# Patient Record
Sex: Male | Born: 1960 | Race: White | Hispanic: No | Smoking: Current every day smoker
Health system: Southern US, Community
[De-identification: ages and names within clinical notes are randomized; demographics above are authoritative.]

## PROBLEM LIST (undated history)

## (undated) DIAGNOSIS — F319 Bipolar disorder, unspecified: Secondary | ICD-10-CM

## (undated) DIAGNOSIS — B182 Chronic viral hepatitis C: Secondary | ICD-10-CM

## (undated) DIAGNOSIS — F329 Major depressive disorder, single episode, unspecified: Secondary | ICD-10-CM

## (undated) DIAGNOSIS — B1920 Unspecified viral hepatitis C without hepatic coma: Secondary | ICD-10-CM

## (undated) DIAGNOSIS — I10 Essential (primary) hypertension: Secondary | ICD-10-CM

## (undated) DIAGNOSIS — E119 Type 2 diabetes mellitus without complications: Secondary | ICD-10-CM

## (undated) DIAGNOSIS — T1491XA Suicide attempt, initial encounter: Secondary | ICD-10-CM

## (undated) DIAGNOSIS — Z5900 Homelessness unspecified: Secondary | ICD-10-CM

## (undated) DIAGNOSIS — F32A Depression, unspecified: Secondary | ICD-10-CM

## (undated) DIAGNOSIS — G8929 Other chronic pain: Secondary | ICD-10-CM

## (undated) DIAGNOSIS — I456 Pre-excitation syndrome: Secondary | ICD-10-CM

## (undated) HISTORY — PX: CHEST SURGERY: SHX595

## (undated) HISTORY — PX: BACK SURGERY: SHX140

## (undated) HISTORY — PX: TONSILLECTOMY: SUR1361

## (undated) HISTORY — PX: RHINOPLASTY: SHX2354

---

## 2007-04-12 ENCOUNTER — Ambulatory Visit: Payer: Self-pay | Admitting: Gastroenterology

## 2007-09-08 ENCOUNTER — Ambulatory Visit: Payer: Self-pay | Admitting: Internal Medicine

## 2008-02-22 ENCOUNTER — Emergency Department (HOSPITAL_COMMUNITY): Admission: EM | Admit: 2008-02-22 | Discharge: 2008-02-22 | Payer: Self-pay | Admitting: Emergency Medicine

## 2008-04-17 ENCOUNTER — Emergency Department (HOSPITAL_COMMUNITY): Admission: EM | Admit: 2008-04-17 | Discharge: 2008-04-17 | Payer: Self-pay | Admitting: Emergency Medicine

## 2009-04-25 ENCOUNTER — Inpatient Hospital Stay (HOSPITAL_COMMUNITY): Admission: EM | Admit: 2009-04-25 | Discharge: 2009-04-27 | Payer: Self-pay | Admitting: Emergency Medicine

## 2009-04-27 ENCOUNTER — Ambulatory Visit: Payer: Self-pay | Admitting: Psychiatry

## 2009-04-27 ENCOUNTER — Inpatient Hospital Stay (HOSPITAL_COMMUNITY): Admission: AD | Admit: 2009-04-27 | Discharge: 2009-05-04 | Payer: Self-pay | Admitting: Psychiatry

## 2010-02-23 ENCOUNTER — Inpatient Hospital Stay (HOSPITAL_COMMUNITY): Admission: AC | Admit: 2010-02-23 | Discharge: 2010-02-25 | Payer: Self-pay

## 2010-03-25 ENCOUNTER — Encounter: Admission: RE | Admit: 2010-03-25 | Discharge: 2010-03-25 | Payer: Self-pay | Admitting: Neurosurgery

## 2010-04-16 ENCOUNTER — Ambulatory Visit: Payer: Self-pay | Admitting: Psychiatry

## 2010-05-03 ENCOUNTER — Encounter
Admission: RE | Admit: 2010-05-03 | Discharge: 2010-05-03 | Payer: Self-pay | Source: Home / Self Care | Admitting: Nephrology

## 2010-06-02 ENCOUNTER — Emergency Department (HOSPITAL_COMMUNITY)
Admission: EM | Admit: 2010-06-02 | Discharge: 2010-06-03 | Disposition: A | Discharge status: Other Acute Inpatient Hospital | Payer: Medicare Other | Source: Home / Self Care | Admitting: Emergency Medicine

## 2010-06-03 ENCOUNTER — Inpatient Hospital Stay (HOSPITAL_COMMUNITY)
Admission: EM | Admit: 2010-06-03 | Discharge: 2010-06-07 | Payer: Self-pay | Source: Home / Self Care | Attending: Psychiatry | Admitting: Psychiatry

## 2010-06-05 ENCOUNTER — Ambulatory Visit (HOSPITAL_COMMUNITY)
Admission: RE | Admit: 2010-06-05 | Discharge: 2010-06-05 | Payer: Self-pay | Source: Home / Self Care | Attending: Psychiatry | Admitting: Psychiatry

## 2010-06-05 LAB — GLUCOSE, CAPILLARY
Glucose-Capillary: 153 mg/dL — ABNORMAL HIGH (ref 70–99)
Glucose-Capillary: 131 mg/dL — ABNORMAL HIGH (ref 70–99)
Glucose-Capillary: 117 mg/dL — ABNORMAL HIGH (ref 70–99)
Glucose-Capillary: 168 mg/dL — ABNORMAL HIGH (ref 70–99)

## 2010-06-06 LAB — GLUCOSE, CAPILLARY
Glucose-Capillary: 98 mg/dL (ref 70–99)
Glucose-Capillary: 131 mg/dL — ABNORMAL HIGH (ref 70–99)
Glucose-Capillary: 143 mg/dL — ABNORMAL HIGH (ref 70–99)
Glucose-Capillary: 132 mg/dL — ABNORMAL HIGH (ref 70–99)

## 2010-06-17 LAB — GLUCOSE, CAPILLARY
Glucose-Capillary: 108 mg/dL — ABNORMAL HIGH (ref 70–99)
Glucose-Capillary: 129 mg/dL — ABNORMAL HIGH (ref 70–99)

## 2010-06-22 ENCOUNTER — Encounter: Payer: Self-pay | Admitting: Neurosurgery

## 2010-08-12 LAB — CBC
HCT: 44.7 % (ref 39.0–52.0)
Hemoglobin: 15.9 g/dL (ref 13.0–17.0)
MCH: 35.7 pg — ABNORMAL HIGH (ref 26.0–34.0)
MCHC: 35.6 g/dL (ref 30.0–36.0)
MCV: 100.2 fL — ABNORMAL HIGH (ref 78.0–100.0)
Platelets: 220 10*3/uL (ref 150–400)
RBC: 4.46 MIL/uL (ref 4.22–5.81)
RDW: 12.3 % (ref 11.5–15.5)
WBC: 8.4 10*3/uL (ref 4.0–10.5)

## 2010-08-12 LAB — DIFFERENTIAL
Basophils Relative: 1 % (ref 0–1)
Eosinophils Absolute: 0.1 10*3/uL (ref 0.0–0.7)
Eosinophils Relative: 1 % (ref 0–5)
Lymphs Abs: 3.2 10*3/uL (ref 0.7–4.0)
Monocytes Relative: 8 % (ref 3–12)
Neutrophils Relative %: 53 % (ref 43–77)

## 2010-08-12 LAB — ETHANOL: Alcohol, Ethyl (B): 170 mg/dL — ABNORMAL HIGH (ref 0–10)

## 2010-08-12 LAB — COMPREHENSIVE METABOLIC PANEL
ALT: 47 U/L (ref 0–53)
AST: 59 U/L — ABNORMAL HIGH (ref 0–37)
Alkaline Phosphatase: 58 U/L (ref 39–117)
CO2: 25 mEq/L (ref 19–32)
Calcium: 9.4 mg/dL (ref 8.4–10.5)
GFR calc Af Amer: >60 mL/min (ref >60)
GFR calc non Af Amer: >60 mL/min (ref >60)
Glucose, Bld: 83 mg/dL (ref 70–99)
Potassium: 3.9 mEq/L (ref 3.5–5.1)
Sodium: 143 mEq/L (ref 135–145)
Total Protein: 8.7 g/dL — ABNORMAL HIGH (ref 6.0–8.3)

## 2010-08-12 LAB — RAPID URINE DRUG SCREEN, HOSP PERFORMED
Amphetamines: NOT DETECTED
Barbiturates: NOT DETECTED
Benzodiazepines: NOT DETECTED
Cocaine: POSITIVE — AB
Opiates: NOT DETECTED
Tetrahydrocannabinol: NOT DETECTED

## 2010-08-12 LAB — GLUCOSE, CAPILLARY
Glucose-Capillary: 103 mg/dL — ABNORMAL HIGH (ref 70–99)
Glucose-Capillary: 107 mg/dL — ABNORMAL HIGH (ref 70–99)
Glucose-Capillary: 118 mg/dL — ABNORMAL HIGH (ref 70–99)
Glucose-Capillary: 134 mg/dL — ABNORMAL HIGH (ref 70–99)
Glucose-Capillary: 178 mg/dL — ABNORMAL HIGH (ref 70–99)
Glucose-Capillary: 181 mg/dL — ABNORMAL HIGH (ref 70–99)
Glucose-Capillary: 80 mg/dL (ref 70–99)
Glucose-Capillary: 94 mg/dL (ref 70–99)

## 2010-08-15 LAB — URINALYSIS, MICROSCOPIC ONLY
Nitrite: NEGATIVE
Protein, ur: NEGATIVE mg/dL
Urobilinogen, UA: 1 mg/dL (ref 0.0–1.0)

## 2010-08-15 LAB — GLUCOSE, CAPILLARY
Glucose-Capillary: 119 mg/dL — ABNORMAL HIGH (ref 70–99)
Glucose-Capillary: 131 mg/dL — ABNORMAL HIGH (ref 70–99)
Glucose-Capillary: 92 mg/dL (ref 70–99)
Glucose-Capillary: 99 mg/dL (ref 70–99)

## 2010-08-15 LAB — TYPE AND SCREEN
ABO/RH(D): A POS
Antibody Screen: NEGATIVE

## 2010-08-15 LAB — CBC
HCT: 44.8 % (ref 39.0–52.0)
Hemoglobin: 14.6 g/dL (ref 13.0–17.0)
MCH: 34.6 pg — ABNORMAL HIGH (ref 26.0–34.0)
MCHC: 35.3 g/dL (ref 30.0–36.0)
MCV: 98 fL (ref 78.0–100.0)
Platelets: 211 10*3/uL (ref 150–400)
RBC: 4.3 MIL/uL (ref 4.22–5.81)
RDW: 14.1 % (ref 11.5–15.5)

## 2010-08-15 LAB — BASIC METABOLIC PANEL
CO2: 27 mEq/L (ref 19–32)
Calcium: 8.6 mg/dL (ref 8.4–10.5)
GFR calc Af Amer: >60 mL/min (ref >60)
GFR calc non Af Amer: >60 mL/min (ref >60)
Sodium: 140 mEq/L (ref 135–145)

## 2010-08-15 LAB — COMPREHENSIVE METABOLIC PANEL
Alkaline Phosphatase: 68 U/L (ref 39–117)
BUN: 3 mg/dL — ABNORMAL LOW (ref 6–23)
Calcium: 8.9 mg/dL (ref 8.4–10.5)
Creatinine, Ser: 0.87 mg/dL (ref 0.4–1.5)
Glucose, Bld: 141 mg/dL — ABNORMAL HIGH (ref 70–99)
Potassium: 3.3 mEq/L — ABNORMAL LOW (ref 3.5–5.1)
Total Protein: 6.9 g/dL (ref 6.0–8.3)

## 2010-08-15 LAB — PROTIME-INR
INR: 0.91 (ref 0.00–1.49)
Prothrombin Time: 12.5 seconds (ref 11.6–15.2)

## 2010-08-15 LAB — APTT: aPTT: 23 seconds — ABNORMAL LOW (ref 24–37)

## 2010-09-03 LAB — BASIC METABOLIC PANEL
BUN: 7 mg/dL (ref 6–23)
CO2: 30 mEq/L (ref 19–32)
Calcium: 9.6 mg/dL (ref 8.4–10.5)
Glucose, Bld: 149 mg/dL — ABNORMAL HIGH (ref 70–99)
Sodium: 140 mEq/L (ref 135–145)

## 2010-09-03 LAB — GLUCOSE, CAPILLARY
Glucose-Capillary: 135 mg/dL — ABNORMAL HIGH (ref 70–99)
Glucose-Capillary: 137 mg/dL — ABNORMAL HIGH (ref 70–99)
Glucose-Capillary: 163 mg/dL — ABNORMAL HIGH (ref 70–99)
Glucose-Capillary: 256 mg/dL — ABNORMAL HIGH (ref 70–99)
Glucose-Capillary: 376 mg/dL — ABNORMAL HIGH (ref 70–99)

## 2010-09-04 LAB — GLUCOSE, CAPILLARY
Glucose-Capillary: 115 mg/dL — ABNORMAL HIGH (ref 70–99)
Glucose-Capillary: 127 mg/dL — ABNORMAL HIGH (ref 70–99)
Glucose-Capillary: 159 mg/dL — ABNORMAL HIGH (ref 70–99)
Glucose-Capillary: 170 mg/dL — ABNORMAL HIGH (ref 70–99)
Glucose-Capillary: 170 mg/dL — ABNORMAL HIGH (ref 70–99)
Glucose-Capillary: 82 mg/dL (ref 70–99)
Glucose-Capillary: 89 mg/dL (ref 70–99)
Glucose-Capillary: 162 mg/dL — ABNORMAL HIGH (ref 70–99)
Glucose-Capillary: 180 mg/dL — ABNORMAL HIGH (ref 70–99)
Glucose-Capillary: 199 mg/dL — ABNORMAL HIGH (ref 70–99)
Glucose-Capillary: 242 mg/dL — ABNORMAL HIGH (ref 70–99)
Glucose-Capillary: 261 mg/dL — ABNORMAL HIGH (ref 70–99)
Glucose-Capillary: 305 mg/dL — ABNORMAL HIGH (ref 70–99)

## 2010-09-04 LAB — CBC
MCV: 92.8 fL (ref 78.0–100.0)
MCV: 93.3 fL (ref 78.0–100.0)
Platelets: 124 10*3/uL — ABNORMAL LOW (ref 150–400)
Platelets: 145 10*3/uL — ABNORMAL LOW (ref 150–400)
RBC: 5.04 MIL/uL (ref 4.22–5.81)
RDW: 12.6 % (ref 11.5–15.5)
WBC: 7.8 10*3/uL (ref 4.0–10.5)
WBC: 7.8 10*3/uL (ref 4.0–10.5)
WBC: 9.1 10*3/uL (ref 4.0–10.5)

## 2010-09-04 LAB — DIFFERENTIAL
Basophils Relative: 1 % (ref 0–1)
Lymphocytes Relative: 30 % (ref 12–46)
Lymphs Abs: 2.3 10*3/uL (ref 0.7–4.0)
Monocytes Relative: 6 % (ref 3–12)
Neutro Abs: 4.9 10*3/uL (ref 1.7–7.7)
Neutrophils Relative %: 62 % (ref 43–77)

## 2010-09-04 LAB — BASIC METABOLIC PANEL
BUN: 16 mg/dL (ref 6–23)
BUN: 22 mg/dL (ref 6–23)
Calcium: 8.7 mg/dL (ref 8.4–10.5)
Chloride: 108 mEq/L (ref 96–112)
Chloride: 99 mEq/L (ref 96–112)
Creatinine, Ser: 1.08 mg/dL (ref 0.4–1.5)
Creatinine, Ser: 1.49 mg/dL (ref 0.4–1.5)
Creatinine, Ser: 1.65 mg/dL — ABNORMAL HIGH (ref 0.4–1.5)
GFR calc Af Amer: 54 mL/min — ABNORMAL LOW (ref >60)
GFR calc non Af Amer: 45 mL/min — ABNORMAL LOW (ref >60)
GFR calc non Af Amer: >60 mL/min (ref >60)
Glucose, Bld: 77 mg/dL (ref 70–99)
Sodium: 124 mEq/L — ABNORMAL LOW (ref 135–145)

## 2010-09-04 LAB — RAPID URINE DRUG SCREEN, HOSP PERFORMED
Amphetamines: NOT DETECTED
Tetrahydrocannabinol: NOT DETECTED

## 2010-09-04 LAB — ETHANOL: Alcohol, Ethyl (B): <5 mg/dL (ref 0–10)

## 2010-10-15 NOTE — Letter (Signed)
September 08, 2007    Billy [name]   RE:  Billy [name], Billy [name]  MRN:  [id number]  /  DOB:  1960/08/12   Dear Dr. Celene Skeen:   I thank you for referring Mr. Billy [name] back for EP evaluation.  As  you know, he is a very pleasant middle-aged man with a history of  multiple medical problems, including WPW syndrome and SVT.  The patient  underwent electrophysiologic study and catheter ablation by my partner,  Dr. Graciela Husbands in 1995.  At that time, he was found to have inducible AV node  reentry tachycardia, as well as an accessory pathway which was not  contributing to his SVT.  He underwent successful ablation of his AV  node reentry tachycardia with no therapy being directed at his left  lateral accessory pathway.  Since then, the patient has had no  significant arrhythmias.  He has rare palpitations, but has otherwise  not had any significant arrhythmia.  He does have very rare  nonexertional chest pain, but he notes that he exert himself without any  particular difficulty.  The patient has had multiple social problems and  was actually homeless for 1 month before finding shelter.  He has a  history of diabetes which is non-insulin dependent.  He has a history of  dyslipidemia.  He has a history of hepatitis C with no treatment.  He  has a history of bipolar disorder, but he stopped taking lithium in the  past.  He has a history of multisubstance abuse, including cocaine and  marijuana with positive drug testing back in 2007.  The patient lives  presently in a government housing project.  He drinks 3-4 beers on the  weekends, but not like he used to in terms of drinking heavily.   MEDICATIONS:  1. Byetta 5 mg daily.  2. Levitra p.r.n.  3. Prevacid p.r.n.  4. Welchol 625 t.i.d.  5. Avalide 300/25.  6. Gabapentin 800 b.i.d.   REVIEW OF SYSTEMS:  Really unremarkable, except for rare palpitations,  seasonal allergic rhinitis symptoms and atypical chest pain, not related  to exertion and lasting  seconds.   PHYSICAL EXAMINATION:  GENERAL:  He is a pleasant 50 year old man who is  in no acute distress.  VITAL SIGNS:  Blood pressure was 109/71, pulse was 53 and regular,  respirations were 18.  Weight was 194 pounds.  HEENT:  Normocephalic and atraumatic.  Pupils equal and round.  Oropharynx was moist.  Sclerae anicteric.  NECK:  Revealed no jugulovenous distention.  No thyromegaly.  Trachea is  midline.  The carotids are 2+ and symmetric.  LUNGS:  Clear bilaterally to auscultation.  No wheezes, rales or rhonchi  are present.  There is no increased work of breathing.  CARDIOVASCULAR:  Revealed a regular rate and rhythm with normal S1-S2.  No murmurs, rubs or gallops present.  PMI was not enlarged or laterally  displaced.  ABDOMEN:  Soft, nontender, nondistended.  There is no organomegaly.  Bowel sounds present.  No rebound or guarding.  EXTREMITIES:  Demonstrated no cyanosis, clubbing or edema.  Pulses were  2+ and symmetric.  NEUROLOGIC:  Alert and oriented x3.  Cranial nerves  are intact.  Strength is 5/5 and symmetric.   His EKG demonstrates sinus bradycardia with no evidence of any  ventricular preexcitation.   IMPRESSION:  1. Palpitations with minimal symptoms.  2. History of supraventricular tachycardia, status post ablation.  3. Atypical chest pain with symptoms not consistent with any coronary  disease.   DISCUSSION:  Billy [name] is stable and I have recommended a period of  watchful waiting.  There is no indication for any additional cardiac  workup at the present time.    Sincerely,      Doylene Canning. Ladona Ridgel, MD  Electronically Signed    GWT/MedQ  DD: 09/08/2007  DT: 09/08/2007  Job #: [id number]   CC:    Billy [name]

## 2011-03-03 LAB — CBC
HCT: 46.6
Hemoglobin: 16
MCHC: 34.4
MCV: 98.8
Platelets: 170
RDW: 14.8

## 2011-03-03 LAB — BASIC METABOLIC PANEL
BUN: 6
CO2: 31
Chloride: 104
Glucose, Bld: 90
Potassium: 4.5
Sodium: 142

## 2011-03-03 LAB — DIFFERENTIAL
Basophils Absolute: 0.1
Eosinophils Absolute: 0.1
Eosinophils Relative: 2
Lymphocytes Relative: 50 — ABNORMAL HIGH
Monocytes Absolute: 0.6

## 2011-08-29 ENCOUNTER — Emergency Department (HOSPITAL_COMMUNITY)
Admission: EM | Admit: 2011-08-29 | Discharge: 2011-08-30 | Disposition: A | Discharge status: Other Acute Inpatient Hospital | Payer: Medicare Other | Source: Home / Self Care | Attending: Emergency Medicine | Admitting: Emergency Medicine

## 2011-08-29 ENCOUNTER — Encounter (HOSPITAL_COMMUNITY): Payer: Self-pay | Admitting: *Deleted

## 2011-08-29 DIAGNOSIS — E119 Type 2 diabetes mellitus without complications: Secondary | ICD-10-CM | POA: Insufficient documentation

## 2011-08-29 DIAGNOSIS — F329 Major depressive disorder, single episode, unspecified: Secondary | ICD-10-CM

## 2011-08-29 DIAGNOSIS — M549 Dorsalgia, unspecified: Secondary | ICD-10-CM | POA: Insufficient documentation

## 2011-08-29 DIAGNOSIS — F3130 Bipolar disorder, current episode depressed, mild or moderate severity, unspecified: Secondary | ICD-10-CM | POA: Insufficient documentation

## 2011-08-29 DIAGNOSIS — R739 Hyperglycemia, unspecified: Secondary | ICD-10-CM

## 2011-08-29 DIAGNOSIS — E669 Obesity, unspecified: Secondary | ICD-10-CM | POA: Insufficient documentation

## 2011-08-29 DIAGNOSIS — F319 Bipolar disorder, unspecified: Secondary | ICD-10-CM

## 2011-08-29 HISTORY — DX: Pre-excitation syndrome: I45.6

## 2011-08-29 HISTORY — DX: Depression, unspecified: F32.A

## 2011-08-29 HISTORY — DX: Chronic viral hepatitis C: B18.2

## 2011-08-29 HISTORY — DX: Major depressive disorder, single episode, unspecified: F32.9

## 2011-08-29 HISTORY — DX: Other chronic pain: G89.29

## 2011-08-29 HISTORY — DX: Essential (primary) hypertension: I10

## 2011-08-29 HISTORY — DX: Bipolar disorder, unspecified: F31.9

## 2011-08-29 LAB — COMPREHENSIVE METABOLIC PANEL
ALT: 27 U/L (ref 0–53)
Alkaline Phosphatase: 98 U/L (ref 39–117)
CO2: 22 mEq/L (ref 19–32)
Chloride: 106 mEq/L (ref 96–112)
GFR calc Af Amer: >90 mL/min (ref >90)
Glucose, Bld: 274 mg/dL — ABNORMAL HIGH (ref 70–99)
Potassium: 3.8 mEq/L (ref 3.5–5.1)
Sodium: 138 mEq/L (ref 135–145)
Total Bilirubin: 0.4 mg/dL (ref 0.3–1.2)
Total Protein: 7.3 g/dL (ref 6.0–8.3)

## 2011-08-29 LAB — CBC
HCT: 41 % (ref 39.0–52.0)
MCHC: 35.6 g/dL (ref 30.0–36.0)
MCV: 86.5 fL (ref 78.0–100.0)
Platelets: 184 10*3/uL (ref 150–400)
RDW: 12.6 % (ref 11.5–15.5)
WBC: 7 10*3/uL (ref 4.0–10.5)

## 2011-08-29 LAB — GLUCOSE, CAPILLARY

## 2011-08-29 MED ORDER — LITHIUM CARBONATE ER 300 MG PO TBCR
300.0000 mg | EXTENDED_RELEASE_TABLET | ORAL | Status: AC
  Administered 2011-08-29: 300 mg via ORAL
  Filled 2011-08-29: qty 1

## 2011-08-29 MED ORDER — INSULIN GLARGINE 100 UNIT/ML ~~LOC~~ SOLN
30.0000 [IU] | Freq: Every day | SUBCUTANEOUS | Status: DC
  Administered 2011-08-29: 30 [IU] via SUBCUTANEOUS
  Filled 2011-08-29: qty 1

## 2011-08-29 MED ORDER — LITHIUM CARBONATE ER 300 MG PO TBCR: 300.0000 mg | EXTENDED_RELEASE_TABLET | Freq: Two times a day (BID) | ORAL | Status: DC

## 2011-08-29 MED ORDER — HYDROCODONE-ACETAMINOPHEN 5-325 MG PO TABS
1.0000 | ORAL_TABLET | Freq: Four times a day (QID) | ORAL | Status: DC | PRN
  Administered 2011-08-29: 1 via ORAL
  Filled 2011-08-29: qty 1

## 2011-08-29 MED ORDER — TRAZODONE HCL 100 MG PO TABS
200.0000 mg | ORAL_TABLET | ORAL | Status: AC
  Administered 2011-08-29: 200 mg via ORAL
  Filled 2011-08-29: qty 2

## 2011-08-29 MED ORDER — CITALOPRAM HYDROBROMIDE 40 MG PO TABS: 40.0000 mg | ORAL_TABLET | Freq: Every day | ORAL | Status: DC

## 2011-08-29 MED ORDER — TRAZODONE HCL 100 MG PO TABS: 200.0000 mg | ORAL_TABLET | Freq: Every day | ORAL | Status: DC

## 2011-08-29 MED ORDER — CITALOPRAM HYDROBROMIDE 40 MG PO TABS
40.0000 mg | ORAL_TABLET | ORAL | Status: AC
  Administered 2011-08-29: 40 mg via ORAL
  Filled 2011-08-29: qty 1

## 2011-08-29 NOTE — BH Assessment (Signed)
Assessment Note   Reynold Bowen, assessment counselor at [facility], requested Pt be submitted to [facility] for admission. Gave clinical report to Dr. Orson Aloe who accepted Pt to [facility]. Consulted with Faythe Dingwall, [facility] who assigned Pt to room 302-2. Communicated disposition to [employer].   Patsy Baltimore, Harlin Rain 08/29/2011 9:44 PM

## 2011-08-29 NOTE — Progress Notes (Signed)
Consulted to obtain medication assistance. However, patient has Medicare; therefore, we cannot help him through the [employer]-fund. Social work needs to be consulted with regard to substance abuse and placement issues.

## 2011-08-29 NOTE — ED Notes (Signed)
Report givwen to [employer]

## 2011-08-29 NOTE — ED Notes (Signed)
Patient states he is DM and he usually takes 30 Units of Lantus at night but has not had his Lantus in a week. Patient also states he has chronic back pain and is hurting at this time. EDP advised.

## 2011-08-29 NOTE — Progress Notes (Signed)
I attempted to contact the ED SW but the phone was not on. I contacted operator for on-call SW whom I paged and gave number for the attending nurse.

## 2011-08-29 NOTE — ED Provider Notes (Signed)
History     CSN: [id number]  Arrival date & time 08/29/11  1200   First MD Initiated Contact with Patient 08/29/11 1347      Chief Complaint  Patient presents with  . Medical Clearance    (Consider location/radiation/quality/duration/timing/severity/associated sxs/prior treatment) HPI Comments: 51 yo male presents wanting to be admitted to behavioral health. 1 week ago was released from jail after being charged for attempted murder (was in for 14 months). Had sporadic treatment for his medical conditions, including insulin, lithium, celexa and trazadone. Has been living in a motel for the past week since his release and has been unable to get meds or get an appointment with his PCP (Dr. Bascom Levels). Denies suicidal thoughts or homicidal thoughts, but wants to be admitted to get his meds back intact as well as get a place to stay. Has lower back pain that is chronic but no new injuries.  The history is provided by the patient.    Past Medical History  Diagnosis Date  . Diabetes mellitus   . Bipolar 1 disorder   . Hepatitis C carrier   . WPW (Wolff-Parkinson-White syndrome)   . Hypertension   . Depression   . Chronic pain     Past Surgical History  Procedure Date  . Back surgery   . Rhinoplasty   . Chest surgery   . Tonsillectomy     No family history on file.  History  Substance Use Topics  . Smoking status: Current Some Day Smoker  . Smokeless tobacco: Not on file  . Alcohol Use: Yes      Review of Systems  Constitutional: Positive for unexpected weight change (gained 80 pounds while in jail due to diet and poor exercise). Negative for fever and chills.  HENT: Negative for congestion and rhinorrhea.   Respiratory: Negative for cough and shortness of breath.   Cardiovascular: Negative for chest pain and leg swelling.  Gastrointestinal: Negative for nausea, vomiting, abdominal pain, constipation and blood in stool.  Genitourinary: Negative for dysuria and decreased  urine volume.  Musculoskeletal: Positive for back pain.  Neurological: Negative for numbness and headaches.  Psychiatric/Behavioral: Positive for dysphoric mood. Negative for suicidal ideas, behavioral problems, confusion and self-injury.  All other systems reviewed and are negative.    Allergies  Review of patient's allergies indicates no known allergies.  Home Medications   Current Outpatient Rx  Name Route Sig Dispense Refill  . HYDROCODONE-ACETAMINOPHEN 10-325 MG PO TABS Oral Take 1 tablet by mouth 3 (three) times daily.    Marland Kitchen OMEPRAZOLE 40 MG PO CPDR Oral Take 40 mg by mouth 2 (two) times daily.      BP 140/90  Pulse 90  Temp 98 F (36.7 C)  Resp 20  SpO2 98%  Physical Exam  Nursing note and vitals reviewed. Constitutional: He is oriented to person, place, and time. He appears well-developed and well-nourished.       Well appearing, is laying in exam bed reading a book.  HENT:  Head: Normocephalic and atraumatic.  Right Ear: External ear normal.  Left Ear: External ear normal.  Nose: Nose normal.  Neck: Neck supple.  Cardiovascular: Normal rate, regular rhythm, normal heart sounds and intact distal pulses.   Pulmonary/Chest: Effort normal and breath sounds normal. No respiratory distress. He has no wheezes. He has no rales.  Abdominal: Soft. He exhibits no distension and no mass. There is no tenderness. There is no rebound and no guarding.       obese  Musculoskeletal: He exhibits no edema.  Lymphadenopathy:    He has no cervical adenopathy.  Neurological: He is alert and oriented to person, place, and time.  Skin: Skin is warm and dry.    ED Course  Procedures (including critical care time)  Labs Reviewed  COMPREHENSIVE METABOLIC PANEL - Abnormal; Notable for the following:    Glucose, Bld 274 (*)    All other components within normal limits  GLUCOSE, CAPILLARY - Abnormal; Notable for the following:    Glucose-Capillary 211 (*)    All other components  within normal limits  CBC  ETHANOL  URINE RAPID DRUG SCREEN (HOSP PERFORMED)   No results found.   1. Hyperglycemia   2. Depression       MDM  51 yo male seeking voluntary committment due to social and psych issues. No SI/HI. Social work consulted due to his homelessness, as well as telepsych for med placement. Psychiatrist believes patient should be admitted. Patient's meds started per psych recommendations, as well as insulin for his hyperglycemia. No evidence of DKA. Medically cleared.        Pricilla Loveless, MD 08/29/11 2306
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
## 2011-08-29 NOTE — BH Assessment (Signed)
Assessment Note   Billy [name] was accepted to [facility] by Dr Dan Humphreys to Dr Catha Brow room 302-2 with the request that patient be given a UDS before being transferred.  Reported disposition to Dr Hedwig Morton who ordered the UDS but requested the patient transport not be held until the labs have resulted.  Completed support paperwork and faxed to [facility].  Pt will be transported by security.    Disposition:  Disposition Disposition of Patient: Inpatient treatment program Type of inpatient treatment program: Adult (Accepted to [facility] 302-2 By Dr Dan Humphreys to [facility])  On Site Evaluation by:   Reviewed with Physician:     Steward Ros 08/29/2011 9:58 PM

## 2011-08-29 NOTE — ED Notes (Signed)
Dinner tray ordered. Carb Mod.

## 2011-08-29 NOTE — ED Notes (Addendum)
Patient reports he was in jail for 14 mth.  He states he lost his apartment and meds.  Patient is out of his medications.  He is here for that.  He states he wants to go to behavioral health to get his meds straightened out.  He states he has hx of bipolar with hx of suicide attemtps.  He denies si today.  He states he needs a controlled environment while he gets straightened out

## 2011-08-29 NOTE — ED Notes (Signed)
Patient completed telepsych.  

## 2011-08-29 NOTE — ED Provider Notes (Addendum)
Pt wishing to be started on psychiatric medications.on various vacations while in prison. He was recently released from prison and is currently on no medications. He states "I feel fine" suicidal or homicidal ideations. Medical history remarkable for diabetes off of all medications since release from prison has appointment to see Dr.Frazier next week for evaluation. Dr. Janey Greaser his primary care Dr.. patient reports he was on Lantus insulin 30 units each night  Doug Sou, MD 08/29/11 1618 Patient became argumentative stating if he is discharged "I don't know what I'll do". He feels that he is not safe at home. Inpatient psychiatric care recommended by specialist on-call from psychiatry  Doug Sou, MD 08/30/11 0010  11:50 PM patient is pleasant cooperative alert Glasgow Coma Score 15 ambulatory difficulty. Stable to transfer to [facility]. Results for orders placed during the hospital encounter of 08/29/11  CBC      Component Value Range   WBC 7.0  4.0 - 10.5 (K/uL)   RBC 4.74  4.22 - 5.81 (MIL/uL)   Hemoglobin 14.6  13.0 - 17.0 (g/dL)   HCT 16.1  09.6 - 04.5 (%)   MCV 86.5  78.0 - 100.0 (fL)   MCH 30.8  26.0 - 34.0 (pg)   MCHC 35.6  30.0 - 36.0 (g/dL)   RDW 40.9  81.1 - 91.4 (%)   Platelets 184  150 - 400 (K/uL)  COMPREHENSIVE METABOLIC PANEL      Component Value Range   Sodium 138  135 - 145 (mEq/L)   Potassium 3.8  3.5 - 5.1 (mEq/L)   Chloride 106  96 - 112 (mEq/L)   CO2 22  19 - 32 (mEq/L)   Glucose, Bld 274 (*) 70 - 99 (mg/dL)   BUN 14  6 - 23 (mg/dL)   Creatinine, Ser 7.82  0.50 - 1.35 (mg/dL)   Calcium 9.3  8.4 - 95.6 (mg/dL)   Total Protein 7.3  6.0 - 8.3 (g/dL)   Albumin 3.8  3.5 - 5.2 (g/dL)   AST 24  0 - 37 (U/L)   ALT 27  0 - 53 (U/L)   Alkaline Phosphatase 98  39 - 117 (U/L)   Total Bilirubin 0.4  0.3 - 1.2 (mg/dL)   GFR calc non Af Amer >90  >90 (mL/min)   GFR calc Af Amer >90  >90 (mL/min)  ETHANOL      Component Value Range   Alcohol, Ethyl (B) <11  0 -  11 (mg/dL)  GLUCOSE, CAPILLARY      Component Value Range   Glucose-Capillary 211 (*) 70 - 99 (mg/dL)   Comment 1 Notify RN     Comment 2 Documented in Chart    URINE RAPID DRUG SCREEN (HOSP PERFORMED)      Component Value Range   Opiates PENDING  NONE DETECTED    Cocaine NONE DETECTED  NONE DETECTED    Benzodiazepines NONE DETECTED  NONE DETECTED    Amphetamines NONE DETECTED  NONE DETECTED    Tetrahydrocannabinol NONE DETECTED  NONE DETECTED    Barbiturates NONE DETECTED  NONE DETECTED    No results found.   Doug Sou, MD 08/30/11 Burna Mortimer  Doug Sou, MD 08/30/11 2130

## 2011-08-29 NOTE — ED Notes (Signed)
Pt stated that he has not had his medications since he left jail a couple of months ago. He came to the ED to find housing and structure. He stated that he cannot take care of himself. No SI, hallucinations, or HI. Will continue to monitor.

## 2011-08-29 NOTE — ED Notes (Signed)
Patient resting and reading a book. NAD at this time.

## 2011-08-29 NOTE — ED Notes (Signed)
Carollee Herter, Social worker called and stated that she was at [employer] and she could only fax over a list of homeless shelters for the patient. Fax received.

## 2011-08-30 ENCOUNTER — Inpatient Hospital Stay (HOSPITAL_COMMUNITY)
Admission: AD | Admit: 2011-08-30 | Discharge: 2011-09-03 | DRG: 885 | Disposition: A | Discharge status: Home / Self Care | Payer: Medicare Other | Source: Ambulatory Visit | Attending: Psychiatry | Admitting: Psychiatry

## 2011-08-30 ENCOUNTER — Encounter (HOSPITAL_COMMUNITY): Payer: Self-pay

## 2011-08-30 DIAGNOSIS — I456 Pre-excitation syndrome: Secondary | ICD-10-CM | POA: Diagnosis present

## 2011-08-30 DIAGNOSIS — F17200 Nicotine dependence, unspecified, uncomplicated: Secondary | ICD-10-CM

## 2011-08-30 DIAGNOSIS — Z590 Homelessness: Secondary | ICD-10-CM

## 2011-08-30 DIAGNOSIS — B182 Chronic viral hepatitis C: Secondary | ICD-10-CM

## 2011-08-30 DIAGNOSIS — Z794 Long term (current) use of insulin: Secondary | ICD-10-CM

## 2011-08-30 DIAGNOSIS — F602 Antisocial personality disorder: Secondary | ICD-10-CM

## 2011-08-30 DIAGNOSIS — I10 Essential (primary) hypertension: Secondary | ICD-10-CM

## 2011-08-30 DIAGNOSIS — F3130 Bipolar disorder, current episode depressed, mild or moderate severity, unspecified: Principal | ICD-10-CM

## 2011-08-30 DIAGNOSIS — F1110 Opioid abuse, uncomplicated: Secondary | ICD-10-CM

## 2011-08-30 DIAGNOSIS — E119 Type 2 diabetes mellitus without complications: Secondary | ICD-10-CM

## 2011-08-30 DIAGNOSIS — F329 Major depressive disorder, single episode, unspecified: Secondary | ICD-10-CM | POA: Diagnosis present

## 2011-08-30 DIAGNOSIS — M549 Dorsalgia, unspecified: Secondary | ICD-10-CM

## 2011-08-30 DIAGNOSIS — F1190 Opioid use, unspecified, uncomplicated: Secondary | ICD-10-CM

## 2011-08-30 DIAGNOSIS — F319 Bipolar disorder, unspecified: Secondary | ICD-10-CM | POA: Diagnosis present

## 2011-08-30 DIAGNOSIS — G8929 Other chronic pain: Secondary | ICD-10-CM

## 2011-08-30 LAB — RAPID URINE DRUG SCREEN, HOSP PERFORMED
Amphetamines: NOT DETECTED
Benzodiazepines: NOT DETECTED
Opiates: POSITIVE — AB
Tetrahydrocannabinol: NOT DETECTED

## 2011-08-30 LAB — GLUCOSE, CAPILLARY
Glucose-Capillary: 227 mg/dL — ABNORMAL HIGH (ref 70–99)
Glucose-Capillary: 231 mg/dL — ABNORMAL HIGH (ref 70–99)
Glucose-Capillary: 235 mg/dL — ABNORMAL HIGH (ref 70–99)
Glucose-Capillary: 180 mg/dL — ABNORMAL HIGH (ref 70–99)

## 2011-08-30 MED ORDER — MAGNESIUM HYDROXIDE 400 MG/5ML PO SUSP: 30.0000 mL | Freq: Every day | ORAL | Status: DC | PRN

## 2011-08-30 MED ORDER — IBUPROFEN 600 MG PO TABS
600.0000 mg | ORAL_TABLET | Freq: Four times a day (QID) | ORAL | Status: DC | PRN
  Administered 2011-08-31 – 2011-09-02 (×4): 600 mg via ORAL
  Filled 2011-08-30 (×4): qty 1

## 2011-08-30 MED ORDER — ALUM & MAG HYDROXIDE-SIMETH 200-200-20 MG/5ML PO SUSP: 30.0000 mL | ORAL | Status: DC | PRN

## 2011-08-30 MED ORDER — ACETAMINOPHEN 325 MG PO TABS: 650.0000 mg | ORAL_TABLET | Freq: Four times a day (QID) | ORAL | Status: DC | PRN

## 2011-08-30 MED ORDER — CITALOPRAM HYDROBROMIDE 40 MG PO TABS
40.0000 mg | ORAL_TABLET | Freq: Every day | ORAL | Status: DC
  Administered 2011-08-30 – 2011-09-03 (×5): 40 mg via ORAL
  Filled 2011-08-30 (×7): qty 1

## 2011-08-30 MED ORDER — INSULIN ASPART 100 UNIT/ML ~~LOC~~ SOLN: 0.0000 [IU] | Freq: Every day | SUBCUTANEOUS | Status: DC

## 2011-08-30 MED ORDER — INSULIN ASPART 100 UNIT/ML ~~LOC~~ SOLN
4.0000 [IU] | Freq: Three times a day (TID) | SUBCUTANEOUS | Status: DC
  Administered 2011-08-30 – 2011-09-03 (×12): 4 [IU] via SUBCUTANEOUS

## 2011-08-30 MED ORDER — METAXALONE 800 MG PO TABS
800.0000 mg | ORAL_TABLET | Freq: Four times a day (QID) | ORAL | Status: DC | PRN
  Administered 2011-08-31 – 2011-09-03 (×6): 800 mg via ORAL
  Filled 2011-08-30 (×6): qty 1

## 2011-08-30 MED ORDER — INSULIN GLARGINE 100 UNIT/ML ~~LOC~~ SOLN
30.0000 [IU] | Freq: Every day | SUBCUTANEOUS | Status: DC
  Administered 2011-08-30 – 2011-09-02 (×4): 30 [IU] via SUBCUTANEOUS

## 2011-08-30 MED ORDER — INSULIN ASPART 100 UNIT/ML ~~LOC~~ SOLN
0.0000 [IU] | Freq: Three times a day (TID) | SUBCUTANEOUS | Status: DC
  Administered 2011-08-30: 5 [IU] via SUBCUTANEOUS
  Administered 2011-08-31 (×2): 3 [IU] via SUBCUTANEOUS
  Administered 2011-08-31: 5 [IU] via SUBCUTANEOUS
  Administered 2011-09-01: 3 [IU] via SUBCUTANEOUS
  Administered 2011-09-01: 5 [IU] via SUBCUTANEOUS
  Administered 2011-09-01 – 2011-09-02 (×2): 3 [IU] via SUBCUTANEOUS
  Administered 2011-09-02: 5 [IU] via SUBCUTANEOUS
  Administered 2011-09-02 – 2011-09-03 (×2): 3 [IU] via SUBCUTANEOUS
  Administered 2011-09-03: 5 [IU] via SUBCUTANEOUS

## 2011-08-30 MED ORDER — LITHIUM CARBONATE 300 MG PO CAPS
300.0000 mg | ORAL_CAPSULE | Freq: Two times a day (BID) | ORAL | Status: DC
  Administered 2011-08-30 – 2011-09-03 (×9): 300 mg via ORAL
  Filled 2011-08-30 (×11): qty 1

## 2011-08-30 MED ORDER — TRAZODONE HCL 100 MG PO TABS
200.0000 mg | ORAL_TABLET | Freq: Every day | ORAL | Status: DC
  Administered 2011-08-30 – 2011-09-02 (×4): 200 mg via ORAL
  Filled 2011-08-30 (×5): qty 2

## 2011-08-30 NOTE — Progress Notes (Signed)
Patient ID: Billy [name], male   DOB: 30-Jun-1960, 51 y.o.   MRN: [id number] The patient spent the entire evening in bed. Stated he has had several back surgeries and wants a doctor to order Oxycodone for him. Refused offer of Tylenol and a heat pack.  Requested diabetic ginger ale. Got out of bed and walked to dayroom for snack. Choose appropriate snacks for his diabetic diet.

## 2011-08-30 NOTE — Progress Notes (Signed)
Patient ID: Jaedon Siler, male   DOB: [birth year], 51 y.o.   MRN: [id number]  [facility] Notes:  (Counselor/Nursing/MHT/Case Management/Adjunct)  08/30/2011 1:15 PM  Type of Therapy:  Group Therapy, Dance/Movement Therapy   Participation Level:  Did Not Attend     Rhunette Croft

## 2011-08-30 NOTE — Tx Team (Signed)
Initial Interdisciplinary Treatment Plan  PATIENT STRENGTHS: (choose at least two) Ability for insight Capable of independent living  PATIENT STRESSORS: Financial difficulties Loss of housing* Medication change or noncompliance   PROBLEM LIST: Problem List/Patient Goals Date to be addressed Date deferred Reason deferred Estimated date of resolution  Depression      Medications restarted                                                 DISCHARGE CRITERIA:  Ability to meet basic life and health needs Adequate post-discharge living arrangements Verbal commitment to aftercare and medication compliance  PRELIMINARY DISCHARGE PLAN: Placement in alternative living arrangements  PATIENT/FAMIILY INVOLVEMENT: This treatment plan has been presented to and reviewed with the patient, Billy [name], and/or family member.  The patient and family have been given the opportunity to ask questions and make suggestions.  Billy [name] 08/30/2011, 2:26 AM

## 2011-08-30 NOTE — ED Provider Notes (Signed)
I have personally seen and examined the patient.  I have discussed the plan of care with the resident.  I have reviewed the documentation on PMH/FH/Soc. History.  I have reviewed the documentation of the resident and agree.  Doug Sou, MD 08/30/11 [id number]

## 2011-08-30 NOTE — Progress Notes (Signed)
Patient reports depression, needing help with finding placement and medications restarted. Patient reports he was recently released from jail 1 week ago after being cleared of charges. Patient reports he lost his apartment while in jail and his belongings. Patient reported chronic back pain which he rated a 7 and declined tylenol and ibuprofen offered upon admission. Denies si/hi/a/v hallucinations. Patient had medications in his possessions and one of them was oxycodone which was filled on 08/27/2011 for 90 pill, patient had 25 upon admission, he reports that his ex-wife holds on to them for him. Patient oriented to unit, meal and drink offered, safety maintained with 15 min. checks, will continue to monitor.

## 2011-08-30 NOTE — [facility] Suicide Risk Assessment (Signed)
Suicide Risk Assessment  Admission Assessment     Demographic factors:  Assessment Details Time of Assessment: Admission Current Mental Status:     Objective: Appearance: Neat   Psychomotor Activity: Normal   Eye Contact:: Fair   Speech: Clear and Coherent   Volume: Normal   Mood:depressed   Affect: Constricted   Thought Process: clear rational goal oriented   Orientation: Full   Thought Content: no AVH/psychosis   Suicidal Thoughts: No   Homicidal Thoughts: No   Judgement: Intact   Insight: Present    Loss Factors:  Loss Factors: Decrease in vocational status;Financial problems / change in socioeconomic status, homeless, disabled Historical Factors:  Historical Factors: Prior suicide attempts Risk Reduction Factors:  Risk Reduction Factors: Sense of responsibility to family  CLINICAL FACTORS:   Alcohol/Substance Abuse/Dependencies  COGNITIVE FEATURES THAT CONTRIBUTE TO RISK:  Closed-mindedness    SUICIDE RISK:   Mild:  Suicidal ideation of limited frequency, intensity, duration, and specificity.  There are no identifiable plans, no associated intent, mild dysphoria and related symptoms, good self-control (both objective and subjective assessment), few other risk factors, and identifiable protective factors, including available and accessible social support.  PLAN OF CARE:  Admit for safety & stabilization-homeless  Need his DM addressed-prefers Lantus  Will try to verify meds given while in jail  Restart Celexa trazadone and Lithium-already done      Billy [name] 08/30/2011, 11:18 AM

## 2011-08-30 NOTE — H&P (Signed)
  Pt was seen by me today and I agree with the key elements documented in H&P.  

## 2011-08-30 NOTE — Progress Notes (Signed)
Patient ID: Billy [name], male   DOB: [birth year], 51 y.o.   MRN: [id number]   Pt has been very flat and depressed on the unit today, pt was in the bed most of the day. Pt did not attend groups, nor did he engage in treatment. Pt reported that he needed to be seen by the doctor so that he could be put back on all of his medication, Mallie Darting made aware. Pt is diabetic and has had some high blood sugars, currently he is on Lantus insulin at bedtime. Pt reported being negative SI/HI, no AH/VH  Noted.
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
## 2011-08-31 LAB — GLUCOSE, CAPILLARY: Glucose-Capillary: 196 mg/dL — ABNORMAL HIGH (ref 70–99)

## 2011-08-31 NOTE — Progress Notes (Signed)
[facility] Adult Inpatient Family/Significant Other Suicide Prevention Education  Suicide Prevention Education:  Patient Refusal for Family/Significant Other Suicide Prevention Education: The patient Billy [name] has refused to provide written consent for family/significant other to be provided Family/Significant Other Suicide Prevention Education during admission and/or prior to discharge.  Physician notified.   Pt. accepted information on suicide prevention, warning signs to look for with suicide and crisis line numbers to use. The pt. agreed to call crisis line numbers if having warning signs or having thoughts of suicide.   [facility] 08/31/2011, 1:59 PM
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
## 2011-08-31 NOTE — Progress Notes (Signed)
[facility] Group Notes:  (Counselor/Nursing/MHT/Case Management/Adjunct)  08/31/2011 1:15 PM  Type of Therapy:  Group Therapy, Dance/Movement Therapy   Participation Level:  Did Not Attend    Billy [name]  

## 2011-08-31 NOTE — Progress Notes (Signed)
  Billy [name] is a 51 y.o. male [id number] Mar 19, 1961  08/30/2011 Principal Problem:  *Depression Active Problems:  Opiate use  Bipolar affective disorder  Diabetes mellitus  WPW (Wolff-Parkinson-White syndrome)   Mental Status: Alert & oriented mood is sullen entitled denies SI/HI/AVH    Subjective/Objective: C/o back pain- told him that no opioids would be prescribed here. Explained to him that he had misrepresented himself in the ED claiming that he couldn't get access to his [facility]. Indeed he had a Rx on 3/27 for 90 Hydrocodone and when admitted to [facility] had 25 left. Probably sold them to raise cash as he has not exhibted withdrawal.     Filed Vitals:   08/30/11 2116  BP: 141/96  Pulse: 79  Temp:   Resp:     Lab Results:   BMET    Component Value Date/Time   NA 138 08/29/2011 1222   K 3.8 08/29/2011 1222   CL 106 08/29/2011 1222   CO2 22 08/29/2011 1222   GLUCOSE 274* 08/29/2011 1222   BUN 14 08/29/2011 1222   CREATININE 0.88 08/29/2011 1222   CALCIUM 9.3 08/29/2011 1222   GFRNONAA >90 08/29/2011 1222   GFRAA >90 08/29/2011 1222    Medications:  Scheduled:     . citalopram  40 mg Oral Daily  . insulin aspart  0-15 Units Subcutaneous TID WC  . insulin aspart  0-5 Units Subcutaneous QHS  . insulin aspart  4 Units Subcutaneous TID WC  . insulin glargine  30 Units Subcutaneous QHS  . lithium carbonate  300 mg Oral BID  . traZODone  200 mg Oral QHS     PRN Meds acetaminophen, alum & mag hydroxide-simeth, ibuprofen, magnesium hydroxide, metaxalone  Plan: SS insulin coverage was initiated yesterday            Is a placement issue.              Mahayla Haddaway,MICKIE D. 08/31/2011

## 2011-08-31 NOTE — Progress Notes (Signed)
Patient ID: Billy [name], male   DOB: 1960/09/09, 51 y.o.   MRN: [id number]  Pt continues to be very flat and depressed on the unit. Pt does not attend groups and does not engage in treatment. Pt sleeps all day despite staff encouraging him to attend groups. Pt reports being negative SI/HI, no AH/VH noted.

## 2011-08-31 NOTE — Progress Notes (Signed)
Pt has been in room much of the afternoon, has stated that he is in bed much of the day because his back hurts too much, has spoken about the need to get his medications adjusted and stated he did not receive any of them while he was in jail, pt has received all medications without incident, will continue to monitor

## 2011-09-01 LAB — GLUCOSE, CAPILLARY
Glucose-Capillary: 192 mg/dL — ABNORMAL HIGH (ref 70–99)
Glucose-Capillary: 171 mg/dL — ABNORMAL HIGH (ref 70–99)

## 2011-09-01 NOTE — Progress Notes (Signed)
Lying quietly in bed with eyes closed.  Skin warm and dry to touch.  Exhibiting normal sleep behavior.  Safety checks are being conducted Q 15 minutes.

## 2011-09-01 NOTE — Progress Notes (Signed)
Pt has been in bed all day and has not been to any groups.  He rated his depression a 5 denied any hopelessness and rated his anxiety a 4 on his self-inventory.  He denies any S/H ideation or A/V hallucinations.  He denied any substance abuse issues for he has been in jail for past 14 months and has been staying in a motel for past few weeks.  He claims he didn't receive any medications while in jail and that when he left here the last time he went straight to jail.  He wants help finding a place to live and he wants to get a car.  He reports he is on disability and that is how he plans to pay for a place to live and his car.  He has been up for meals and back to bed.  He does c/o chronic back pain and during the first med pass he refused any pain meds at that time.  He stated,"I will ask for something later on" he did get something at 1156 and went on down for lunch.  He reported some relief but was still in pain.  He understands why he can't get opiates here.

## 2011-09-01 NOTE — Discharge Planning (Signed)
Did not meet with patient today, will receive case management assessment tomorrow.  Per State Regulation 482.30  This chart was reviewed for medical necessity with respect to the patient's Admission/Duration of stay.   Next review due:  09/04/11  Ambrose Mantle, LCSW  09/01/2011  4:53 PM

## 2011-09-02 DIAGNOSIS — F1110 Opioid abuse, uncomplicated: Secondary | ICD-10-CM

## 2011-09-02 DIAGNOSIS — Z5900 Homelessness unspecified: Secondary | ICD-10-CM

## 2011-09-02 DIAGNOSIS — F319 Bipolar disorder, unspecified: Secondary | ICD-10-CM

## 2011-09-02 DIAGNOSIS — F602 Antisocial personality disorder: Secondary | ICD-10-CM

## 2011-09-02 DIAGNOSIS — F3130 Bipolar disorder, current episode depressed, mild or moderate severity, unspecified: Principal | ICD-10-CM

## 2011-09-02 LAB — GLUCOSE, CAPILLARY
Glucose-Capillary: 153 mg/dL — ABNORMAL HIGH (ref 70–99)
Glucose-Capillary: 196 mg/dL — ABNORMAL HIGH (ref 70–99)

## 2011-09-02 MED ORDER — PANTOPRAZOLE SODIUM 40 MG PO TBEC
40.0000 mg | DELAYED_RELEASE_TABLET | Freq: Every day | ORAL | Status: DC
  Administered 2011-09-02 – 2011-09-03 (×2): 40 mg via ORAL
  Filled 2011-09-02 (×3): qty 1

## 2011-09-02 MED ORDER — NAPROXEN 500 MG PO TABS
500.0000 mg | ORAL_TABLET | Freq: Two times a day (BID) | ORAL | Status: DC
  Administered 2011-09-02 – 2011-09-03 (×2): 500 mg via ORAL
  Filled 2011-09-02 (×4): qty 1

## 2011-09-02 MED ORDER — NAPROXEN SODIUM 550 MG PO TABS
550.0000 mg | ORAL_TABLET | Freq: Two times a day (BID) | ORAL | Status: DC
  Filled 2011-09-02 (×2): qty 1

## 2011-09-02 NOTE — Progress Notes (Signed)
Patient ID: Billy [name], male   DOB: 1961/04/06, 51 y.o.   MRN: [id number]   Did attempt to see patient yesterday and the patient was sleeping soundly.  Reviewed the chart and no problems noted.  No changes in medication.  Will continue at this time with current plan of care. Rona Ravens. Anmarie Fukushima PAC

## 2011-09-02 NOTE — Progress Notes (Signed)
  09/02/2011         Time: 1415      Group Topic/Focus: The focus of this group is on discussing various styles of communication and communicating assertively using 'I' (feeling) statements.  Participation Level: Active  Participation Quality: Intrusive and Redirectable  Affect: Excited  Cognitive: Oriented   Additional Comments: Patient inappropriate at times, mentioning things like he was glad to get off attempted murder charges last week and that he likes guillotines.   Illyanna Petillo 09/02/2011 4:00 PM
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
## 2011-09-02 NOTE — Progress Notes (Signed)
The Writer was performing 15-minute safety rounds during the 1315 Group Session and overheard the Patient and his roommate talking in their room. Pt's roommate asked the Patient "So you just tell them a different lie every day you're here?" and the Patient responded "Yeah, it sure beats paying $45 a day for a hotel room." The Writer then entered the room to inquire why neither Pt was in Group. The Patient responded that his back hurt and that he couldn't sit in a chair, ruling out this and future group therapy sessions.

## 2011-09-02 NOTE — Progress Notes (Signed)
Pt was up upon first assessment and did attend the discharge planning group but he did not attend another group until 1415.  This was after this nurse told pt that if he wasn't active in his care ie going to groups then he would probably be discharged d/t his lack of motivation for treatment.  He stated,"I can't afford $300.00 a week for a motel room will they really discharge me to a motel if I don't have a boarding room in line"  Informed him chances were that he would be.  He came out of the 1415 group was joking and laughing with peers and staff and stated,"see I went to group and I promise I will continue to go to all of them now"  He later came back after talking with Lloyd Huger PA and stated,"If my lithium level is good tomorrow I will be discharged"  Asked how he felt about that and he said,"It's okay I will be ready"  He went on to say that he appreciated everything that staff has done for him and he was "glad" we were here when he needed help.  His new order was for some naproxen and protonix along with the lab draw for his lithium level.  He did request his skelaxin and ibuprofen at 1205 for his chronic back pain that he listed at a 7 see pain tab for his f/u.

## 2011-09-02 NOTE — Progress Notes (Signed)
Patient ID: Billy [name], male   DOB: 02-19-1961, 51 y.o.   MRN: [id number] Patient was not as engaging or as intrusive as he was previous days. Informed the writer that the previous nurse had spoken to him and encouraged to attend groups. Writer asked pt if he's been taking meds or if anyone had commented on actions the night before. "No, they didn't say anything. But I know that was stupid. I must have bumped my head". Stated that he'll just use that "excuse". "I'm not gonna do anything like that again" Support and encouragement was offered.

## 2011-09-02 NOTE — Progress Notes (Signed)
Patient ID: Billy [name], male   DOB: 1960-10-08, 51 y.o.   MRN: [id number] Pt was very childlike and intrusive. Initially blamed the prison staff for his adm to [facility], then after several minutes of conversation stated that he was homeless. Stated he's trying to get help until his "section 8 kicks in". Pt attempted to put his Skelaxin in his pocket or drop it. Then when caught stated, "I just wanted to see if you were paying attention." Pt was encouraged to stop acting silly and get serious about his treatment. Was also informed that incident would be reported to his Dr.

## 2011-09-02 NOTE — Progress Notes (Signed)
[facility] Group Notes:  (Counselor/Nursing/MHT/Case Management/Adjunct)  09/02/2011 2:59 PM   Type of Therapy:  Processing Group at 11:00 am  Participation Level:  Did Not Attend  [facility] Group Notes:  (Counselor/Nursing/MHT/Case Management/Adjunct)  09/02/2011 2:59 PM   Type of Therapy:  Counseling Group at 1:15 pm  Participation Level:  Did Not Attend  Ronda Fairly, LCSWA 09/02/2011 2:59 PM

## 2011-09-02 NOTE — Discharge Planning (Signed)
Billy [name] attended AM group, good participation.  Pt verbalized that his plan is to find a boarding house in [place]. Pt stated that his second option is to possibly get a motel room but he doesn't want to have to do that due to the cost. Pt stated that he has been sober for 15 months and feels as though he doesn't need any rehab or outpatient therapy treatment. Pt reported that he has family/children who reside in [place] but will consider other boarding houses in nearby areas such as [place].  Vague about what brought him to hospital.

## 2011-09-02 NOTE — Progress Notes (Signed)
In reviewing notes patient has not attended any of the 6 available counseling groups on unit since he was admitted. Patient also refused consent to contact any one in his support group for counseling staff to provide suicide prevention education.  Weekend staff provided patient with suicide prevention information and resource numbers to contact for assistance.  Billy [name] 09/02/2011 3:12 PM

## 2011-09-02 NOTE — Progress Notes (Signed)
[facility] Group Notes:  (Counselor/Nursing/MHT/Case Management/Adjunct)  09/02/2011 2:58 PM   Type of Therapy:  Processing Group at 11:00 am on 09/01/11  Participation Level:  Did not attend  [facility] Group Notes:  (Counselor/Nursing/MHT/Case Management/Adjunct)  09/02/2011 2:58 PM   Type of Therapy:  Counseling Group at 1:15 pm on 09/01/11  Participation Level:  Did Not Attend   Ronda Fairly, LCSWA 09/02/2011 2:58 PM

## 2011-09-03 DIAGNOSIS — F319 Bipolar disorder, unspecified: Secondary | ICD-10-CM

## 2011-09-03 DIAGNOSIS — F329 Major depressive disorder, single episode, unspecified: Secondary | ICD-10-CM

## 2011-09-03 LAB — GLUCOSE, CAPILLARY
Glucose-Capillary: 173 mg/dL — ABNORMAL HIGH (ref 70–99)
Glucose-Capillary: 206 mg/dL — ABNORMAL HIGH (ref 70–99)

## 2011-09-03 MED ORDER — TRAZODONE HCL 100 MG PO TABS: 200.0000 mg | ORAL_TABLET | Freq: Every day | ORAL | Status: DC

## 2011-09-03 MED ORDER — LITHIUM CARBONATE 300 MG PO CAPS: ORAL_CAPSULE | ORAL | Status: DC

## 2011-09-03 MED ORDER — CITALOPRAM HYDROBROMIDE 40 MG PO TABS: 40.0000 mg | ORAL_TABLET | Freq: Every day | ORAL | Status: DC

## 2011-09-03 MED ORDER — LITHIUM CARBONATE 300 MG PO CAPS
300.0000 mg | ORAL_CAPSULE | ORAL | Status: DC
  Filled 2011-09-03 (×2): qty 1

## 2011-09-03 MED ORDER — LITHIUM CARBONATE 300 MG PO CAPS: 300.0000 mg | ORAL_CAPSULE | Freq: Two times a day (BID) | ORAL | Status: DC

## 2011-09-03 NOTE — Progress Notes (Signed)
[facility] Group Notes:  (Counselor/Nursing/MHT/Case Management/Adjunct)  09/03/2011 12:53 PM   Type of Therapy:  Processing Group at 11:00 am  Participation Level:  Did Not Attend  Ronda Fairly, LCSWA 09/03/2011 12:53 PM

## 2011-09-03 NOTE — [facility] Suicide Risk Assessment (Signed)
Suicide Risk Assessment  Discharge Assessment     Demographic factors:  Male;Divorced or widowed;Caucasian;Low socioeconomic status;Unemployed    Current Mental Status Per Nursing Assessment::   On Admission:    At Discharge:     Current Mental Status Per Physician:  Loss Factors: Decrease in vocational status;Financial problems / change in socioeconomic status  Historical Factors: Prior suicide attempts  Risk Reduction Factors:      Continued Clinical Symptoms:  Bipolar Disorder:   Depressive phase  Discharge Diagnoses:   AXIS I:  Bipolar, mixed AXIS II:  Deferred AXIS III:   Past Medical History  Diagnosis Date  . Diabetes mellitus   . Bipolar 1 disorder   . Hepatitis C carrier   . WPW (Wolff-Parkinson-White syndrome)   . Hypertension   . Depression   . Chronic pain    AXIS IV:  housing problems and problems related to social environment AXIS V:  51-60 moderate symptoms  Cognitive Features That Contribute To Risk:  Thought constriction (tunnel vision)    Suicide Risk:  Minimal: No identifiable suicidal ideation.  Patients presenting with no risk factors but with morbid ruminations; may be classified as minimal risk based on the severity of the depressive symptoms  Plan Of Care/Follow-up recommendations:  Activity:  Pt is going to housing authority or get section 8.  He has just spent 14 mos in jail  and lost his place.  Other:  Pt sill go to PCP April 24 and will need to get a Lithium level. He expects to receive Rxs for Bipolar DO from PCP.     Chennel Olivos 09/03/2011, 1:44 PM

## 2011-09-03 NOTE — Progress Notes (Signed)
[facility] Group Notes:  (Counselor/Nursing/MHT/Case Management/Adjunct)  09/03/2011 3:51 PM  Type of Therapy:  1:15PM Group Therapy  Participation Level:  Minimal  Participation Quality:  Appropriate and Attentive  Affect:  Appropriate  Cognitive:  Alert and Appropriate  Insight:  Limited  Engagement in Group:  Limited  Engagement in Therapy:  Limited  Modes of Intervention:  Clarification, Education, Support and Exploration  Summary of Progress/Problems: Patient reported the emotions he felt when he was incarcerated. Patient stated he was strained by not receiving his medications. Patient reports he feels relieved to be here at the hospital receiving help. Patient seemed to relate well to peers during group discussion.   Wilmon Arms 09/03/2011, 3:51 PM

## 2011-09-03 NOTE — Progress Notes (Signed)
Pt was discharged home today.  He denied any S/I H/I or A/V hallucinations.    He was given f/u appointment, rx, sample medications, and hotline info booklet.  He voiced understanding to all instructions provided.  He declined the need for smoking cessation materials.  

## 2011-09-03 NOTE — Treatment Plan (Signed)
Interdisciplinary Treatment Plan Update (Adult)  Date: 09/03/2011  Time Reviewed: 10:07 AM   Progress in Treatment: Attending groups: Yes Participating in groups: Yes Taking medication as prescribed: Yes Tolerating medication: Yes   Family/Significant othe contact made:   Patient understands diagnosis:  Yes  As evidenced by asking for help with depression Discussing patient identified problems/goals with staff:  Yes  See below Medical problems stabilized or resolved:  Yes Denies suicidal/homicidal ideation: Yes  In tx team Issues/concerns per patient self-inventory:  None noted Other:  New problem(s) identified: N/A  Reason for Continuation of Hospitalization: Other; describe D/C today  Interventions implemented related to continuation of hospitalization:   Additional comments:  Estimated length of stay: D/C today  Discharge Plan: Go stay in hotel, follow up Dr Bascom Levels and Ozzie Hoyle goal(s): N/A  Review of initial/current patient goals per problem list:   1.  Goal(s):Eliminate SI  Met:  Yes  Target date:4/3  As evidenced ZO:XWRU report in tx team  2.  Goal (s):Stabilize mood with medication  Met:  Yes  Target date:4/3  As evidenced by: Restarting meds   3.  Goal(s): Decrease depression  Met:  Yes  Target date:4/3  As evidenced EA:VWUJ report in tx team  4.  Goal(s):  Met:  Yes  Target date:  As evidenced by:  Attendees: Patient:  Billy [name] 09/03/2011 10:07 AM  Family:     Physician:  Verne Spurr 09/03/2011 10:07 AM   Nursing: Robbie Louis   09/03/2011 10:07 AM   Case Manager:  Richelle Ito, LCSW 09/03/2011 10:07 AM   Counselor:  Ronda Fairly, LCSWA 09/03/2011 10:07 AM   Other:     Other:     Other:     Other:      Scribe for Treatment Team:   Daryel Gerald B, 09/03/2011 10:07 AM

## 2011-09-03 NOTE — Progress Notes (Signed)
[facility] Case Management Discharge Plan:  Will you be returning to the same living situation after discharge: No. At discharge, do you have transportation home?:Yes,  will call friend for ride Do you have the ability to pay for your medications:Yes,  insurance  Interagency Information:     Release of information consent forms completed and in the chart;  Patient's signature needed at discharge.  Patient to Follow up at:  Follow-up Information    Follow up with [facility] on 09/05/2011. (Walk in Fri AM at 8:00 to be opened for services)    Contact information:   [address] [place]  [phone]         Patient denies SI/HI:   Yes,  yes    Safety Planning and Suicide Prevention discussed:  Yes,  yes  Barrier to discharge identified:No.  Summary and Recommendations:   Billy [name] 09/03/2011, 10:49 AM

## 2011-09-03 NOTE — Discharge Summary (Signed)
Physician Discharge Summary Note  Patient:  Billy [name] is an 51 y.o., male MRN:  [id number] DOB:  03-04-1961 Patient phone:  [phone] (home)  Patient address:   [place] [address],   Date of Admission:  08/30/2011 Date of Discharge: 474/08/2011  Reason for Admission: opiate abuse  Discharge Diagnoses: Principal Problem:  *Depression Active Problems:  Bipolar affective disorder  Opiate use  Diabetes mellitus  WPW (Wolff-Parkinson-White syndrome)  Homeless  Opiate abuse, episodic  Bipolar disorder  Axis Diagnosis:  AXIS I: Bipolar, mixed  AXIS II: Deferred  AXIS III:  Past Medical History   Diagnosis  Date   .  Diabetes mellitus    .  Bipolar 1 disorder    .  Hepatitis C carrier    .  WPW (Wolff-Parkinson-White syndrome)    .  Hypertension    .  Depression    .  Chronic pain     AXIS IV: housing problems and problems related to social environment  AXIS V: 51-60 moderate symptoms   Level of Care:  Outpatient  Hospital Course:      Eulogio Ditch was admitted for detox from alcohol, cocaine, opiates, benzodiazepines and crisis management.  He/she was treated with the standard Librium/Clonodin protocol.  Medical problems were identified and treated.  Home medication was restarted as appropriate.     Improvement was monitored by COWS scores and patient's daily report of withdrawal symptom reduction. Emotional and mental status was monitored by daily self inventory reports completed by the patient and clinical staff.      The patient was evaluated by the treatment team for stability and plans for continued recovery upon discharge. He was offered further treatment options upon discharge including Residential, IOP, and Outpatient treatment.  The patient's motivation was an integral factor for scheduling further treatment.  Employment, transportation, bed availability, health status, family support, and any pending legal issues were also considered.  The patient elected  outpatient treatment.    Upon completion of detox the patient was both mentally and medically stable for discharge.    Consults:  None  Significant Diagnostic Studies:  none  Discharge Vitals:   Blood pressure 131/97, pulse 79, temperature 97.5 F (36.4 C), temperature source Oral, resp. rate 20, height 6' (1.829 m), weight 117.482 kg (259 lb), SpO2 95.00%.  Mental Status Exam: See Mental Status Examination and Suicide Risk Assessment completed by Attending Physician prior to discharge.  Discharge destination:  Home  Is patient on multiple antipsychotic therapies at discharge:  No   Has Patient had three or more failed trials of antipsychotic monotherapy by history:  No Recommended Plan for Multiple Antipsychotic Therapies: not applicable   Discharge Orders    Future Orders Please Complete By Expires   Diet - low sodium heart healthy      Increase activity slowly      Discharge instructions      Comments:   Take all medications as prescribed. Follow up with your PCP for medications and labs.  You will need a repeat lithium level in 1 week.     Medication List  As of 09/03/2011  1:49 PM   STOP taking these medications         HYDROcodone-acetaminophen 10-325 MG per tablet         TAKE these medications      Indication    citalopram 40 MG tablet   Commonly known as: CELEXA   Take 1 tablet (40 mg total) by mouth daily.  Indication: Depression, Social Anxiety Disorder      lithium carbonate 300 MG capsule   Take 1 capsule (300 mg total) by mouth 2 (two) times daily.    Indication: Manic-Depression      omeprazole 40 MG capsule   Commonly known as: PRILOSEC   Take 40 mg by mouth 2 (two) times daily.       traZODone 100 MG tablet   Commonly known as: DESYREL   Take 2 tablets (200 mg total) by mouth at bedtime.            Follow-up Information    Follow up with [facility] on 09/05/2011. (Walk in Fri AM at 8:00 to be opened for services)    Contact information:   [address]  [place]  [facility]  [phone]         Follow-up recommendations:  See above  Comments:  none  Signed: Makaila Windle 09/03/2011, 1:49 PM

## 2011-09-04 NOTE — Progress Notes (Signed)
Cosigned by Carney Bern, LCSWA 4/4/20138:52 PM

## 2011-09-05 NOTE — Progress Notes (Signed)
Patient Discharge Instructions:  Psychiatric Admission Assessment Note Provided,  09/04/2011 After Visit Summary (AVS) Provided,  09/04/2011 Face Sheet Provided, 09/04/2011 Faxed/Sent to the Next Level Care provider:  09/04/2011 Sent Suicide Risk Assessment - Discharge Assessment 09/04/2011  Faxed to [facility] @ [phone]  Wandra Scot, 09/05/2011, 3:07 PM

## 2011-09-17 ENCOUNTER — Ambulatory Visit
Admission: RE | Admit: 2011-09-17 | Discharge: 2011-09-17 | Disposition: A | Discharge status: Home / Self Care | Payer: Medicare Other | Source: Ambulatory Visit | Attending: Nephrology | Admitting: Nephrology

## 2011-09-17 ENCOUNTER — Other Ambulatory Visit: Payer: Self-pay | Admitting: Nephrology

## 2011-09-17 DIAGNOSIS — Z0000 Encounter for general adult medical examination without abnormal findings: Secondary | ICD-10-CM

## 2012-09-30 ENCOUNTER — Emergency Department (HOSPITAL_COMMUNITY)
Admission: EM | Admit: 2012-09-30 | Discharge: 2012-09-30 | Disposition: A | Discharge status: Home / Self Care | Payer: Medicare Other | Attending: Emergency Medicine | Admitting: Emergency Medicine

## 2012-09-30 ENCOUNTER — Encounter (HOSPITAL_COMMUNITY): Payer: Self-pay | Admitting: Emergency Medicine

## 2012-09-30 DIAGNOSIS — E119 Type 2 diabetes mellitus without complications: Secondary | ICD-10-CM | POA: Insufficient documentation

## 2012-09-30 DIAGNOSIS — Z79899 Other long term (current) drug therapy: Secondary | ICD-10-CM | POA: Insufficient documentation

## 2012-09-30 DIAGNOSIS — B182 Chronic viral hepatitis C: Secondary | ICD-10-CM | POA: Insufficient documentation

## 2012-09-30 DIAGNOSIS — F17200 Nicotine dependence, unspecified, uncomplicated: Secondary | ICD-10-CM | POA: Insufficient documentation

## 2012-09-30 DIAGNOSIS — M545 Low back pain, unspecified: Secondary | ICD-10-CM | POA: Insufficient documentation

## 2012-09-30 DIAGNOSIS — IMO0002 Reserved for concepts with insufficient information to code with codable children: Secondary | ICD-10-CM | POA: Insufficient documentation

## 2012-09-30 DIAGNOSIS — M5416 Radiculopathy, lumbar region: Secondary | ICD-10-CM

## 2012-09-30 DIAGNOSIS — I10 Essential (primary) hypertension: Secondary | ICD-10-CM | POA: Insufficient documentation

## 2012-09-30 DIAGNOSIS — Z8679 Personal history of other diseases of the circulatory system: Secondary | ICD-10-CM | POA: Insufficient documentation

## 2012-09-30 DIAGNOSIS — G8929 Other chronic pain: Secondary | ICD-10-CM | POA: Insufficient documentation

## 2012-09-30 DIAGNOSIS — F319 Bipolar disorder, unspecified: Secondary | ICD-10-CM | POA: Insufficient documentation

## 2012-09-30 MED ORDER — PREDNISONE 20 MG PO TABS: ORAL_TABLET | ORAL | Status: DC

## 2012-09-30 MED ORDER — OXYCODONE-ACETAMINOPHEN 5-325 MG PO TABS
2.0000 | ORAL_TABLET | Freq: Once | ORAL | Status: AC
  Administered 2012-09-30: 2 via ORAL
  Filled 2012-09-30: qty 2

## 2012-09-30 MED ORDER — PREDNISONE 20 MG PO TABS
60.0000 mg | ORAL_TABLET | Freq: Once | ORAL | Status: AC
  Administered 2012-09-30: 60 mg via ORAL
  Filled 2012-09-30: qty 3

## 2012-09-30 MED ORDER — GABAPENTIN 300 MG PO CAPS: 300.0000 mg | ORAL_CAPSULE | Freq: Three times a day (TID) | ORAL | Status: DC

## 2012-09-30 MED ORDER — OXYCODONE-ACETAMINOPHEN 5-325 MG PO TABS: 1.0000 | ORAL_TABLET | Freq: Four times a day (QID) | ORAL | Status: DC | PRN

## 2012-09-30 MED ORDER — ESOMEPRAZOLE MAGNESIUM 40 MG PO CPDR: 40.0000 mg | DELAYED_RELEASE_CAPSULE | Freq: Every day | ORAL | Status: DC

## 2012-09-30 NOTE — ED Provider Notes (Signed)
History     CSN: [id number]  Arrival date & time 09/30/12  1347   First MD Initiated Contact with Patient 09/30/12 1359      Chief Complaint  Patient presents with  . Back Pain    (Consider location/radiation/quality/duration/timing/severity/associated sxs/prior treatment) HPI Comments: 52 year old male with a past medical history of chronic back pain presents to the emergency department complaining of worsening low back pain x2 days after catching himself from falling last night. Patient states he was walking when he tripped and tried to catch himself before he fell which she was able to do, however "pull" in his lower back. States he's had back problems for the past 25 years, usually managed by Dr. Bascom Levels who no longer practices. He was supposed to be seen by pain clinic, however referral was never made. States this pain is similar to his prior exacerbations in the past. Admits to pain radiating down his right leg which is normal for him. Denies loss of control of bowels or bladder or saddle anesthesia. Also requesting refills on Nexium and Neurontin as he is in the process of getting a new PCP with [facility].  Patient is a 52 y.o. male presenting with back pain. The history is provided by the patient.  Back Pain   Past Medical History  Diagnosis Date  . Diabetes mellitus   . Bipolar 1 disorder   . Hepatitis C carrier   . WPW (Wolff-Parkinson-White syndrome)   . Hypertension   . Depression   . Chronic pain     Past Surgical History  Procedure Laterality Date  . Back surgery    . Rhinoplasty    . Chest surgery    . Tonsillectomy      No family history on file.  History  Substance Use Topics  . Smoking status: Current Some Day Smoker  . Smokeless tobacco: Not on file  . Alcohol Use: Yes      Review of Systems  Musculoskeletal: Positive for back pain.  All other systems reviewed and are negative.    Allergies  Review of patient's allergies indicates  no known allergies.  Home Medications   Current Outpatient Rx  Name  Route  Sig  Dispense  Refill  . EXPIRED: citalopram (CELEXA) 40 MG tablet   Oral   Take 1 tablet (40 mg total) by mouth daily.   30 tablet   0   . esomeprazole (NEXIUM) 40 MG capsule   Oral   Take 1 capsule (40 mg total) by mouth daily.   30 capsule   0   . gabapentin (NEURONTIN) 300 MG capsule   Oral   Take 1 capsule (300 mg total) by mouth 3 (three) times daily.   90 capsule   0   . lithium carbonate 300 MG capsule      Take one capsule in AM, and two at HS for mood stabilization.   90 capsule   0   . omeprazole (PRILOSEC) 40 MG capsule   Oral   Take 40 mg by mouth 2 (two) times daily.         Marland Kitchen oxyCODONE-acetaminophen (PERCOCET) 5-325 MG per tablet   Oral   Take 1-2 tablets by mouth every 6 (six) hours as needed for pain.   15 tablet   0   . predniSONE (DELTASONE) 20 MG tablet      2 tabs po daily x 4 days   8 tablet   0   . EXPIRED: traZODone (  DESYREL) 100 MG tablet   Oral   Take 2 tablets (200 mg total) by mouth at bedtime.   30 tablet   0     BP 145/97  Pulse 78  Temp(Src) 97.7 F (36.5 C)  Resp 16  SpO2 98%  Physical Exam  Nursing note and vitals reviewed. Constitutional: He is oriented to person, place, and time. He appears well-developed and well-nourished. No distress.  HENT:  Head: Normocephalic and atraumatic.  Mouth/Throat: Oropharynx is clear and moist.  Eyes: Conjunctivae are normal.  Neck: Normal range of motion. Neck supple.  Cardiovascular: Normal rate, regular rhythm, normal heart sounds and intact distal pulses.   Pulmonary/Chest: Effort normal and breath sounds normal.  Abdominal: Soft. Bowel sounds are normal. There is no tenderness.  Musculoskeletal: Normal range of motion. He exhibits no edema.       Back:  Neurological: He is alert and oriented to person, place, and time.  Sensation intact. Decreased strength in right lower chimney 4/5 compared to  left lower tremor a 5 out of 5. Ambulating in room with cane.  Skin: Skin is warm and dry. He is not diaphoretic.  Psychiatric: He has a normal mood and affect. His behavior is normal.    ED Course  Procedures (including critical care time)  Labs Reviewed - No data to display No results found.   1. Chronic back pain   2. Lumbar radiculopathy       MDM  52 year old male with acute on chronic low back pain. He is in the process of finding a new PCP as Dr. Bascom Levels is no longer practicing. I will give him pain control Percocet and prednisone for radicular symptoms. I will also refill his Prilosec and Neurontin. No red flags concerning patient's back pain. He is able to ambulate around the room with his cane without difficulty. No signs of cauda equina. Resource guide given for followup. Return precautions discussed. Patient states understanding of plan and is agreeable.       Trevor Mace, PA-C 09/30/12 1425

## 2012-09-30 NOTE — ED Notes (Signed)
Pt reports he had "several" back surgeries in [place], last 2002. Was most recently a pt of Dr. Janey Greaser but he is no longer practicing. Yesterday, slipped causing his back to start hurting. C/O lower back pain with intermittent radiation to right leg.

## 2012-09-30 NOTE — ED Notes (Signed)
Has hx of back surgery  Slipped going down steps and hurt his back

## 2012-09-30 NOTE — ED Provider Notes (Signed)
Medical screening examination/treatment/procedure(s) were performed by non-physician practitioner and as supervising physician I was immediately available for consultation/collaboration.  Airiana Elman L Kayleah Appleyard, MD 09/30/12 1711 

## 2012-10-02 ENCOUNTER — Encounter (HOSPITAL_COMMUNITY): Payer: Self-pay | Admitting: *Deleted

## 2012-10-02 ENCOUNTER — Emergency Department (HOSPITAL_COMMUNITY)
Admission: EM | Admit: 2012-10-02 | Discharge: 2012-10-02 | Disposition: A | Discharge status: Home / Self Care | Payer: Medicare Other | Attending: Emergency Medicine | Admitting: Emergency Medicine

## 2012-10-02 DIAGNOSIS — F329 Major depressive disorder, single episode, unspecified: Secondary | ICD-10-CM | POA: Insufficient documentation

## 2012-10-02 DIAGNOSIS — R5381 Other malaise: Secondary | ICD-10-CM | POA: Insufficient documentation

## 2012-10-02 DIAGNOSIS — G8929 Other chronic pain: Secondary | ICD-10-CM | POA: Insufficient documentation

## 2012-10-02 DIAGNOSIS — R209 Unspecified disturbances of skin sensation: Secondary | ICD-10-CM | POA: Insufficient documentation

## 2012-10-02 DIAGNOSIS — M545 Low back pain, unspecified: Secondary | ICD-10-CM | POA: Insufficient documentation

## 2012-10-02 DIAGNOSIS — R45851 Suicidal ideations: Secondary | ICD-10-CM | POA: Insufficient documentation

## 2012-10-02 DIAGNOSIS — F17200 Nicotine dependence, unspecified, uncomplicated: Secondary | ICD-10-CM | POA: Insufficient documentation

## 2012-10-02 DIAGNOSIS — Z79899 Other long term (current) drug therapy: Secondary | ICD-10-CM | POA: Insufficient documentation

## 2012-10-02 DIAGNOSIS — F1910 Other psychoactive substance abuse, uncomplicated: Secondary | ICD-10-CM | POA: Insufficient documentation

## 2012-10-02 DIAGNOSIS — Z8679 Personal history of other diseases of the circulatory system: Secondary | ICD-10-CM | POA: Insufficient documentation

## 2012-10-02 DIAGNOSIS — F3289 Other specified depressive episodes: Secondary | ICD-10-CM | POA: Insufficient documentation

## 2012-10-02 DIAGNOSIS — F319 Bipolar disorder, unspecified: Secondary | ICD-10-CM | POA: Insufficient documentation

## 2012-10-02 DIAGNOSIS — E119 Type 2 diabetes mellitus without complications: Secondary | ICD-10-CM | POA: Insufficient documentation

## 2012-10-02 DIAGNOSIS — Z8619 Personal history of other infectious and parasitic diseases: Secondary | ICD-10-CM | POA: Insufficient documentation

## 2012-10-02 DIAGNOSIS — I10 Essential (primary) hypertension: Secondary | ICD-10-CM | POA: Insufficient documentation

## 2012-10-02 LAB — CBC WITH DIFFERENTIAL/PLATELET
Hemoglobin: 15.9 g/dL (ref 13.0–17.0)
Lymphocytes Relative: 11 % — ABNORMAL LOW (ref 12–46)
Lymphs Abs: 0.9 10*3/uL (ref 0.7–4.0)
Monocytes Relative: 3 % (ref 3–12)
Neutro Abs: 7.1 10*3/uL (ref 1.7–7.7)
Neutrophils Relative %: 87 % — ABNORMAL HIGH (ref 43–77)
RBC: 4.69 MIL/uL (ref 4.22–5.81)
WBC: 8.2 10*3/uL (ref 4.0–10.5)

## 2012-10-02 LAB — BASIC METABOLIC PANEL
BUN: 19 mg/dL (ref 6–23)
Chloride: 101 mEq/L (ref 96–112)
Glucose, Bld: 185 mg/dL — ABNORMAL HIGH (ref 70–99)
Potassium: 4.4 mEq/L (ref 3.5–5.1)
Sodium: 136 mEq/L (ref 135–145)

## 2012-10-02 LAB — RAPID URINE DRUG SCREEN, HOSP PERFORMED
Barbiturates: NOT DETECTED
Opiates: NOT DETECTED
Tetrahydrocannabinol: NOT DETECTED

## 2012-10-02 LAB — ACETAMINOPHEN LEVEL: Acetaminophen (Tylenol), Serum: <15 ug/mL (ref 10–30)

## 2012-10-02 LAB — SALICYLATE LEVEL: Salicylate Lvl: <2 mg/dL — ABNORMAL LOW (ref 2.8–20.0)

## 2012-10-02 MED ORDER — HYDROCODONE-ACETAMINOPHEN 5-325 MG PO TABS: 1.0000 | ORAL_TABLET | ORAL | Status: DC | PRN

## 2012-10-02 MED ORDER — IBUPROFEN 600 MG PO TABS: 600.0000 mg | ORAL_TABLET | Freq: Four times a day (QID) | ORAL | Status: DC | PRN

## 2012-10-02 MED ORDER — NICOTINE 21 MG/24HR TD PT24
21.0000 mg | MEDICATED_PATCH | Freq: Once | TRANSDERMAL | Status: DC
  Administered 2012-10-02: 21 mg via TRANSDERMAL
  Filled 2012-10-02: qty 1

## 2012-10-02 MED ORDER — METHOCARBAMOL 500 MG PO TABS: 500.0000 mg | ORAL_TABLET | Freq: Two times a day (BID) | ORAL | Status: DC

## 2012-10-02 MED ORDER — KETOROLAC TROMETHAMINE 60 MG/2ML IM SOLN
60.0000 mg | Freq: Once | INTRAMUSCULAR | Status: AC
  Administered 2012-10-02: 60 mg via INTRAMUSCULAR
  Filled 2012-10-02: qty 2

## 2012-10-02 MED ORDER — HYDROCODONE-ACETAMINOPHEN 5-325 MG PO TABS
1.0000 | ORAL_TABLET | Freq: Once | ORAL | Status: AC
  Administered 2012-10-02: 1 via ORAL
  Filled 2012-10-02: qty 1

## 2012-10-02 MED ORDER — METHOCARBAMOL 500 MG PO TABS
500.0000 mg | ORAL_TABLET | Freq: Once | ORAL | Status: AC
  Administered 2012-10-02: 500 mg via ORAL
  Filled 2012-10-02: qty 1

## 2012-10-02 NOTE — ED Provider Notes (Signed)
History     CSN: [id number]  Arrival date & time 10/02/12  1223   First MD Initiated Contact with Patient 10/02/12 1228      Chief Complaint  Patient presents with  . Back Pain    (Consider location/radiation/quality/duration/timing/severity/associated sxs/prior treatment) HPI Pt with 20+ years of chronic low back pain. Seen 2 days ago for same. Thinks he may have turned wrong and is now having worsening low back pain unresponsive to percocet prescribed 2 days ago. Pain radiates down R leg. No incontinence, fever/chills, saddle anesthesia.  Past Medical History  Diagnosis Date  . Diabetes mellitus   . Bipolar 1 disorder   . Hepatitis C carrier   . WPW (Wolff-Parkinson-White syndrome)   . Hypertension   . Depression   . Chronic pain     Past Surgical History  Procedure Laterality Date  . Back surgery    . Rhinoplasty    . Chest surgery    . Tonsillectomy      No family history on file.  History  Substance Use Topics  . Smoking status: Current Some Day Smoker  . Smokeless tobacco: Not on file  . Alcohol Use: Yes      Review of Systems  Constitutional: Negative for fever and chills.  Gastrointestinal: Negative for abdominal pain.  Genitourinary: Negative for difficulty urinating.  Musculoskeletal: Positive for back pain.  Neurological: Positive for weakness and numbness. Negative for headaches.  All other systems reviewed and are negative.    Allergies  Review of patient's allergies indicates no known allergies.  Home Medications   Current Outpatient Rx  Name  Route  Sig  Dispense  Refill  . esomeprazole (NEXIUM) 40 MG capsule   Oral   Take 1 capsule (40 mg total) by mouth daily.   30 capsule   0   . gabapentin (NEURONTIN) 300 MG capsule   Oral   Take 1 capsule (300 mg total) by mouth 3 (three) times daily.   90 capsule   0   . lithium carbonate 300 MG capsule   Oral   Take 300-600 mg by mouth 2 (two) times daily. Take 300mg  in the morning and  600mg  at bedtime         . oxyCODONE-acetaminophen (PERCOCET) 5-325 MG per tablet   Oral   Take 1-2 tablets by mouth every 6 (six) hours as needed for pain.   15 tablet   0   . predniSONE (DELTASONE) 20 MG tablet   Oral   Take 40 mg by mouth daily. Take 40mg  daily for 4 days         . tiZANidine (ZANAFLEX) 4 MG tablet   Oral   Take 8 mg by mouth 3 (three) times daily.         . traZODone (DESYREL) 150 MG tablet   Oral   Take 300 mg by mouth at bedtime.         Marland Kitchen ibuprofen (ADVIL,MOTRIN) 600 MG tablet   Oral   Take 1 tablet (600 mg total) by mouth every 6 (six) hours as needed for pain.   30 tablet   0   . methocarbamol (ROBAXIN) 500 MG tablet   Oral   Take 1 tablet (500 mg total) by mouth 2 (two) times daily.   20 tablet   0     BP 155/108  Pulse 80  Temp(Src) 98.7 F (37.1 C) (Oral)  Resp 18  SpO2 97%  Physical Exam  Nursing note and vitals  reviewed. Constitutional: He is oriented to person, place, and time. He appears well-developed and well-nourished. No distress.  HENT:  Head: Normocephalic and atraumatic.  Mouth/Throat: Oropharynx is clear and moist.  Eyes: EOM are normal. Pupils are equal, round, and reactive to light.  Neck: Normal range of motion. Neck supple.  Cardiovascular: Normal rate and regular rhythm.   Pulmonary/Chest: Effort normal and breath sounds normal. No respiratory distress. He has no wheezes. He has no rales.  Abdominal: Soft. Bowel sounds are normal. There is no tenderness.  Musculoskeletal: Normal range of motion. He exhibits tenderness (TTP over midline lumbar spine. ). He exhibits no edema.  Neurological: He is alert and oriented to person, place, and time.  No saddle anesthesia. +pain with elevation of RLE but no drift. 5/5 motor in LLE. Sensation fully intact with no saddle anesthesia  Skin: Skin is warm and dry. No rash noted. No erythema.    ED Course  Procedures (including critical care time)  Labs Reviewed - No  data to display No results found.   1. Chronic low back pain       MDM  Pt advised to f/u with chronic pain management and NSG. No concerning red flag signs. Exam essentially unchanged from previous visit.         Loren Racer, MD 10/02/12 1257

## 2012-10-02 NOTE — ED Notes (Signed)
House coverage notified for safety sitter, security at bedside, pt updated on SI precautions, pt verbalizes understanding

## 2012-10-02 NOTE — ED Notes (Signed)
Telepsych conference in progress. 

## 2012-10-02 NOTE — ED Provider Notes (Signed)
Patient was seen and evaluated by psychiatry.  The patient is not suicidal.  He has an unfortunate situation where his physician who is prescribing chronic narcotics is lost his ability to prescribe narcotics.  I will write the patient a single prescription for 15 Vicodin and instructed to followup with a pain specialist.  He has been informed that he will Not receive any narcotic medications from this emergency department any longer.  This was a single prescription  Lyanne Co, MD 10/02/12 2120

## 2012-10-02 NOTE — ED Notes (Signed)
Sitter at Bedside.

## 2012-10-02 NOTE — ED Provider Notes (Signed)
When nurse went to D/c pt, pt stated that if he went home he would kill himself by hanging himself or stabbing himself in the abdomen. When confronted about these statements, pt denied any SI or having made prev statement. Pt is voluntary and will get psych eval. Pt likely is malingering and definitely displays drug seeking behavior.   Loren Racer, MD 10/02/12 [phone]

## 2012-10-02 NOTE — ED Notes (Signed)
Molli Hazard, NT at bedside for [facility] sitter

## 2012-10-02 NOTE — ED Notes (Signed)
Pt smoking in room, pt informed that this is not acceptable, security at bedside, cigarettes & lighter removed from the pts room

## 2012-10-02 NOTE — ED Notes (Signed)
Patient requested and received a Sprite soda.

## 2012-10-02 NOTE — ED Notes (Signed)
Pt c/o bil lower back pain that radiates to L leg, pt denies injury to the area, pt reports hx of chronic back pain, pt ambulatory, pt states, "I am bipolar & pain is making it worse. If we don't get my pain under control I am going to be suicidal." pt denies current SI & HI, pt denies auditory & visual hallucinations, pt moves all extremities, pt reports lose of hearing & vision with pain episodes
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
## 2012-10-02 NOTE — ED Notes (Signed)
Ranae Palms, MD notified re: SI precautions -

## 2012-10-02 NOTE — ED Notes (Signed)
Pt states, "I have tried to kill myself before 5 times, & if I can't get my pain controlled I will do it again. I don't have no phone & no way to call 911 if I did go home."

## 2012-10-04 ENCOUNTER — Emergency Department (HOSPITAL_COMMUNITY)
Admission: EM | Admit: 2012-10-04 | Discharge: 2012-10-04 | Disposition: A | Discharge status: Home / Self Care | Payer: Medicare Other | Attending: Emergency Medicine | Admitting: Emergency Medicine

## 2012-10-04 ENCOUNTER — Encounter (HOSPITAL_COMMUNITY): Payer: Self-pay | Admitting: *Deleted

## 2012-10-04 DIAGNOSIS — IMO0002 Reserved for concepts with insufficient information to code with codable children: Secondary | ICD-10-CM | POA: Insufficient documentation

## 2012-10-04 DIAGNOSIS — Z8619 Personal history of other infectious and parasitic diseases: Secondary | ICD-10-CM | POA: Insufficient documentation

## 2012-10-04 DIAGNOSIS — M549 Dorsalgia, unspecified: Secondary | ICD-10-CM

## 2012-10-04 DIAGNOSIS — G8929 Other chronic pain: Secondary | ICD-10-CM | POA: Insufficient documentation

## 2012-10-04 DIAGNOSIS — Z8679 Personal history of other diseases of the circulatory system: Secondary | ICD-10-CM | POA: Insufficient documentation

## 2012-10-04 DIAGNOSIS — F319 Bipolar disorder, unspecified: Secondary | ICD-10-CM | POA: Insufficient documentation

## 2012-10-04 DIAGNOSIS — M545 Low back pain, unspecified: Secondary | ICD-10-CM | POA: Insufficient documentation

## 2012-10-04 DIAGNOSIS — E119 Type 2 diabetes mellitus without complications: Secondary | ICD-10-CM | POA: Insufficient documentation

## 2012-10-04 DIAGNOSIS — F17200 Nicotine dependence, unspecified, uncomplicated: Secondary | ICD-10-CM | POA: Insufficient documentation

## 2012-10-04 DIAGNOSIS — Z79899 Other long term (current) drug therapy: Secondary | ICD-10-CM | POA: Insufficient documentation

## 2012-10-04 DIAGNOSIS — I10 Essential (primary) hypertension: Secondary | ICD-10-CM | POA: Insufficient documentation

## 2012-10-04 MED ORDER — HYDROCODONE-ACETAMINOPHEN 5-325 MG PO TABS: 2.0000 | ORAL_TABLET | Freq: Four times a day (QID) | ORAL | Status: DC | PRN

## 2012-10-04 MED ORDER — HYDROCODONE-ACETAMINOPHEN 5-325 MG PO TABS
2.0000 | ORAL_TABLET | Freq: Once | ORAL | Status: AC
  Administered 2012-10-04: 2 via ORAL
  Filled 2012-10-04: qty 2

## 2012-10-04 NOTE — ED Provider Notes (Signed)
History     CSN: [id number]  Arrival date & time 10/04/12  1239   First MD Initiated Contact with Patient 10/04/12 1301      Chief Complaint  Patient presents with  . Back Pain    (Consider location/radiation/quality/duration/timing/severity/associated sxs/prior treatment) HPI Comments: Patient is a 52 year old male with history of chronic low back pain. He presents today for an exacerbation of his back pain. He states he was lifting something up 5 days ago when he exacerbated his low back pain. He has been seen in this emergency department for this complaint 2 days ago he was seen and given 15 hydrocodone. He states that those did nothing for him and he could not follow up with pain management over the weekend because he was in too much pain. He states that before this time he was not using any sort of narcotic pain medication for his back pain. He denies IV drug abuse, history of cancer, bowel or bladder incontinence. Pain is sharp with radiation into his right leg. No fevers, chills, nausea, vomiting, abdominal pain, numbness, weakness. He states he will kill himself if he does not get pain medicine because he can't live in this much pain. His plan is to stab himself.  The history is provided by the patient. No language interpreter was used.    Past Medical History  Diagnosis Date  . Diabetes mellitus   . Bipolar 1 disorder   . Hepatitis C carrier   . WPW (Wolff-Parkinson-White syndrome)   . Hypertension   . Depression   . Chronic pain     Past Surgical History  Procedure Laterality Date  . Back surgery    . Rhinoplasty    . Chest surgery    . Tonsillectomy      No family history on file.  History  Substance Use Topics  . Smoking status: Current Some Day Smoker  . Smokeless tobacco: Not on file  . Alcohol Use: Yes      Review of Systems  Constitutional: Negative for fever and chills.  Respiratory: Negative for shortness of breath.   Cardiovascular: Negative for  chest pain.  Gastrointestinal: Negative for nausea, vomiting and abdominal pain.  Genitourinary: Negative for difficulty urinating.  Musculoskeletal: Positive for back pain.  All other systems reviewed and are negative.    Allergies  Review of patient's allergies indicates no known allergies.  Home Medications   Current Outpatient Rx  Name  Route  Sig  Dispense  Refill  . citalopram (CELEXA) 20 MG tablet   Oral   Take 20 mg by mouth daily.         Marland Kitchen esomeprazole (NEXIUM) 40 MG capsule   Oral   Take 1 capsule (40 mg total) by mouth daily.   30 capsule   0   . gabapentin (NEURONTIN) 300 MG capsule   Oral   Take 1 capsule (300 mg total) by mouth 3 (three) times daily.   90 capsule   0   . ibuprofen (ADVIL,MOTRIN) 800 MG tablet   Oral   Take 800 mg by mouth every 8 (eight) hours as needed for pain.         Marland Kitchen lithium carbonate 300 MG capsule   Oral   Take 300-600 mg by mouth 2 (two) times daily. Take 300mg  in the morning and 600mg  at bedtime         . predniSONE (DELTASONE) 20 MG tablet   Oral   Take 40 mg by mouth daily.  Take 40mg  daily for 4 days         . tiZANidine (ZANAFLEX) 4 MG tablet   Oral   Take 8 mg by mouth 3 (three) times daily.         . traZODone (DESYREL) 150 MG tablet   Oral   Take 300 mg by mouth at bedtime.         Marland Kitchen HYDROcodone-acetaminophen (NORCO/VICODIN) 5-325 MG per tablet   Oral   Take 2 tablets by mouth every 6 (six) hours as needed for pain.   20 tablet   0     BP 135/86  Pulse 84  Temp(Src) 98.4 F (36.9 C) (Oral)  Resp 18  SpO2 99%  Physical Exam  Nursing note and vitals reviewed. Constitutional: He is oriented to person, place, and time. He appears well-developed and well-nourished. No distress.  HENT:  Head: Normocephalic and atraumatic.  Right Ear: External ear normal.  Left Ear: External ear normal.  Nose: Nose normal.  Eyes: Conjunctivae are normal.  Neck: Normal range of motion. No tracheal deviation  present.  Cardiovascular: Normal rate, regular rhythm and normal heart sounds.   Pulmonary/Chest: Effort normal and breath sounds normal. No stridor.  Abdominal: Soft. He exhibits no distension. There is no tenderness.  Musculoskeletal: Normal range of motion.       Lumbar back: He exhibits tenderness and bony tenderness.  No deformities, step-off There is bony tenderness in his lumbar spine, but this appears to be chronic Positive straight leg raise on right  Neurological: He is alert and oriented to person, place, and time.  Skin: Skin is warm and dry. He is not diaphoretic.  Psychiatric: He has a normal mood and affect. His behavior is normal. He does not exhibit a depressed mood.    ED Course  Procedures (including critical care time)  Labs Reviewed - No data to display No results found.   1. Back pain       MDM  Patient presents today with an exacerbation of his chronic back pain. He is evaluated for this back pain previously at this ED 2 days ago. Today he was given a Rx for 20 hydrocodone's. It was explained to him at length that he must followup with pain management. The patient seemed to be agreeable to this plan. No red flags including bowel or bladder incontinence, IV drug abuse, history of cancer. Dr. Denton Lank evaluated this patient and agrees with plan. Vital signs stable for discharge. Patient / Family / Caregiver informed of clinical course, understand medical decision-making process, and agree with plan.         Mora Bellman, PA-C 10/04/12 [phone]

## 2012-10-04 NOTE — ED Notes (Signed)
Pt states has chronic lower back pain, this past Thursday almost fell down stairs and caught himself, which twisted his back, states having lower back pain "flare up".

## 2012-10-05 ENCOUNTER — Ambulatory Visit (HOSPITAL_COMMUNITY)
Admission: RE | Admit: 2012-10-05 | Discharge: 2012-10-05 | Disposition: A | Discharge status: Home / Self Care | Payer: Medicare Other | Attending: Psychiatry | Admitting: Psychiatry

## 2012-10-05 DIAGNOSIS — F3130 Bipolar disorder, current episode depressed, mild or moderate severity, unspecified: Secondary | ICD-10-CM | POA: Insufficient documentation

## 2012-10-05 NOTE — H&P (Signed)
Behavioral Health Medical Screening Exam  Billy [name] is an 52 y.o. male.  Review of Systems  Constitutional: Negative.   HENT: Negative.   Eyes: Negative.   Respiratory: Negative.   Cardiovascular: Negative.   Gastrointestinal: Negative.   Genitourinary: Negative.   Musculoskeletal: Positive for back pain.  Skin: Negative.   Neurological: Negative.   Endo/Heme/Allergies: Negative.   Psychiatric/Behavioral: Positive for depression. Negative for suicidal ideas, hallucinations, memory loss and substance abuse. The patient is nervous/anxious. The patient does not have insomnia.     Physical Exam  Constitutional: He is oriented to person, place, and time. He appears well-developed and well-nourished.  HENT:  Head: Normocephalic and atraumatic.  Right Ear: External ear normal.  Left Ear: External ear normal.  Nose: Nose normal.  Mouth/Throat: Oropharynx is clear and moist.  Eyes: Conjunctivae are normal. Pupils are equal, round, and reactive to light.  Neck: Normal range of motion. Neck supple.  Cardiovascular: Normal rate, regular rhythm, normal heart sounds and intact distal pulses.   Respiratory: Effort normal and breath sounds normal.  GI: Soft. Bowel sounds are normal.  Musculoskeletal: Normal range of motion.  Neurological: He is alert and oriented to person, place, and time.  Skin: Skin is warm and dry.    There were no vitals taken for this visit. BP-160/90 P-87, R-18 pulse ox 97 room air Recommendations:  Patient expressing frustration over his current level of pain. He reports having had five back surgeries and last week caught himself from falling. He has been having pain since this time and has been unable to obtain relief. The patient states "I'm tired of people thinking I'm suicidal when I'm just in pain." Patient denies any SI. The patient was referred to [facility] for an appointment. The patient denies any current psychiatric  symptoms with only complaint of back pain. Patient is tearful when speaking about his pain. He reports that he takes blood pressure medicine everyday and that the pain makes it run higher.    Based on my evaluation the patient does not appear to have an emergency medical condition.   Quinteria Chisum NP-C 10/05/2012, 3:34 PM
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
## 2012-10-05 NOTE — ED Provider Notes (Signed)
Medical screening examination/treatment/procedure(s) were performed by non-physician practitioner and as supervising physician I was immediately available for consultation/collaboration.   Billy Dunbar E Zamyah Wiesman, MD 10/05/12 0802 

## 2012-10-09 ENCOUNTER — Encounter (HOSPITAL_COMMUNITY): Payer: Self-pay | Admitting: *Deleted

## 2012-10-09 ENCOUNTER — Emergency Department (HOSPITAL_COMMUNITY)
Admission: EM | Admit: 2012-10-09 | Discharge: 2012-10-12 | Disposition: A | Discharge status: Other Acute Inpatient Hospital | Payer: Medicaid Other | Source: Home / Self Care | Attending: Emergency Medicine | Admitting: Emergency Medicine

## 2012-10-09 ENCOUNTER — Emergency Department (HOSPITAL_COMMUNITY): Payer: Medicaid Other

## 2012-10-09 DIAGNOSIS — S31109A Unspecified open wound of abdominal wall, unspecified quadrant without penetration into peritoneal cavity, initial encounter: Secondary | ICD-10-CM

## 2012-10-09 DIAGNOSIS — F319 Bipolar disorder, unspecified: Secondary | ICD-10-CM

## 2012-10-09 DIAGNOSIS — F1010 Alcohol abuse, uncomplicated: Secondary | ICD-10-CM

## 2012-10-09 DIAGNOSIS — F1910 Other psychoactive substance abuse, uncomplicated: Secondary | ICD-10-CM

## 2012-10-09 DIAGNOSIS — R45851 Suicidal ideations: Secondary | ICD-10-CM

## 2012-10-09 DIAGNOSIS — F1410 Cocaine abuse, uncomplicated: Secondary | ICD-10-CM

## 2012-10-09 DIAGNOSIS — S31119A Laceration without foreign body of abdominal wall, unspecified quadrant without penetration into peritoneal cavity, initial encounter: Secondary | ICD-10-CM

## 2012-10-09 DIAGNOSIS — T1491XA Suicide attempt, initial encounter: Secondary | ICD-10-CM

## 2012-10-09 HISTORY — DX: Suicide attempt, initial encounter: T14.91XA

## 2012-10-09 HISTORY — DX: Type 2 diabetes mellitus without complications: E11.9

## 2012-10-09 LAB — RAPID URINE DRUG SCREEN, HOSP PERFORMED
Barbiturates: NOT DETECTED
Opiates: NOT DETECTED
Tetrahydrocannabinol: NOT DETECTED

## 2012-10-09 LAB — CBC
Hemoglobin: 15.9 g/dL (ref 13.0–17.0)
MCHC: 35.6 g/dL (ref 30.0–36.0)
RDW: 12.7 % (ref 11.5–15.5)

## 2012-10-09 LAB — TYPE AND SCREEN
ABO/RH(D): A POS
Antibody Screen: NEGATIVE
Unit division: 0

## 2012-10-09 LAB — PROTIME-INR
INR: 0.92 (ref 0.00–1.49)
Prothrombin Time: 12.3 seconds (ref 11.6–15.2)

## 2012-10-09 LAB — POCT I-STAT, CHEM 8
HCT: 46 % (ref 39.0–52.0)
Hemoglobin: 15.6 g/dL (ref 13.0–17.0)
Potassium: 3.7 mEq/L (ref 3.5–5.1)
Sodium: 139 mEq/L (ref 135–145)

## 2012-10-09 LAB — COMPREHENSIVE METABOLIC PANEL
ALT: 66 U/L — ABNORMAL HIGH (ref 0–53)
CO2: 25 mEq/L (ref 19–32)
Calcium: 9.3 mg/dL (ref 8.4–10.5)
GFR calc Af Amer: 65 mL/min — ABNORMAL LOW (ref >90)
GFR calc non Af Amer: 56 mL/min — ABNORMAL LOW (ref >90)
Glucose, Bld: 171 mg/dL — ABNORMAL HIGH (ref 70–99)
Sodium: 138 mEq/L (ref 135–145)

## 2012-10-09 LAB — GLUCOSE, CAPILLARY: Glucose-Capillary: 166 mg/dL — ABNORMAL HIGH (ref 70–99)

## 2012-10-09 LAB — ABO/RH: ABO/RH(D): A POS

## 2012-10-09 LAB — CG4 I-STAT (LACTIC ACID): Lactic Acid, Venous: 2.58 mmol/L — ABNORMAL HIGH (ref 0.5–2.2)

## 2012-10-09 LAB — ACETAMINOPHEN LEVEL: Acetaminophen (Tylenol), Serum: <15 ug/mL (ref 10–30)

## 2012-10-09 MED ORDER — IOHEXOL 300 MG/ML  SOLN
100.0000 mL | Freq: Once | INTRAMUSCULAR | Status: AC | PRN
  Administered 2012-10-09: 100 mL via INTRAVENOUS

## 2012-10-09 MED ORDER — NICOTINE 21 MG/24HR TD PT24
21.0000 mg | MEDICATED_PATCH | Freq: Every day | TRANSDERMAL | Status: DC
  Administered 2012-10-09 – 2012-10-11 (×3): 21 mg via TRANSDERMAL
  Filled 2012-10-09 (×3): qty 1

## 2012-10-09 MED ORDER — TETANUS-DIPHTH-ACELL PERTUSSIS 5-2.5-18.5 LF-MCG/0.5 IM SUSP
0.5000 mL | Freq: Once | INTRAMUSCULAR | Status: AC
  Administered 2012-10-09: 0.5 mL via INTRAMUSCULAR
  Filled 2012-10-09: qty 0.5

## 2012-10-09 MED ORDER — VALACYCLOVIR HCL 500 MG PO TABS
1000.0000 mg | ORAL_TABLET | Freq: Every day | ORAL | Status: DC
  Administered 2012-10-10 – 2012-10-11 (×2): 1000 mg via ORAL
  Filled 2012-10-09 (×2): qty 2

## 2012-10-09 MED ORDER — SODIUM CHLORIDE 0.9 % IV BOLUS (SEPSIS)
1000.0000 mL | Freq: Once | INTRAVENOUS | Status: AC
  Administered 2012-10-09: 1000 mL via INTRAVENOUS

## 2012-10-09 MED ORDER — HYDROMORPHONE HCL PF 1 MG/ML IJ SOLN
1.0000 mg | Freq: Once | INTRAMUSCULAR | Status: AC
  Administered 2012-10-09: 1 mg via INTRAVENOUS
  Filled 2012-10-09: qty 1

## 2012-10-09 MED ORDER — LITHIUM CARBONATE 300 MG PO TABS
300.0000 mg | ORAL_TABLET | Freq: Two times a day (BID) | ORAL | Status: DC
  Filled 2012-10-09: qty 1

## 2012-10-09 MED ORDER — HYDROMORPHONE HCL PF 1 MG/ML IJ SOLN
0.5000 mg | Freq: Once | INTRAMUSCULAR | Status: AC
  Administered 2012-10-09: 0.5 mg via INTRAVENOUS
  Filled 2012-10-09: qty 1

## 2012-10-09 MED ORDER — VITAMIN B-1 100 MG PO TABS
250.0000 mg | ORAL_TABLET | Freq: Once | ORAL | Status: AC
  Administered 2012-10-09: 250 mg via ORAL
  Filled 2012-10-09: qty 3

## 2012-10-09 MED ORDER — GABAPENTIN 300 MG PO CAPS
300.0000 mg | ORAL_CAPSULE | Freq: Three times a day (TID) | ORAL | Status: DC
  Administered 2012-10-09 – 2012-10-11 (×7): 300 mg via ORAL
  Filled 2012-10-09 (×7): qty 1

## 2012-10-09 MED ORDER — LOSARTAN POTASSIUM-HCTZ 50-12.5 MG PO TABS: 1.0000 | ORAL_TABLET | Freq: Every day | ORAL | Status: DC

## 2012-10-09 MED ORDER — CITALOPRAM HYDROBROMIDE 10 MG PO TABS
20.0000 mg | ORAL_TABLET | Freq: Every day | ORAL | Status: DC
  Administered 2012-10-10 – 2012-10-11 (×2): 20 mg via ORAL
  Filled 2012-10-09 (×2): qty 2

## 2012-10-09 MED ORDER — INSULIN GLARGINE 100 UNIT/ML ~~LOC~~ SOLN
30.0000 [IU] | Freq: Every day | SUBCUTANEOUS | Status: DC
  Administered 2012-10-10 – 2012-10-11 (×3): 30 [IU] via SUBCUTANEOUS
  Filled 2012-10-09 (×4): qty 0.3

## 2012-10-09 MED ORDER — TRAZODONE HCL 50 MG PO TABS
300.0000 mg | ORAL_TABLET | Freq: Every day | ORAL | Status: DC
  Administered 2012-10-09 – 2012-10-11 (×3): 300 mg via ORAL
  Filled 2012-10-09: qty 6
  Filled 2012-10-09: qty 5
  Filled 2012-10-09: qty 6
  Filled 2012-10-09: qty 1

## 2012-10-09 MED ORDER — HYDROMORPHONE HCL PF 1 MG/ML IJ SOLN: 0.5000 mg | INTRAMUSCULAR | Status: DC | PRN

## 2012-10-09 MED ORDER — PANTOPRAZOLE SODIUM 40 MG PO TBEC
40.0000 mg | DELAYED_RELEASE_TABLET | Freq: Every day | ORAL | Status: DC
  Administered 2012-10-10 – 2012-10-11 (×2): 40 mg via ORAL
  Filled 2012-10-09 (×2): qty 1

## 2012-10-09 MED ORDER — LITHIUM CARBONATE 300 MG PO CAPS
300.0000 mg | ORAL_CAPSULE | Freq: Two times a day (BID) | ORAL | Status: DC
  Administered 2012-10-10 – 2012-10-11 (×5): 300 mg via ORAL
  Filled 2012-10-09 (×5): qty 1
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
## 2012-10-09 NOTE — ED Notes (Signed)
Returned from ct scan 
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
## 2012-10-09 NOTE — ED Notes (Signed)
Patient transported to CT with RN present. 

## 2012-10-09 NOTE — ED Notes (Signed)
Notified AC of need for sitter for SI 

## 2012-10-09 NOTE — ED Notes (Signed)
md at bedside to suture laceration

## 2012-10-09 NOTE — ED Notes (Signed)
Wanded by security.  Blue scrubs on, amb in the hall without difficulty.

## 2012-10-10 LAB — LITHIUM LEVEL: Lithium Lvl: 0.76 mEq/L — ABNORMAL LOW (ref 0.80–1.40)

## 2012-10-10 MED ORDER — LOSARTAN POTASSIUM 50 MG PO TABS
50.0000 mg | ORAL_TABLET | Freq: Every day | ORAL | Status: DC
  Administered 2012-10-10 – 2012-10-11 (×2): 50 mg via ORAL
  Filled 2012-10-10 (×2): qty 1

## 2012-10-10 MED ORDER — DOCUSATE SODIUM 100 MG PO CAPS
100.0000 mg | ORAL_CAPSULE | Freq: Every day | ORAL | Status: DC | PRN
  Administered 2012-10-10: 100 mg via ORAL
  Filled 2012-10-10: qty 1

## 2012-10-10 MED ORDER — HYDROCHLOROTHIAZIDE 12.5 MG PO CAPS
12.5000 mg | ORAL_CAPSULE | Freq: Every day | ORAL | Status: DC
  Administered 2012-10-10 – 2012-10-11 (×2): 12.5 mg via ORAL
  Filled 2012-10-10 (×2): qty 1

## 2012-10-10 MED ORDER — IBUPROFEN 400 MG PO TABS
600.0000 mg | ORAL_TABLET | Freq: Once | ORAL | Status: AC
  Administered 2012-10-10: 600 mg via ORAL
  Filled 2012-10-10: qty 1

## 2012-10-10 MED ORDER — MORPHINE SULFATE 4 MG/ML IJ SOLN
6.0000 mg | Freq: Once | INTRAMUSCULAR | Status: AC
  Administered 2012-10-10: 6 mg via INTRAMUSCULAR
  Filled 2012-10-10: qty 2

## 2012-10-10 MED ORDER — HYDROCODONE-ACETAMINOPHEN 5-325 MG PO TABS
1.0000 | ORAL_TABLET | Freq: Four times a day (QID) | ORAL | Status: DC | PRN
  Administered 2012-10-10 – 2012-10-11 (×6): 2 via ORAL
  Filled 2012-10-10 (×6): qty 2

## 2012-10-10 NOTE — ED Notes (Signed)
Up voided without any problem.

## 2012-10-10 NOTE — ED Provider Notes (Signed)
Pt resting comfortably, nad. Vitals normal. Discussed w act team - possible placement to [facility] when beds become available. Awaiting placement.      Suzi Roots, MD 10/10/12 [phone]
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
## 2012-10-11 ENCOUNTER — Encounter (HOSPITAL_COMMUNITY): Payer: Self-pay | Admitting: *Deleted

## 2012-10-11 LAB — GLUCOSE, CAPILLARY: Glucose-Capillary: 159 mg/dL — ABNORMAL HIGH (ref 70–99)

## 2012-10-11 NOTE — BH Assessment (Signed)
Assessment Note   Billy [name] is an 52 y.o. male that is being reassessed today as he awaits disposition regarding placement.  Pt continues to endorse SI with multiple plans and an inability to contract for safety d/t his ongoing SA and mood instabilty.  Pt reports multiple previous gestures and non-compliance with  Treatment in the past. 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  Pt is currently on Parole, but has not current legal charges.  Placement pending.    Axis I: Bipolar, Depressed and Alcohol Abuse Axis II: Cluster B Traits Axis III:  Past Medical History  Diagnosis Date  . Bipolar 1 disorder   . Suicide attempt   . Diabetes mellitus without complication   . Hypertension    Axis IV: other psychosocial or environmental problems, problems related to social environment and chronicity Axis V: 31-40 impairment in reality testing  Past Medical History:  Past Medical History  Diagnosis Date  . Bipolar 1 disorder   . Suicide attempt   . Diabetes mellitus without complication   . Hypertension     No past surgical history on file.  Family History: No family history on file.  Social History:  has no tobacco, alcohol, and drug history on file.  Additional Social History:  Alcohol / Drug Use Pain Medications: See PTA medication list Prescriptions: See PTA medication list Over the Counter: See PTA medication list History of alcohol / drug use?: Yes Longest period of sobriety (when/how long): 10 years Negative Consequences of Use: Legal Withdrawal Symptoms:  (Denies any current withdrawal symptoms) Substance #1 Name of Substance 1: ETOH 1 - Age of First Use: 52 years old 1 - Amount (size/oz): Will drink a pint per day 1 - Frequency:  Daily consumption 1 - Duration: Last 10 days at that rate 1 - Last Use / Amount: 05/10 consumed 1/2 pint  CIWA: CIWA-Ar BP: 107/70 mmHg Pulse Rate: 56 COWS:    Allergies: No Known Allergies  Home Medications:  (Not in a hospital admission)  OB/GYN Status:  No LMP for male patient.  General Assessment Data Location of Assessment: [facility] ED ACT Assessment: Yes Living Arrangements: Alone Can pt return to current living arrangement?: Yes Admission Status: Involuntary Is patient capable of signing voluntary admission?: No Transfer from: Acute Hospital Referral Source: Self/Family/Friend     Risk to self Suicidal Ideation: Yes-Currently Present Suicidal Intent: No Is patient at risk for suicide?: Yes Suicidal Plan?: Yes-Currently Present Specify Current Suicidal Plan: to stab or hand self or various other means Access to Means: Yes Specify Access to Suicidal Means: outside of hospital, multiple means What has been your use of drugs/alcohol within the last 12 months?: relapsed on ETOH Previous Attempts/Gestures: Yes How many times?: 6 Other Self Harm Risks: chronicity and SA Triggers for Past Attempts:  (chronic pain) Intentional Self Injurious Behavior: None Family Suicide History: No Recent stressful life event(s): Recent negative physical changes;Turmoil (Comment) (chronic pain and relapse) Persecutory voices/beliefs?: No Depression: Yes Depression Symptoms: Insomnia;Guilt;Loss of interest in usual pleasures;Feeling worthless/self pity;Feeling angry/irritable Substance abuse history and/or treatment for substance abuse?: Yes Suicide prevention information given to non-admitted patients: Not applicable  Risk to Others Homicidal Ideation: No Thoughts of Harm to Others: No Current Homicidal Intent: No Current Homicidal Plan:  No Access to Homicidal Means: No Identified Victim: none per pt History of harm to others?: No Assessment of Violence: None Noted Violent Behavior  Description: speaking with sitter; alert and oriented Does patient have access to weapons?:  (access to knives and sharps outside of ER) Criminal Charges Pending?: No Does patient have a court date: No  Psychosis Hallucinations: None noted Delusions: None noted  Mental Status Report Appear/Hygiene: Disheveled Eye Contact: Good Motor Activity: Restlessness Speech: Logical/coherent Level of Consciousness: Alert Mood: Apathetic;Helpless Affect: Appropriate to circumstance;Apathetic Anxiety Level: Minimal Thought Processes: Relevant Judgement: Impaired Orientation: Person;Place;Time;Situation Obsessive Compulsive Thoughts/Behaviors: Moderate  Cognitive Functioning Concentration: Decreased Memory: Recent Intact;Recent Impaired IQ: Average Insight: Poor Impulse Control: Poor Appetite: Good Weight Loss: 0 Weight Gain: 0 Sleep: No Change Total Hours of Sleep:  (7-8) Vegetative Symptoms: None  ADLScreening [facility] Assessment Services) Patient's cognitive ability adequate to safely complete daily activities?: Yes Patient able to express need for assistance with ADLs?: Yes Independently performs ADLs?: Yes (appropriate for developmental age)  Abuse/Neglect [facility]) Physical Abuse: Denies Verbal Abuse: Denies Sexual Abuse: Denies  Prior Inpatient Therapy Prior Inpatient Therapy: Yes Prior Therapy Dates: April 2013 Prior Therapy Facilty/Provider(s): [facility] Reason for Treatment: SI  Prior Outpatient Therapy Prior Outpatient Therapy: Yes Prior Therapy Dates: February 2014 to current Prior Therapy Facilty/Provider([facility]): [facility] Reason for Treatment: SA, [facility] program  ADL Screening (condition at time of admission) Patient's cognitive ability adequate to safely complete daily activities?: Yes Patient able to express need for assistance with ADLs?: Yes Independently performs ADLs?: Yes (appropriate for developmental age) Weakness of Legs: Both Weakness of Arms/Hands:  None  Home Assistive Devices/Equipment Home Assistive Devices/Equipment: Cane (specify quad or straight) (Straight cane)    Abuse/Neglect Assessment (Assessment to be complete while patient is alone) Physical Abuse: Denies Verbal Abuse: Denies Sexual Abuse: Denies Exploitation of patient/patient's resources: Denies Self-Neglect: Denies Values / Beliefs Cultural Requests During Hospitalization: None Spiritual Requests During Hospitalization: None   Advance Directives (For Healthcare) Advance Directive: Patient does not have advance directive;Patient would not like information Nutrition Screen- [facility] Adult/[facility]/AP Patient's home diet: Carb modified  Additional Information 1:1 In Past 12 Months?: No CIRT Risk: No Elopement Risk: No Does patient have medical clearance?: Yes     Disposition:  Referred to [facility] for placement; awaiting disposition. Disposition Initial Assessment Completed for this Encounter: Yes Disposition of Patient: Inpatient treatment program;Referred to Type of inpatient treatment program: Adult Patient referred to:  (Pt appropriate for tx at [facility] when beds available.)  On Site Evaluation by:   Reviewed with Physician:     Angelica Ran 10/11/2012 5:26 PM

## 2012-10-11 NOTE — BHH Counselor (Signed)
Pt accepted for inpatient treatment by Donell Sievert PA C to Geoffery Lyons MD. Room 303 2. BH assessment team, Marcus notified. R Lacinda Axon RN, BSN, BC, RNIII

## 2012-10-11 NOTE — ED Notes (Signed)
Report received, assumed care.  

## 2012-10-12 ENCOUNTER — Encounter (HOSPITAL_COMMUNITY): Payer: Self-pay

## 2012-10-12 ENCOUNTER — Inpatient Hospital Stay (HOSPITAL_COMMUNITY)
Admission: EM | Admit: 2012-10-12 | Discharge: 2012-10-15 | DRG: 897 | Disposition: A | Discharge status: Home / Self Care | Payer: Medicaid Other | Source: Intra-hospital | Attending: Psychiatry | Admitting: Psychiatry

## 2012-10-12 DIAGNOSIS — I10 Essential (primary) hypertension: Secondary | ICD-10-CM | POA: Diagnosis present

## 2012-10-12 DIAGNOSIS — E119 Type 2 diabetes mellitus without complications: Secondary | ICD-10-CM | POA: Diagnosis present

## 2012-10-12 DIAGNOSIS — F1020 Alcohol dependence, uncomplicated: Secondary | ICD-10-CM

## 2012-10-12 DIAGNOSIS — F39 Unspecified mood [affective] disorder: Secondary | ICD-10-CM

## 2012-10-12 DIAGNOSIS — Z79899 Other long term (current) drug therapy: Secondary | ICD-10-CM

## 2012-10-12 DIAGNOSIS — F1010 Alcohol abuse, uncomplicated: Principal | ICD-10-CM | POA: Diagnosis present

## 2012-10-12 DIAGNOSIS — F1410 Cocaine abuse, uncomplicated: Secondary | ICD-10-CM

## 2012-10-12 DIAGNOSIS — F319 Bipolar disorder, unspecified: Secondary | ICD-10-CM | POA: Diagnosis present

## 2012-10-12 HISTORY — DX: Unspecified viral hepatitis C without hepatic coma: B19.20

## 2012-10-12 LAB — GLUCOSE, CAPILLARY
Glucose-Capillary: 154 mg/dL — ABNORMAL HIGH (ref 70–99)
Glucose-Capillary: 129 mg/dL — ABNORMAL HIGH (ref 70–99)
Glucose-Capillary: 119 mg/dL — ABNORMAL HIGH (ref 70–99)
Glucose-Capillary: 147 mg/dL — ABNORMAL HIGH (ref 70–99)

## 2012-10-12 LAB — HEMOGLOBIN A1C
Hgb A1c MFr Bld: 6 % — ABNORMAL HIGH (ref <5.7)
Mean Plasma Glucose: 126 mg/dL — ABNORMAL HIGH (ref <117)

## 2012-10-12 MED ORDER — CITALOPRAM HYDROBROMIDE 40 MG PO TABS
40.0000 mg | ORAL_TABLET | Freq: Every day | ORAL | Status: DC
  Administered 2012-10-13 – 2012-10-15 (×3): 40 mg via ORAL
  Filled 2012-10-12 (×4): qty 1
  Filled 2012-10-12: qty 3

## 2012-10-12 MED ORDER — THIAMINE HCL 100 MG/ML IJ SOLN: 100.0000 mg | Freq: Once | INTRAMUSCULAR | Status: DC

## 2012-10-12 MED ORDER — INSULIN GLARGINE 100 UNIT/ML ~~LOC~~ SOLN
30.0000 [IU] | Freq: Every day | SUBCUTANEOUS | Status: DC
  Administered 2012-10-12 – 2012-10-14 (×2): 30 [IU] via SUBCUTANEOUS

## 2012-10-12 MED ORDER — LOSARTAN POTASSIUM 50 MG PO TABS
50.0000 mg | ORAL_TABLET | Freq: Every day | ORAL | Status: DC
  Administered 2012-10-12 – 2012-10-15 (×4): 50 mg via ORAL
  Filled 2012-10-12 (×6): qty 1

## 2012-10-12 MED ORDER — NICOTINE 14 MG/24HR TD PT24
14.0000 mg | MEDICATED_PATCH | Freq: Every day | TRANSDERMAL | Status: DC
  Filled 2012-10-12: qty 1

## 2012-10-12 MED ORDER — CHLORDIAZEPOXIDE HCL 25 MG PO CAPS: 25.0000 mg | ORAL_CAPSULE | ORAL | Status: DC

## 2012-10-12 MED ORDER — CHLORDIAZEPOXIDE HCL 25 MG PO CAPS: 25.0000 mg | ORAL_CAPSULE | Freq: Three times a day (TID) | ORAL | Status: DC

## 2012-10-12 MED ORDER — INSULIN ASPART 100 UNIT/ML ~~LOC~~ SOLN: 0.0000 [IU] | Freq: Every day | SUBCUTANEOUS | Status: DC

## 2012-10-12 MED ORDER — VITAMIN B-1 100 MG PO TABS
100.0000 mg | ORAL_TABLET | Freq: Every day | ORAL | Status: DC
  Administered 2012-10-13 – 2012-10-15 (×3): 100 mg via ORAL
  Filled 2012-10-12 (×5): qty 1

## 2012-10-12 MED ORDER — ALUM & MAG HYDROXIDE-SIMETH 200-200-20 MG/5ML PO SUSP: 30.0000 mL | ORAL | Status: DC | PRN

## 2012-10-12 MED ORDER — LOSARTAN POTASSIUM-HCTZ 50-12.5 MG PO TABS: 1.0000 | ORAL_TABLET | Freq: Every day | ORAL | Status: DC

## 2012-10-12 MED ORDER — HYDROCHLOROTHIAZIDE 12.5 MG PO CAPS
12.5000 mg | ORAL_CAPSULE | Freq: Every day | ORAL | Status: DC
  Administered 2012-10-12 – 2012-10-15 (×4): 12.5 mg via ORAL
  Filled 2012-10-12 (×7): qty 1

## 2012-10-12 MED ORDER — CHLORDIAZEPOXIDE HCL 25 MG PO CAPS
25.0000 mg | ORAL_CAPSULE | Freq: Four times a day (QID) | ORAL | Status: DC
  Filled 2012-10-12: qty 1

## 2012-10-12 MED ORDER — ONDANSETRON 4 MG PO TBDP: 4.0000 mg | ORAL_TABLET | Freq: Four times a day (QID) | ORAL | Status: AC | PRN

## 2012-10-12 MED ORDER — CITALOPRAM HYDROBROMIDE 20 MG PO TABS
20.0000 mg | ORAL_TABLET | Freq: Every day | ORAL | Status: DC
  Administered 2012-10-12: 20 mg via ORAL
  Filled 2012-10-12 (×3): qty 1

## 2012-10-12 MED ORDER — HYDROXYZINE HCL 25 MG PO TABS: 25.0000 mg | ORAL_TABLET | Freq: Four times a day (QID) | ORAL | Status: AC | PRN

## 2012-10-12 MED ORDER — VALACYCLOVIR HCL 500 MG PO TABS
1000.0000 mg | ORAL_TABLET | Freq: Every day | ORAL | Status: DC
  Administered 2012-10-12 – 2012-10-15 (×4): 1000 mg via ORAL
  Filled 2012-10-12 (×6): qty 2

## 2012-10-12 MED ORDER — MAGNESIUM HYDROXIDE 400 MG/5ML PO SUSP
30.0000 mL | Freq: Every day | ORAL | Status: DC | PRN
  Administered 2012-10-12: 30 mL via ORAL

## 2012-10-12 MED ORDER — TRAZODONE HCL 100 MG PO TABS
300.0000 mg | ORAL_TABLET | Freq: Every day | ORAL | Status: DC
  Administered 2012-10-12 – 2012-10-14 (×3): 300 mg via ORAL
  Filled 2012-10-12: qty 9
  Filled 2012-10-12 (×4): qty 2

## 2012-10-12 MED ORDER — METHOCARBAMOL 500 MG PO TABS
500.0000 mg | ORAL_TABLET | Freq: Four times a day (QID) | ORAL | Status: DC
  Administered 2012-10-12 – 2012-10-15 (×13): 500 mg via ORAL
  Filled 2012-10-12 (×5): qty 1
  Filled 2012-10-12: qty 12
  Filled 2012-10-12 (×8): qty 1
  Filled 2012-10-12 (×3): qty 12
  Filled 2012-10-12 (×3): qty 1

## 2012-10-12 MED ORDER — MAGNESIUM CITRATE PO SOLN
1.0000 | Freq: Once | ORAL | Status: AC
  Administered 2012-10-12: 1 via ORAL

## 2012-10-12 MED ORDER — CHLORDIAZEPOXIDE HCL 25 MG PO CAPS: 25.0000 mg | ORAL_CAPSULE | Freq: Every day | ORAL | Status: DC

## 2012-10-12 MED ORDER — INSULIN ASPART 100 UNIT/ML ~~LOC~~ SOLN
0.0000 [IU] | Freq: Three times a day (TID) | SUBCUTANEOUS | Status: DC
  Administered 2012-10-12: 2 [IU] via SUBCUTANEOUS
  Administered 2012-10-12 – 2012-10-13 (×2): 3 [IU] via SUBCUTANEOUS
  Administered 2012-10-13: 2 [IU] via SUBCUTANEOUS
  Administered 2012-10-13: 17:00:00 via SUBCUTANEOUS
  Administered 2012-10-14 (×2): 2 [IU] via SUBCUTANEOUS
  Administered 2012-10-14: 5 [IU] via SUBCUTANEOUS
  Administered 2012-10-15: 2 [IU] via SUBCUTANEOUS
  Administered 2012-10-15: 3 [IU] via SUBCUTANEOUS

## 2012-10-12 MED ORDER — GABAPENTIN 400 MG PO CAPS
400.0000 mg | ORAL_CAPSULE | Freq: Three times a day (TID) | ORAL | Status: DC
  Filled 2012-10-12 (×3): qty 1

## 2012-10-12 MED ORDER — ACETAMINOPHEN 325 MG PO TABS: 650.0000 mg | ORAL_TABLET | Freq: Four times a day (QID) | ORAL | Status: DC | PRN

## 2012-10-12 MED ORDER — GABAPENTIN 300 MG PO CAPS
300.0000 mg | ORAL_CAPSULE | Freq: Three times a day (TID) | ORAL | Status: DC
  Administered 2012-10-12: 300 mg via ORAL
  Filled 2012-10-12 (×5): qty 1

## 2012-10-12 MED ORDER — LITHIUM CARBONATE 300 MG PO CAPS
300.0000 mg | ORAL_CAPSULE | Freq: Two times a day (BID) | ORAL | Status: DC
  Administered 2012-10-12: 300 mg via ORAL
  Filled 2012-10-12 (×4): qty 1

## 2012-10-12 MED ORDER — IBUPROFEN 800 MG PO TABS
800.0000 mg | ORAL_TABLET | Freq: Three times a day (TID) | ORAL | Status: DC
  Administered 2012-10-12 – 2012-10-15 (×10): 800 mg via ORAL
  Filled 2012-10-12 (×16): qty 1

## 2012-10-12 MED ORDER — GABAPENTIN 400 MG PO CAPS
400.0000 mg | ORAL_CAPSULE | Freq: Four times a day (QID) | ORAL | Status: DC
  Administered 2012-10-12 – 2012-10-15 (×13): 400 mg via ORAL
  Filled 2012-10-12 (×5): qty 1
  Filled 2012-10-12 (×2): qty 12
  Filled 2012-10-12: qty 1
  Filled 2012-10-12 (×2): qty 12
  Filled 2012-10-12 (×10): qty 1

## 2012-10-12 MED ORDER — LITHIUM CARBONATE 300 MG PO CAPS
300.0000 mg | ORAL_CAPSULE | Freq: Three times a day (TID) | ORAL | Status: DC
  Administered 2012-10-12 – 2012-10-15 (×10): 300 mg via ORAL
  Filled 2012-10-12 (×5): qty 1
  Filled 2012-10-12 (×2): qty 9
  Filled 2012-10-12 (×4): qty 1
  Filled 2012-10-12: qty 9
  Filled 2012-10-12 (×3): qty 1

## 2012-10-12 MED ORDER — ADULT MULTIVITAMIN W/MINERALS CH
1.0000 | ORAL_TABLET | Freq: Every day | ORAL | Status: DC
  Administered 2012-10-12 – 2012-10-15 (×4): 1 via ORAL
  Filled 2012-10-12 (×6): qty 1

## 2012-10-12 MED ORDER — IBUPROFEN 800 MG PO TABS
800.0000 mg | ORAL_TABLET | Freq: Two times a day (BID) | ORAL | Status: DC
  Administered 2012-10-12: 800 mg via ORAL
  Filled 2012-10-12 (×4): qty 1

## 2012-10-12 MED ORDER — NICOTINE 21 MG/24HR TD PT24
21.0000 mg | MEDICATED_PATCH | Freq: Every day | TRANSDERMAL | Status: DC
  Administered 2012-10-12 – 2012-10-15 (×4): 21 mg via TRANSDERMAL
  Filled 2012-10-12 (×6): qty 1

## 2012-10-12 MED ORDER — CHLORDIAZEPOXIDE HCL 25 MG PO CAPS: 25.0000 mg | ORAL_CAPSULE | Freq: Four times a day (QID) | ORAL | Status: AC | PRN

## 2012-10-12 MED ORDER — LOPERAMIDE HCL 2 MG PO CAPS: 2.0000 mg | ORAL_CAPSULE | ORAL | Status: AC | PRN

## 2012-10-12 MED ORDER — GABAPENTIN 300 MG PO CAPS: 300.0000 mg | ORAL_CAPSULE | Freq: Every day | ORAL | Status: DC

## 2012-10-12 MED ORDER — PANTOPRAZOLE SODIUM 40 MG PO TBEC
40.0000 mg | DELAYED_RELEASE_TABLET | Freq: Every day | ORAL | Status: DC
  Administered 2012-10-12 – 2012-10-15 (×4): 40 mg via ORAL
  Filled 2012-10-12 (×6): qty 1
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
## 2012-10-12 NOTE — Progress Notes (Signed)
Adult Psychoeducational Group Note  Date:  10/12/2012 Time:  11:46 AM  Group Topic/Focus:  Recovery Goals:   The focus of this group is to identify appropriate goals for recovery and establish a plan to achieve them.  Participation Level:  Active  Participation Quality:  Attentive  Affect:  Appropriate  Cognitive:  Patient remained attentive and actively engaged in the group session both the written aspects as well as the discussion.   Insight: Patient was able to verbally express his triggers that leads to his substance abuse. He was open to Staff's input and suggestions.   Engagement in Group:  Engaged  Modes of Intervention:  Discussion and corresponding worksheet activities.   Additional Comments:  Patient expressed during group that he "thinks about suicide everyday" and expressed that his main trigger was his back pain. RN was notified of these statements.   [name] R 10/12/2012, 11:46 AM

## 2012-10-12 NOTE — Tx Team (Signed)
Initial Interdisciplinary Treatment Plan  PATIENT STRENGTHS: (choose at least two) Ability for insight Active sense of humor Average or above average intelligence General fund of knowledge Motivation for treatment/growth Involvement in a support group   PATIENT STRESSORS: Health problems Substance abuse Pt currently denies any substance use.Marland Kitchen However he was (+) for cocaine and ETOH.   PROBLEM LIST: Problem List/Patient Goals Date to be addressed Date deferred Reason deferred Estimated date of resolution  Increased risk for SI 5/13     Substance Abuse (denial on admission)  5/13     Depression 5/13     Tolerating pain to decrease thoughts of SI 5/13                                    DISCHARGE CRITERIA:  Improved stabilization in mood, thinking, and/or behavior Motivation to continue treatment in a less acute level of care Need for constant or close observation no longer present Reduction of life-threatening or endangering symptoms to within safe limits Verbal commitment to aftercare and medication compliance  PRELIMINARY DISCHARGE PLAN: Attend PHP/IOP Attend 12-step recovery group Return to previous living arrangement  PATIENT/FAMIILY INVOLVEMENT: This treatment plan has been presented to and reviewed with the patient, Billy [name].  The patient and family have been given the opportunity to ask questions and make suggestions.  Billy [name] A 10/12/2012, 5:24 AM

## 2012-10-12 NOTE — [facility] Group Notes (Signed)
[facility] LCSW Aftercare Discharge Planning Group Note   10/12/2012  8:45 AM  Participation Quality:  Appropriate  Mood/Affect:  Depressed, flat  Depression Rating:  9  Anxiety Rating:  6  Thoughts of Suicide:  No Will you contract for safety?   NA  Current AVH:  No  Plan for Discharge/Comments:  [facility]  Transportation Means: Bus probably  Supports: [facility] group which has been big help with not drinking since going there.  Patient reports relapse after 11 weeks due to physical pain and [facility] is [facility]stance Abuse [facility].   Billy [name]

## 2012-10-12 NOTE — BH Assessment (Signed)
[facility] Assessment Progress Note   Pt has been accepted to [facility] by Melvenia Beam to Dr. Dub Mikes.  Room 303-2.  Patient has signed consent to release.  Pt is on IVC so [facility] to transport.  Dr. Hyacinth Meeker [facility]) notified as well as nurse.

## 2012-10-12 NOTE — Progress Notes (Signed)
Pt has been up and active in the milieu today.  He rated his depression a 8 hopelessness a 6 and anxiety a 5 on his self-inventory.  He denies any S/H ideation or A/V hallucination.  Pt was on librium protocol and he refused his 0800 dose he stated,"I really only drank very little I don't believe I need the librium"  Pt spoke with Dr. Dub Mikes and he d/c'd the protocol and pt never received the firs dose today.  He thinks he will go back to [facility] and does not want to return to his current residence He plans to move into another place and has been on the phone talking with his support person.
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
## 2012-10-12 NOTE — [facility] Group Notes (Signed)
[facility] LCSW Group Therapy  10/12/2012  1:15 PM  Type of Therapy:  Group Therapy 1:15 to 2:30 PM  Participation Level:  Active  Participation Quality:  Monopolizing  Affect:  Defensive  Cognitive:  Alert and Oriented  Insight:  Limited  Engagement in Therapy:  Engaged  Modes of Intervention:  Discussion, Education, Exploration and Support  Summary of Progress/Problems: Patient attended group presentation by staff member of  [facility] ([facility]). Billy [name] was attentive and shared multiple times during session to the point that CSW set limits. Patient shared his belief that "I can manage the alcohol and I resent that it is so difficult to obtain opiates these days, I mean that is why I try to hurt myself so ya'll lighten up and give me the pain meds I need for a week."  Others attempted to point out the reality of trying other methods than the opiates and pt admitted he was closed minded to other options.   Clide Dales

## 2012-10-12 NOTE — [facility] Suicide Risk Assessment (Signed)
Suicide Risk Assessment  Admission Assessment     Nursing information obtained from:  Patient Demographic factors:  Male;Divorced or widowed;Caucasian;Low socioeconomic status;Unemployed Current Mental Status:  NA (recent Suicide attempt. Pt does contract for safety) Loss Factors:  Loss of significant relationship;Decline in physical health;Legal issues;Financial problems / change in socioeconomic status Historical Factors:  Prior suicide attempts;Anniversary of important loss Risk Reduction Factors:  Sense of responsibility to family;Positive social support;Positive therapeutic relationship;Positive coping skills or problem solving skills  CLINICAL FACTORS:   Bipolar Disorder:   Bipolar II Alcohol/Substance Abuse/Dependencies  COGNITIVE FEATURES THAT CONTRIBUTE TO RISK:  Closed-mindedness Polarized thinking Thought constriction (tunnel vision)    SUICIDE RISK:   Moderate:  Frequent suicidal ideation with limited intensity, and duration, some specificity in terms of plans, no associated intent, good self-control, limited dysphoria/symptomatology, some risk factors present, and identifiable protective factors, including available and accessible social support.  PLAN OF CARE: Supportive approach/coping skills/relapse prevention                               Optimize treatment for his mood disorder                                Optimize pain management  I certify that inpatient services furnished can reasonably be expected to improve the patient's condition.  Dajuana Palen A 10/12/2012, 12:48 PM
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
## 2012-10-12 NOTE — ED Provider Notes (Signed)
  Physical Exam  BP 111/69  Pulse 60  Temp(Src) 97.7 F (36.5 C) (Oral)  Resp 16  SpO2 98%  Physical Exam  ED Course  Procedures  MDM Pt has been accepted by Dr. Dub Mikes at [facility] per ACT team.      Vida Roller, MD 10/12/12 [phone]

## 2012-10-13 DIAGNOSIS — F1010 Alcohol abuse, uncomplicated: Principal | ICD-10-CM

## 2012-10-13 DIAGNOSIS — F319 Bipolar disorder, unspecified: Secondary | ICD-10-CM

## 2012-10-13 LAB — GLUCOSE, CAPILLARY: Glucose-Capillary: 171 mg/dL — ABNORMAL HIGH (ref 70–99)

## 2012-10-13 MED ORDER — LIDOCAINE 5 % EX PTCH
1.0000 | MEDICATED_PATCH | Freq: Every day | CUTANEOUS | Status: DC
  Administered 2012-10-14 – 2012-10-15 (×2): 1 via TRANSDERMAL
  Filled 2012-10-13 (×2): qty 1
  Filled 2012-10-13: qty 3
  Filled 2012-10-13: qty 1

## 2012-10-13 MED ORDER — LIDOCAINE 5 % EX PTCH
1.0000 | MEDICATED_PATCH | CUTANEOUS | Status: DC
Start: 2012-10-13 — End: 2012-10-13

## 2012-10-13 MED ORDER — LIDOCAINE 5 % EX PTCH
1.0000 | MEDICATED_PATCH | Freq: Once | CUTANEOUS | Status: AC
  Administered 2012-10-13: 1 via TRANSDERMAL
  Filled 2012-10-13: qty 1

## 2012-10-13 NOTE — Progress Notes (Signed)
D- Patient is out in dayroom interacting with peers and attending groups.  Rates depression at 7 and hopelessness at 6. Denies SI.  Reports good sleep and appetite.  C/O "lower back pain" and scheduled lidoderm patch applied.  A-Support and encouragement given. Continue current POC and evaluation of treatment goals.  Continue 15' checks for safety.R- Verbalizes desire for long term sobriety. Safty maintained.
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
## 2012-10-13 NOTE — [facility] Group Notes (Signed)
[facility] LCSW Aftercare Discharge Planning Group Note   10/13/2012  8:45 AM  Participation Quality:  Appropriate   Mood/Affect:  Flat  Depression Rating:  7  Anxiety Rating:  5  Thoughts of Suicide:  No Will you contract for safety?   NA  Current AVH:  No  Plan for Discharge/Comments:  Continue to followup with services provided through [facility]  Transportation Means:  Bus vouchers  Supports: [facility] [employer], Julious Payer

## 2012-10-13 NOTE — [facility] Group Notes (Signed)
[facility] LCSW Group Therapy  10/13/2012 1:15  PM  Type of Therapy:  Group Therapy 1:15 to 2:30 PM  Participation Level:  Active  Participation Quality:  Intrusive, Redirectable and Sharing  Affect:  Defensive and Excited  Cognitive:  Alert and Oriented  Insight:  Limited  Engagement in Therapy:  Engaged  Modes of Intervention:  Discussion, Exploration, Limit-setting, Socialization and Support  Summary of Progress/Problems: The focus of this group session was to process how we deal with difficult emotions and share with others the patterns that play out when we are reacting to the emotion verses the situation.  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  I got a lot of guilt and I sometimes yeah have used over that.  Something else I get frustrated with is my roommates not being responsible and not taking out the trash."  Patient was interested in sharing with another group member the possibility that we can change our mind.    Billy [name]

## 2012-10-13 NOTE — Progress Notes (Signed)
Pt has been to the nurse's station several times making requests.  He reports he has been constipated since Saturday.  He was given a bottle of mag citrate per order and told to inform staff if he has results.  Pt states he is still depressed and anxious.  He contracts for safety.  He denies HI/AV.  He says he has been going to groups.  He is unsure when he will be discharged.  He wants to find another place to live.  He says the staff of [facility] is going to help him.  He is still focused on his chronic back pain and feels he needs opiates for the pain.  Pt encouraged to find other ways to cope with his pain.  Support and encouragement offered.  Safety maintained with q15 minute checks.

## 2012-10-13 NOTE — Tx Team (Signed)
Interdisciplinary Treatment Plan Update (Adult)  Date: 10/13/2012  Time Reviewed: 9:40 AM   Progress in Treatment: Attending groups: Yes Participating in groups: Yes Taking medication as prescribed:  Yes Tolerating medication:  Yes Family/Significant othe contact made: Not as yet Patient understands diagnosis: Yes Discussing patient identified problems/goals with staff: Yes Medical problems stabilized or resolved:  Yes Denies suicidal/homicidal ideation: Yes/No Patient has not harmed self or Others: Yes  New problem(s) identified: None Identified  Discharge Plan or Barriers:  CSW is assessing for appropriate referrals.   Additional comments: N/A  Reason for Continuation of Hospitalization: Anxiety Depression Withdrawal symptoms  Estimated length of stay: 2 days  For review of initial/current patient goals, please see plan of care.  Attendees: Patient:     Family:     Physician:  Geoffery Lyons 10/13/2012 9:40 AM   Nursing:   Roswell Miners, RN 10/13/2012 9:40 AM   Clinical Social Worker Ronda Fairly 10/13/2012 9:40 AM   Other:  Robbie Louis, RN 10/13/2012 9:40 AM   Other:  Liliane Bade, Community Care Coordinator 10/13/2012 9:40 AM   Other:  Serena Colonel, NP 10/13/2012 9:40 AM   Other:   10/13/2012 9:40 AM    Scribe for Treatment Team:   Carney Bern, LCSWA  10/13/2012 9:40 AM

## 2012-10-14 LAB — GLUCOSE, CAPILLARY: Glucose-Capillary: 243 mg/dL — ABNORMAL HIGH (ref 70–99)

## 2012-10-14 NOTE — Progress Notes (Signed)
D: Patient appropriate and cooperative with staff and peers. Patient's affect/mood is anxious. He reported on the self inventory sheet that he slept well, appetite is good, energy level is normal and ability to pay attention is improving. Patient rated depression "5" and feelings of hopelessness "6". He's attending/participating in groups and interacting with peers in the milieu.  A: Support and encouragement provided to patient. Scheduled medications administered per MD orders. Maintain Q15 minute checks for safety.  R: Patient receptive. Denies SI/HI/AVH. Patient remains safe.

## 2012-10-14 NOTE — Progress Notes (Signed)
[facility] MD Progress Note  10/14/2012 5:57 PM KYLON PHILBROOK  MRN:  [id number] Subjective:  Micahel endorses that he is starting to feel better. The patch is helping. He is soon going to get his medicaid changed and it should be able to cover his pain management. He is going to pursue his medications further when he is discharged.  Diagnosis:  Bipolar Disorder, Alcohol Abuse  ADL's:  Intact  Sleep: Fair  Appetite:  Fair  Suicidal Ideation:  Plan:  denies Intent:  denies Means:  denies Homicidal Ideation:  Plan:  denies Intent:  denies Means:  denies AEB (as evidenced by):  Psychiatric Specialty Exam: Review of Systems  Constitutional: Negative.   HENT: Negative.   Eyes: Negative.   Respiratory: Negative.   Cardiovascular: Negative.   Gastrointestinal: Negative.   Genitourinary: Negative.   Musculoskeletal: Negative.   Skin: Negative.   Neurological: Negative.   Endo/Heme/Allergies: Negative.   Psychiatric/Behavioral: Positive for substance abuse. The patient is nervous/anxious.     Blood pressure 116/76, pulse 70, temperature 98.6 F (37 C), temperature source Oral, resp. rate 18, height 5\' 9"  (1.753 m), weight 97.07 kg (214 lb).Body mass index is 31.59 kg/(m^2).  General Appearance: Fairly Groomed  Patent attorney::  Fair  Speech:  Clear and Coherent  Volume:  Normal  Mood:  worried  Affect:  Restricted  Thought Process:  Coherent and Goal Directed  Orientation:  Full (Time, Place, and Person)  Thought Content:  worries, concerns  Suicidal Thoughts:  No  Homicidal Thoughts:  No  Memory:  Immediate;   Fair Recent;   Fair Remote;   Fair  Judgement:  Fair  Insight:  Present  Psychomotor Activity:  Restlessness  Concentration:  Fair  Recall:  Fair  Akathisia:  No  Handed:  Right  AIMS (if indicated):     Assets:  Desire for Improvement  Sleep:  Number of Hours: 6.75   Current Medications: Current Facility-Administered Medications  Medication Dose Route Frequency  Provider Last Rate Last Dose  . acetaminophen (TYLENOL) tablet 650 mg  650 mg Oral Q6H PRN Kerry Hough, PA-C      . alum & mag hydroxide-simeth (MAALOX/MYLANTA) 200-200-20 MG/5ML suspension 30 mL  30 mL Oral Q4H PRN Kerry Hough, PA-C      . chlordiazePOXIDE (LIBRIUM) capsule 25 mg  25 mg Oral Q6H PRN Kerry Hough, PA-C      . citalopram (CELEXA) tablet 40 mg  40 mg Oral Daily Rachael Fee, MD   40 mg at 10/14/12 0815  . gabapentin (NEURONTIN) capsule 400 mg  400 mg Oral QID Rachael Fee, MD   400 mg at 10/14/12 1704  . losartan (COZAAR) tablet 50 mg  50 mg Oral Daily Rachael Fee, MD   50 mg at 10/14/12 0815   And  . hydrochlorothiazide (MICROZIDE) capsule 12.5 mg  12.5 mg Oral Daily Rachael Fee, MD   12.5 mg at 10/14/12 0815  . hydrOXYzine (ATARAX/VISTARIL) tablet 25 mg  25 mg Oral Q6H PRN Kerry Hough, PA-C      . ibuprofen (ADVIL,MOTRIN) tablet 800 mg  800 mg Oral TID Rachael Fee, MD   800 mg at 10/14/12 1704  . insulin aspart (novoLOG) injection 0-15 Units  0-15 Units Subcutaneous TID WC Kerry Hough, PA-C   5 Units at 10/14/12 1706  . insulin aspart (novoLOG) injection 0-5 Units  0-5 Units Subcutaneous QHS Kerry Hough, PA-C      .  insulin glargine (LANTUS) injection 30 Units  30 Units Subcutaneous QHS Kerry Hough, PA-C   30 Units at 10/12/12 2136  . lidocaine (LIDODERM) 5 % 1 patch  1 patch Transdermal Daily Rachael Fee, MD   1 patch at 10/14/12 [phone]  . lithium carbonate capsule 300 mg  300 mg Oral TID WC Rachael Fee, MD   300 mg at 10/14/12 1704  . loperamide (IMODIUM) capsule 2-4 mg  2-4 mg Oral PRN Kerry Hough, PA-C      . magnesium hydroxide (MILK OF MAGNESIA) suspension 30 mL  30 mL Oral Daily PRN Kerry Hough, PA-C   30 mL at 10/12/12 0840  . methocarbamol (ROBAXIN) tablet 500 mg  500 mg Oral QID Rachael Fee, MD   500 mg at 10/14/12 1703  . multivitamin with minerals tablet 1 tablet  1 tablet Oral Daily Kerry Hough, PA-C   1 tablet at  10/14/12 9604  . nicotine (NICODERM CQ - dosed in mg/24 hours) patch 21 mg  21 mg Transdermal Q0600 Kerry Hough, PA-C   21 mg at 10/14/12 5409  . ondansetron (ZOFRAN-ODT) disintegrating tablet 4 mg  4 mg Oral Q6H PRN Kerry Hough, PA-C      . pantoprazole (PROTONIX) EC tablet 40 mg  40 mg Oral Daily Kerry Hough, PA-C   40 mg at 10/14/12 8119  . thiamine (B-1) injection 100 mg  100 mg Intramuscular Once [employer], PA-C      . thiamine (VITAMIN B-1) tablet 100 mg  100 mg Oral Daily Kerry Hough, PA-C   100 mg at 10/14/12 0800  . traZODone (DESYREL) tablet 300 mg  300 mg Oral QHS Kerry Hough, PA-C   300 mg at 10/13/12 2114  . valACYclovir (VALTREX) tablet 1,000 mg  1,000 mg Oral Daily Kerry Hough, PA-C   1,000 mg at 10/14/12 1478    Lab Results:  Results for orders placed during the hospital encounter of 10/12/12 (from the past 48 hour(s))  GLUCOSE, CAPILLARY     Status: Abnormal   Collection Time    10/12/12  9:34 PM      Result Value Range   Glucose-Capillary 147 (*) 70 - 99 mg/dL  GLUCOSE, CAPILLARY     Status: Abnormal   Collection Time    10/13/12  6:18 AM      Result Value Range   Glucose-Capillary 164 (*) 70 - 99 mg/dL  GLUCOSE, CAPILLARY     Status: Abnormal   Collection Time    10/13/12 11:58 AM      Result Value Range   Glucose-Capillary 122 (*) 70 - 99 mg/dL  GLUCOSE, CAPILLARY     Status: Abnormal   Collection Time    10/13/12  4:57 PM      Result Value Range   Glucose-Capillary 171 (*) 70 - 99 mg/dL  GLUCOSE, CAPILLARY     Status: Abnormal   Collection Time    10/13/12  9:07 PM      Result Value Range   Glucose-Capillary 172 (*) 70 - 99 mg/dL  GLUCOSE, CAPILLARY     Status: Abnormal   Collection Time    10/14/12  6:08 AM      Result Value Range   Glucose-Capillary 131 (*) 70 - 99 mg/dL  GLUCOSE, CAPILLARY     Status: Abnormal   Collection Time    10/14/12 11:40 AM      Result Value Range  Glucose-Capillary 132 (*) 70 - 99 mg/dL   GLUCOSE, CAPILLARY     Status: Abnormal   Collection Time    10/14/12  4:53 PM      Result Value Range   Glucose-Capillary 243 (*) 70 - 99 mg/dL    Physical Findings: AIMS: Facial and Oral Movements Muscles of Facial Expression: None, normal Lips and Perioral Area: None, normal Jaw: None, normal Tongue: None, normal,Extremity Movements Upper (arms, wrists, hands, fingers): None, normal Lower (legs, knees, ankles, toes): None, normal, Trunk Movements Neck, shoulders, hips: None, normal, Overall Severity Severity of abnormal movements (highest score from questions above): None, normal Incapacitation due to abnormal movements: None, normal Patient's awareness of abnormal movements (rate only patient's report): No Awareness, Dental Status Current problems with teeth and/or dentures?: Yes Does patient usually wear dentures?: Yes  CIWA:  CIWA-Ar Total: 1 COWS:     Treatment Plan Summary: Daily contact with patient to assess and evaluate symptoms and progress in treatment Medication management  Plan: Supportive approach/coping skills/relapse prevention           Continue to optimize treatment for his mood disorder and pain  Medical Decision Making Problem Points:  Review of psycho-social stressors (1) Data Points:  Review of medication regiment & side effects (2)  I certify that inpatient services furnished can reasonably be expected to improve the patient's condition.   Claron Rosencrans A 10/14/2012, 5:57 PM

## 2012-10-14 NOTE — Progress Notes (Signed)
Recreation Therapy Notes  Date: 05.15.2014 Time: 3:00pm Location: [address] Dayroom      Group Topic/Focus: Leisure Education  Participation Level: Did not attend  [employer], LRT/CTRS  Jearl Klinefelter 10/14/2012 4:21 PM

## 2012-10-14 NOTE — Progress Notes (Signed)
Patient did attend the evening karaoke group. Pt was attentive and supportive.   

## 2012-10-14 NOTE — [facility] Group Notes (Signed)
[facility] LCSW Aftercare Discharge Planning Group Note   10/14/2012 8:45 AM  Participation Quality:  Appropriate  Mood/Affect:  Appropriate  Depression Rating:  6  Anxiety Rating:  5  Thoughts of Suicide:  No Will you contract for safety?   NA  Current AVH:  Negative  Plan for Discharge/Comments:  Return to [facility] and also help with housing  Transportation Means: Bus Vouchers  Supports: [facility] Team  Dyane Dustman, Julious Payer

## 2012-10-14 NOTE — [facility] Group Notes (Signed)
[facility] LCSW Group Therapy  10/14/2012  1:15 PM   Type of Therapy:  Group Therapy  Participation Level:  Active  Participation Quality:  Appropriate and Attentive  Affect:  Appropriate  Cognitive:  Alert and Appropriate  Insight:  Developing/Improving and Engaged  Engagement in Therapy:  Developing/Improving and Engaged  Modes of Intervention:  Clarification, Confrontation, Discussion, Education, Exploration, Limit-setting, Orientation, Problem-solving, Rapport Building, Socialization and Support  Summary of Progress/Problems: The topic for group was balance in life.  Pt participated in the discussion about when their life was in balance and out of balance and how this feels.  Pt discussed ways to get back in balance and short term goals they can work on to get where they want to be.  Pt shared that he needs to put actions to his thoughts, but that it is easier said than done.  Pt shared that he once had a balanced life when he was married, had 2 young kids and a good job.  Pt was able to process his poor living conditions and how it makes it difficult for him to stay clean and sober.  Pt often gets off topic but is easily redirected.    Billy [name], LCSWA 10/14/2012 2:30 PM

## 2012-10-15 LAB — GLUCOSE, CAPILLARY
Glucose-Capillary: 148 mg/dL — ABNORMAL HIGH (ref 70–99)
Glucose-Capillary: 155 mg/dL — ABNORMAL HIGH (ref 70–99)

## 2012-10-15 MED ORDER — ESOMEPRAZOLE MAGNESIUM 40 MG PO CPDR: 40.0000 mg | DELAYED_RELEASE_CAPSULE | Freq: Every day | ORAL | Status: DC

## 2012-10-15 MED ORDER — TRAZODONE HCL 300 MG PO TABS: 300.0000 mg | ORAL_TABLET | Freq: Every day | ORAL | Status: DC

## 2012-10-15 MED ORDER — CITALOPRAM HYDROBROMIDE 40 MG PO TABS: 40.0000 mg | ORAL_TABLET | Freq: Every day | ORAL | Status: DC

## 2012-10-15 MED ORDER — IBUPROFEN 800 MG PO TABS: 800.0000 mg | ORAL_TABLET | Freq: Two times a day (BID) | ORAL | Status: DC

## 2012-10-15 MED ORDER — VALACYCLOVIR HCL 1 G PO TABS: 1000.0000 mg | ORAL_TABLET | Freq: Every day | ORAL | Status: DC

## 2012-10-15 MED ORDER — INSULIN GLARGINE 100 UNIT/ML ~~LOC~~ SOLN: 30.0000 [IU] | Freq: Every day | SUBCUTANEOUS | Status: DC

## 2012-10-15 MED ORDER — LIDOCAINE 5 % EX PTCH: 1.0000 | MEDICATED_PATCH | Freq: Every day | CUTANEOUS | Status: DC

## 2012-10-15 MED ORDER — METHOCARBAMOL 500 MG PO TABS: 500.0000 mg | ORAL_TABLET | Freq: Four times a day (QID) | ORAL | Status: DC

## 2012-10-15 MED ORDER — LITHIUM CARBONATE 300 MG PO CAPS: 300.0000 mg | ORAL_CAPSULE | Freq: Three times a day (TID) | ORAL | Status: DC

## 2012-10-15 MED ORDER — LOSARTAN POTASSIUM-HCTZ 50-12.5 MG PO TABS: 1.0000 | ORAL_TABLET | Freq: Every day | ORAL | Status: DC

## 2012-10-15 MED ORDER — GABAPENTIN 400 MG PO CAPS: 400.0000 mg | ORAL_CAPSULE | Freq: Four times a day (QID) | ORAL | Status: DC

## 2012-10-15 NOTE — [facility] Group Notes (Signed)
[facility] LCSW Aftercare Discharge Planning Group Note   10/15/2012 8:45 AM  Participation Quality:  Appropriate  Mood/Affect:  Appropriate  Depression Rating:  6, which patient reports is his baseline  Anxiety Rating:  5 which patient reports is his baseling  Thoughts of Suicide:  No Will you contract for safety?   NA  Current AVH:  No  Plan for Discharge/Comments:  Return to rooming house for the weekend and follow up at  [facility]   Transportation Means: Bus  Supports: [facility] [employer], Julious Payer

## 2012-10-15 NOTE — [facility] Suicide Risk Assessment (Signed)
Suicide Risk Assessment  Discharge Assessment     Demographic Factors:  Male and Caucasian  Mental Status Per Nursing Assessment::   On Admission:  NA (recent Suicide attempt. Pt does contract for safety)  Current Mental Status by Physician: In full contact with reality. There are no suicidal ideas, plans or intent. His mood is euthymic, his affect is appropriate. He states that he is feeling better. He is going to be going back to the pain clinic once he gets his medicaid straight. He is committed to abstinence   Loss Factors: Decline in physical health  Historical Factors: NA  Risk Reduction Factors:   Positive social support and Positive therapeutic relationship  Continued Clinical Symptoms:  Bipolar Disorder:   Bipolar II Alcohol/Substance Abuse/Dependencies  Cognitive Features That Contribute To Risk:  Closed-mindedness Thought constriction (tunnel vision)    Suicide Risk:  Minimal: No identifiable suicidal ideation.  Patients presenting with no risk factors but with morbid ruminations; may be classified as minimal risk based on the severity of the depressive symptoms  Discharge Diagnoses:   AXIS I:  Alcohol Abuse, Bipolar Disorder Unspecified AXIS II:  Deferred AXIS III:   Past Medical History  Diagnosis Date  . Suicide attempt   . Diabetes mellitus without complication   . Hypertension   . Bipolar 1 disorder   . HCV (hepatitis C virus)    AXIS IV:  other psychosocial or environmental problems AXIS V:  61-70 mild symptoms  Plan Of Care/Follow-up recommendations:  Activity:  as tolerated Diet:  regular Will follow up with [facility]" Is patient on multiple antipsychotic therapies at discharge:  No   Has Patient had three or more failed trials of antipsychotic monotherapy by history:  No  Recommended Plan for Multiple Antipsychotic Therapies: N/A   Denesha Brouse A 10/15/2012, 9:34 AM
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
## 2012-10-15 NOTE — Progress Notes (Signed)
Discharge Note: Discharge instructions/prescriptions/medication samples/two bus passes given to patient. Patient verbalized understanding of discharge instructions and prescriptions. Returned belongings to patient. Denies SI/HI/AVH. Patient d/c without incident to the front lobby.

## 2012-10-15 NOTE — [facility] Group Notes (Signed)
[facility] LCSW Group Therapy  10/15/2012  1:15 PM  Type of Therapy:  Group Therapy  Participation Level:  Did Not Attend  Billy [name]  

## 2012-10-15 NOTE — [facility] Suicide Risk Assessment (Signed)
[facility] INPATIENT:  Family/Significant Other Suicide Prevention Education  Suicide Prevention Education:  Patient Refusal for Family/Significant Other Suicide Prevention Education: The patient Billy [name] has refused to provide written consent for family/significant other to be provided Family/Significant Other Suicide Prevention Education during admission and/or prior to discharge.  Physician notified. Writer provided suicide prevention education directly to patient; conversation included risk factors, warning signs and resources to contact for help. Mobile crisis services explained and contact card placed in chart for pt to receive at discharge. Conversation with patient took place before patient's went to lunch at approximately 11:45 AM today.  Clide Dales 10/15/2012, 5:50 PM

## 2012-10-15 NOTE — Progress Notes (Signed)
[facility] Adult Case Management Discharge Plan :  Will you be returning to the same living situation after discharge: Yes,  rooming house but expect to move to better place next week At discharge, do you have transportation home?:Yes,  bus vouchers provided at [facility] Do you have the ability to pay for your medications:Yes,  through [facility]  Release of information consent forms completed and in the chart;  Patient's signature needed at discharge.  Patient to Follow up at: Follow-up Information   Follow up with [facility] On 10/13/2012. (Go to [facility] day following discharge)    Contact information:   [address]  [place], [place] [address]  [facility] [phone]  Valinda Hoar [phone]       Follow up with [facility]. ([facility] will arrange for you to be seen by [facility])    Contact information:   [address] [place] [address] Ph [phone] Valinda Hoar [phone]      Patient denies SI/HI:   Yes,  denies both    Safety Planning and Suicide Prevention discussed:  Yes,  with patient  Clide Dales 10/15/2012, 5:59 PM

## 2012-10-18 ENCOUNTER — Encounter (HOSPITAL_COMMUNITY): Payer: Self-pay | Admitting: *Deleted

## 2012-10-19 ENCOUNTER — Encounter (HOSPITAL_COMMUNITY): Payer: Self-pay | Admitting: Emergency Medicine

## 2012-10-19 ENCOUNTER — Emergency Department (HOSPITAL_COMMUNITY)
Admission: EM | Admit: 2012-10-19 | Discharge: 2012-10-19 | Disposition: A | Discharge status: Home / Self Care | Payer: Medicare Other | Attending: Emergency Medicine | Admitting: Emergency Medicine

## 2012-10-19 DIAGNOSIS — Z79899 Other long term (current) drug therapy: Secondary | ICD-10-CM | POA: Insufficient documentation

## 2012-10-19 DIAGNOSIS — F319 Bipolar disorder, unspecified: Secondary | ICD-10-CM | POA: Insufficient documentation

## 2012-10-19 DIAGNOSIS — M549 Dorsalgia, unspecified: Secondary | ICD-10-CM | POA: Insufficient documentation

## 2012-10-19 DIAGNOSIS — F1010 Alcohol abuse, uncomplicated: Secondary | ICD-10-CM | POA: Insufficient documentation

## 2012-10-19 DIAGNOSIS — F17200 Nicotine dependence, unspecified, uncomplicated: Secondary | ICD-10-CM | POA: Insufficient documentation

## 2012-10-19 DIAGNOSIS — G8929 Other chronic pain: Secondary | ICD-10-CM | POA: Insufficient documentation

## 2012-10-19 DIAGNOSIS — R45851 Suicidal ideations: Secondary | ICD-10-CM | POA: Insufficient documentation

## 2012-10-19 DIAGNOSIS — I456 Pre-excitation syndrome: Secondary | ICD-10-CM | POA: Insufficient documentation

## 2012-10-19 DIAGNOSIS — Z87828 Personal history of other (healed) physical injury and trauma: Secondary | ICD-10-CM | POA: Insufficient documentation

## 2012-10-19 DIAGNOSIS — E119 Type 2 diabetes mellitus without complications: Secondary | ICD-10-CM | POA: Insufficient documentation

## 2012-10-19 DIAGNOSIS — I10 Essential (primary) hypertension: Secondary | ICD-10-CM | POA: Insufficient documentation

## 2012-10-19 DIAGNOSIS — Z9889 Other specified postprocedural states: Secondary | ICD-10-CM | POA: Insufficient documentation

## 2012-10-19 DIAGNOSIS — Z794 Long term (current) use of insulin: Secondary | ICD-10-CM | POA: Insufficient documentation

## 2012-10-19 DIAGNOSIS — Z8619 Personal history of other infectious and parasitic diseases: Secondary | ICD-10-CM | POA: Insufficient documentation

## 2012-10-19 LAB — COMPREHENSIVE METABOLIC PANEL
ALT: 58 U/L — ABNORMAL HIGH (ref 0–53)
AST: 43 U/L — ABNORMAL HIGH (ref 0–37)
Alkaline Phosphatase: 55 U/L (ref 39–117)
CO2: 23 mEq/L (ref 19–32)
Chloride: 105 mEq/L (ref 96–112)
GFR calc Af Amer: >90 mL/min (ref >90)
GFR calc non Af Amer: >90 mL/min (ref >90)
Glucose, Bld: 160 mg/dL — ABNORMAL HIGH (ref 70–99)
Potassium: 3.7 mEq/L (ref 3.5–5.1)
Sodium: 142 mEq/L (ref 135–145)
Total Bilirubin: 0.3 mg/dL (ref 0.3–1.2)

## 2012-10-19 LAB — CBC
Hemoglobin: 14.5 g/dL (ref 13.0–17.0)
MCH: 33 pg (ref 26.0–34.0)
Platelets: 190 10*3/uL (ref 150–400)
RBC: 4.39 MIL/uL (ref 4.22–5.81)
WBC: 8.4 10*3/uL (ref 4.0–10.5)

## 2012-10-19 LAB — GLUCOSE, CAPILLARY

## 2012-10-19 MED ORDER — METHOCARBAMOL 500 MG PO TABS: 500.0000 mg | ORAL_TABLET | Freq: Four times a day (QID) | ORAL | Status: DC | PRN

## 2012-10-19 MED ORDER — OXYCODONE HCL ER 15 MG PO T12A
15.0000 mg | EXTENDED_RELEASE_TABLET | Freq: Two times a day (BID) | ORAL | Status: DC
  Administered 2012-10-19: 15 mg via ORAL
  Filled 2012-10-19: qty 1

## 2012-10-19 MED ORDER — LITHIUM CARBONATE 300 MG PO CAPS: 300.0000 mg | ORAL_CAPSULE | Freq: Three times a day (TID) | ORAL | Status: DC

## 2012-10-19 MED ORDER — NICOTINE 21 MG/24HR TD PT24: 21.0000 mg | MEDICATED_PATCH | Freq: Every day | TRANSDERMAL | Status: DC

## 2012-10-19 MED ORDER — VALACYCLOVIR HCL 500 MG PO TABS
1000.0000 mg | ORAL_TABLET | Freq: Two times a day (BID) | ORAL | Status: DC
  Administered 2012-10-19: 1000 mg via ORAL
  Filled 2012-10-19: qty 2

## 2012-10-19 MED ORDER — TIZANIDINE HCL 4 MG PO TABS
8.0000 mg | ORAL_TABLET | Freq: Three times a day (TID) | ORAL | Status: DC
  Administered 2012-10-19: 8 mg via ORAL
  Filled 2012-10-19: qty 2

## 2012-10-19 MED ORDER — LORAZEPAM 1 MG PO TABS: 1.0000 mg | ORAL_TABLET | Freq: Three times a day (TID) | ORAL | Status: DC | PRN

## 2012-10-19 MED ORDER — INSULIN GLARGINE 100 UNIT/ML ~~LOC~~ SOLN
30.0000 [IU] | Freq: Every day | SUBCUTANEOUS | Status: DC
  Administered 2012-10-19: 30 [IU] via SUBCUTANEOUS
  Filled 2012-10-19: qty 0.3

## 2012-10-19 MED ORDER — ONDANSETRON HCL 4 MG PO TABS: 4.0000 mg | ORAL_TABLET | Freq: Three times a day (TID) | ORAL | Status: DC | PRN

## 2012-10-19 MED ORDER — ACETAMINOPHEN 325 MG PO TABS: 650.0000 mg | ORAL_TABLET | ORAL | Status: DC | PRN

## 2012-10-19 MED ORDER — GABAPENTIN 400 MG PO CAPS
400.0000 mg | ORAL_CAPSULE | Freq: Four times a day (QID) | ORAL | Status: DC
  Administered 2012-10-19: 400 mg via ORAL
  Filled 2012-10-19: qty 1

## 2012-10-19 MED ORDER — ALUM & MAG HYDROXIDE-SIMETH 200-200-20 MG/5ML PO SUSP: 30.0000 mL | ORAL | Status: DC | PRN

## 2012-10-19 MED ORDER — ZOLPIDEM TARTRATE 5 MG PO TABS: 5.0000 mg | ORAL_TABLET | Freq: Every evening | ORAL | Status: DC | PRN

## 2012-10-19 MED ORDER — CITALOPRAM HYDROBROMIDE 40 MG PO TABS: 40.0000 mg | ORAL_TABLET | Freq: Every day | ORAL | Status: DC

## 2012-10-19 NOTE — ED Notes (Signed)
Pt states he is suicidal  Pt states he is sick of hurting and he wil kill himself before he gets the help he needs  Pt states he has had 5 back surgeries in the past and has chronic pain  Pt is intoxicated upon arrival  Speech is slurred  Pt admits to drinking about a half of a fifth today

## 2012-10-19 NOTE — ED Notes (Signed)
Pt presents intoxicated, SI, related to chronic back pain issues.  Denies HI or AV hallucinations.  Pt cooperative at present.
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
## 2012-10-19 NOTE — ED Notes (Signed)
Pt and belongings wanded by security. One pt belongings bag behind nurses station.
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
## 2012-10-20 NOTE — Progress Notes (Signed)
Patient Discharge Instructions:  After Visit Summary (AVS):   Faxed to:  10/20/12 Discharge Summary Note:   Faxed to:  10/20/12 Psychiatric Admission Assessment Note:   Faxed to:  10/20/12 Suicide Risk Assessment - Discharge Assessment:   Faxed to:  10/20/12 Faxed/Sent to the Next [facility] Care provider:  10/20/12 Faxed to [facility] @ [phone] Faxed to [facility] @ [phone]  [name], 10/20/2012, 1:46 PM

## 2012-10-20 NOTE — ED Provider Notes (Signed)
Medical screening examination/treatment/procedure(s) were performed by non-physician practitioner and as supervising physician I was immediately available for consultation/collaboration.    Khori Underberg R Creig Landin, MD 10/20/12 0007 

## 2013-09-12 ENCOUNTER — Encounter (HOSPITAL_COMMUNITY): Payer: Self-pay | Admitting: Emergency Medicine

## 2013-09-12 ENCOUNTER — Emergency Department (HOSPITAL_COMMUNITY)
Admission: EM | Admit: 2013-09-12 | Discharge: 2013-09-12 | Disposition: A | Discharge status: Home / Self Care | Payer: Medicare Other | Attending: Emergency Medicine | Admitting: Emergency Medicine

## 2013-09-12 DIAGNOSIS — E119 Type 2 diabetes mellitus without complications: Secondary | ICD-10-CM | POA: Insufficient documentation

## 2013-09-12 DIAGNOSIS — G8929 Other chronic pain: Secondary | ICD-10-CM | POA: Insufficient documentation

## 2013-09-12 DIAGNOSIS — Z794 Long term (current) use of insulin: Secondary | ICD-10-CM | POA: Insufficient documentation

## 2013-09-12 DIAGNOSIS — Z8619 Personal history of other infectious and parasitic diseases: Secondary | ICD-10-CM | POA: Insufficient documentation

## 2013-09-12 DIAGNOSIS — I10 Essential (primary) hypertension: Secondary | ICD-10-CM | POA: Insufficient documentation

## 2013-09-12 DIAGNOSIS — F17200 Nicotine dependence, unspecified, uncomplicated: Secondary | ICD-10-CM | POA: Insufficient documentation

## 2013-09-12 DIAGNOSIS — F3130 Bipolar disorder, current episode depressed, mild or moderate severity, unspecified: Secondary | ICD-10-CM | POA: Insufficient documentation

## 2013-09-12 DIAGNOSIS — R197 Diarrhea, unspecified: Secondary | ICD-10-CM | POA: Insufficient documentation

## 2013-09-12 DIAGNOSIS — Z79899 Other long term (current) drug therapy: Secondary | ICD-10-CM | POA: Insufficient documentation

## 2013-09-12 LAB — CBC WITH DIFFERENTIAL/PLATELET
Basophils Absolute: 0 10*3/uL (ref 0.0–0.1)
Basophils Relative: 0 % (ref 0–1)
EOS ABS: 0.1 10*3/uL (ref 0.0–0.7)
Eosinophils Relative: 2 % (ref 0–5)
HCT: 42.9 % (ref 39.0–52.0)
Hemoglobin: 14.9 g/dL (ref 13.0–17.0)
Lymphocytes Relative: 31 % (ref 12–46)
Lymphs Abs: 1.9 10*3/uL (ref 0.7–4.0)
MCH: 31 pg (ref 26.0–34.0)
MCHC: 34.7 g/dL (ref 30.0–36.0)
MCV: 89.2 fL (ref 78.0–100.0)
MONO ABS: 0.6 10*3/uL (ref 0.1–1.0)
Monocytes Relative: 10 % (ref 3–12)
Neutro Abs: 3.6 10*3/uL (ref 1.7–7.7)
Neutrophils Relative %: 57 % (ref 43–77)
PLATELETS: 136 10*3/uL — AB (ref 150–400)
RBC: 4.81 MIL/uL (ref 4.22–5.81)
RDW: 13.3 % (ref 11.5–15.5)
WBC: 6.2 10*3/uL (ref 4.0–10.5)

## 2013-09-12 LAB — COMPREHENSIVE METABOLIC PANEL
ALT: 20 U/L (ref 0–53)
AST: 30 U/L (ref 0–37)
Albumin: 3.6 g/dL (ref 3.5–5.2)
Alkaline Phosphatase: 58 U/L (ref 39–117)
BUN: 11 mg/dL (ref 6–23)
CALCIUM: 9.2 mg/dL (ref 8.4–10.5)
CO2: 23 mEq/L (ref 19–32)
Chloride: 102 mEq/L (ref 96–112)
Creatinine, Ser: 0.82 mg/dL (ref 0.50–1.35)
GFR calc Af Amer: >90 mL/min (ref >90)
GFR calc non Af Amer: >90 mL/min (ref >90)
Glucose, Bld: 125 mg/dL — ABNORMAL HIGH (ref 70–99)
Potassium: 4.8 mEq/L (ref 3.7–5.3)
SODIUM: 139 meq/L (ref 137–147)
TOTAL PROTEIN: 7.3 g/dL (ref 6.0–8.3)
Total Bilirubin: 0.6 mg/dL (ref 0.3–1.2)

## 2013-09-12 LAB — URINALYSIS, ROUTINE W REFLEX MICROSCOPIC
BILIRUBIN URINE: NEGATIVE
Glucose, UA: NEGATIVE mg/dL
HGB URINE DIPSTICK: NEGATIVE
Ketones, ur: NEGATIVE mg/dL
Leukocytes, UA: NEGATIVE
Nitrite: NEGATIVE
PH: 6.5 (ref 5.0–8.0)
Protein, ur: NEGATIVE mg/dL
SPECIFIC GRAVITY, URINE: 1.012 (ref 1.005–1.030)
Urobilinogen, UA: 0.2 mg/dL (ref 0.0–1.0)

## 2013-09-12 MED ORDER — CITALOPRAM HYDROBROMIDE 40 MG PO TABS: 40.0000 mg | ORAL_TABLET | Freq: Every day | ORAL | Status: DC

## 2013-09-12 MED ORDER — TRAZODONE HCL 100 MG PO TABS: 300.0000 mg | ORAL_TABLET | Freq: Every day | ORAL | Status: DC

## 2013-09-12 MED ORDER — OMEPRAZOLE 20 MG PO CPDR: 20.0000 mg | DELAYED_RELEASE_CAPSULE | Freq: Every day | ORAL | Status: DC

## 2013-09-12 MED ORDER — SODIUM CHLORIDE 0.9 % IV BOLUS (SEPSIS)
1000.0000 mL | Freq: Once | INTRAVENOUS | Status: AC
  Administered 2013-09-12: 1000 mL via INTRAVENOUS

## 2013-09-12 MED ORDER — GABAPENTIN 400 MG PO CAPS: 400.0000 mg | ORAL_CAPSULE | Freq: Three times a day (TID) | ORAL | Status: DC

## 2013-09-12 NOTE — ED Notes (Addendum)
Ambulatory to [facility] requested and received Mal[place] sandwich  And diet sprite dr Denton Lanksteinl into reeval

## 2013-09-12 NOTE — ED Provider Notes (Signed)
Angiocath insertion Performed by: Loma BostonStirling Larie Mathes  Consent: Verbal consent obtained. Risks and benefits: risks, benefits and alternatives were discussed Time out: Immediately prior to procedure a "time out" was called to verify the correct patient, procedure, equipment, support staff and site/side marked as required.  Preparation: Patient was prepped and draped in the usual sterile fashion.  Vein Location: L AC  Ultrasound Guided  Gauge: 20 gauge.  Normal blood return and flush without difficulty Patient tolerance: Patient tolerated the procedure well with no immediate complications.  I have discussed case and care has been guided by my attending physician, Denton LankSteinl.     Loma BostonStirling Onesimo Lingard, MD 09/12/13 [phone]
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
## 2013-09-12 NOTE — ED Notes (Signed)
Per pt sts he has been having watery diarrhea since Thursday. sts unable to eat or drink anything. sts also some vomiting.

## 2013-09-12 NOTE — ED Notes (Signed)
States just out of prison and that he has had n/v/d since Thursday states is diabetic and they did not give all the stuff he needed to test his sugar so he has not tested it in a awhile

## 2013-09-12 NOTE — Discharge Instructions (Signed)
Rest. Drink plenty of fluids. You may take imodium as need if diarrhea. Follow up with primary care doctor in coming week if symptoms fail to improve/resolve. Return to ER if worse, new symptoms, fevers, persistent vomiting, other concern.   Diarrhea Diarrhea is frequent loose and watery bowel movements. It can cause you to feel weak and dehydrated. Dehydration can cause you to become tired and thirsty, have a dry mouth, and have decreased urination that often is dark yellow. Diarrhea is a sign of another problem, most often an infection that will not last long. In most cases, diarrhea typically lasts 2 3 days. However, it can last longer if it is a sign of something more serious. It is important to treat your diarrhea as directed by your caregive to lessen or prevent future episodes of diarrhea. CAUSES  Some common causes include:  Gastrointestinal infections caused by viruses, bacteria, or parasites.  Food poisoning or food allergies.  Certain medicines, such as antibiotics, chemotherapy, and laxatives.  Artificial sweeteners and fructose.  Digestive disorders. HOME CARE INSTRUCTIONS  Ensure adequate fluid intake (hydration): have 1 cup (8 oz) of fluid for each diarrhea episode. Avoid fluids that contain simple sugars or sports drinks, fruit juices, whole milk products, and sodas. Your urine should be clear or pale yellow if you are drinking enough fluids. Hydrate with an oral rehydration solution that you can purchase at pharmacies, retail stores, and online. You can prepare an oral rehydration solution at home by mixing the following ingredients together:    tsp table salt.   tsp baking soda.   tsp salt substitute containing potassium chloride.  1  tablespoons sugar.  1 L (34 oz) of water.  Certain foods and beverages may increase the speed at which food moves through the gastrointestinal (GI) tract. These foods and beverages should be avoided and include:  Caffeinated and  alcoholic beverages.  High-fiber foods, such as raw fruits and vegetables, nuts, seeds, and whole grain breads and cereals.  Foods and beverages sweetened with sugar alcohols, such as xylitol, sorbitol, and mannitol.  Some foods may be well tolerated and may help thicken stool including:  Starchy foods, such as rice, toast, pasta, low-sugar cereal, oatmeal, grits, baked potatoes, crackers, and bagels.  Bananas.  Applesauce.  Add probiotic-rich foods to help increase healthy bacteria in the GI tract, such as yogurt and fermented milk products.  Wash your hands well after each diarrhea episode.  Only take over-the-counter or prescription medicines as directed by your caregiver.  Take a warm bath to relieve any burning or pain from frequent diarrhea episodes. SEEK IMMEDIATE MEDICAL CARE IF:   You are unable to keep fluids down.  You have persistent vomiting.  You have blood in your stool, or your stools are black and tarry.  You do not urinate in 6 8 hours, or there is only a small amount of very dark urine.  You have abdominal pain that increases or localizes.  You have weakness, dizziness, confusion, or lightheadedness.  You have a severe headache.  Your diarrhea gets worse or does not get better.  You have a fever or persistent symptoms for more than 2 3 days.  You have a fever and your symptoms suddenly get worse. MAKE SURE YOU:   Understand these instructions.  Will watch your condition.  Will get help right away if you are not doing well or get worse. Document Released: 05/09/2002 Document Revised: 05/05/2012 Document Reviewed: 01/25/2012 [facility] Patient Information 2014 [place], [place].  Emergency Department Resource Guide 1) Find a Doctor and Pay Out of Pocket Although you won't have to find out who is covered by your insurance plan, it is a good idea to ask around and get recommendations. You will then need to call the office and see if the doctor  you have chosen will accept you as a new patient and what types of options they offer for patients who are self-pay. Some doctors offer discounts or will set up payment plans for their patients who do not have insurance, but you will need to ask so you aren't surprised when you get to your appointment.  2) Contact Your Local Health Department Not all health departments have doctors that can see patients for sick visits, but many do, so it is worth a call to see if yours does. If you don't know where your local health department is, you can check in your phone book. The CDC also has a tool to help you locate your state's health department, and many state websites also have listings of all of their local health departments.  3) Find a Walk-in Clinic If your illness is not likely to be very severe or complicated, you may want to try a walk in clinic. These are popping up all over the country in pharmacies, drugstores, and shopping centers. They're usually staffed by nurse practitioners or physician assistants that have been trained to treat common illnesses and complaints. They're usually fairly quick and inexpensive. However, if you have serious medical issues or chronic medical problems, these are probably not your best option.  No Primary Care Doctor: - Call Health Connect at  [phone] - they can help you locate a primary care doctor that  accepts your insurance, provides certain services, etc. - Physician Referral Service- [phone]  Chronic Pain Problems: Organization         Address  Phone   Notes  [facility]  [phone] Patients need to be referred by their primary care doctor.   Medication Assistance: Organization         Address  Phone   Notes  [facility] [address] [place], [place] [address] [phone] --Must be a resident of [place] -- Must have NO insurance coverage whatsoever (no Medicaid/  Medicare, etc.) -- The pt. MUST have a primary care doctor that directs their care regularly and follows them in the community   [facility]ist  [phone]   [employer]  [phone]    Agencies that provide inexpensive medical care: Organization         Address  Phone   Notes  [facility]  [phone]) [phone]   [facility]    [phone]   [facility] [address] [place], [place] [address] [phone]   [facility] [address], [place] [phone]) [phone]   [facility]anned Parenthood    [phone]   [facility]    [phone]   [facility]  [address], [place] Phone:  [phone]) [phone], Fax:  [phone] Hours of Operation:  9 am - 6 pm, M-F.  Also accepts Medicaid/Medicare and self-pay.  [facility]  [address], [place] Phone: [phone]) [phone], Fax: [phone]. Hours of Operation:  8:30 am - 5:30 pm, M-F.  Also accepts Medicaid and self-pay.  [facility] [place]  [address], [employer] Phone: [phone]   [facility] [address] Natasha Bence [place], [place] [phone], Ext. [phone] Mondays & Thursdays: 7-9 AM.  First 15 patients are seen on a first come, first serve basis.    Medicaid-accepting [facility] Providers:  Organization         Address  Phone   Notes  [facility] [place] [facility] [address], [place] [phone] Also accepts self-pay patients.  [facility] [address] Laurell Josephs [place], [place]  [phone]   [facility] [address], [place] [phone]   [facility] [address], [place] [phone]   Renaye Rakers [address], [place]   [phone] Only accepts [place] patients after they have their name applied to their card.    Self-Pay (no insurance) in [facility]:  Organization         Address  Phone   Notes  Sickle Cell Patients, [facility] [address] [place], [place] [phone]   [facility] [address] [place], [place] [phone]   [facility]  [address], [place] [phone]   [facility]/Dr. Osei-Bonsu  [address], [place] or [address], [place] [phone] Phone number for both [place] and [place] locations is the same.  [facility] [address], [place] [phone]   [facility] [address] [place] or [address] [phone]   [facility] [address], [place] [phone], phone; [phone], fax Sees patients 1st and 3rd Saturday of every month.  Must not qualify for public or private insurance (i.e. Medicaid, Medicare, [place] Health Choice, Veterans' Benefits)  Household income should be no more than 200% of the poverty level The clinic cannot treat you if you are pregnant or think you are pregnant  Sexually transmitted diseases are not treated at the clinic.    Dental Care: Organization         Address  Phone  Notes  [facility] Department of [facility] [address] [place], [place] [phone] Accepts children up to age 15 who are enrolled in [place] or [place] Health Choice; pregnant women with a Medicaid card; and children who have applied for Medicaid or [place] Health Choice, but were declined, whose parents can pay a reduced fee at time of service.  [facility] Department of [facility]  [address], [place] [phone] Accepts children up to age 93 who are enrolled in [place] or [place] Health Choice; pregnant women with a Medicaid card; and children who have applied for Medicaid or [place] Health Choice,  but were declined, whose parents can pay a reduced fee at time of service.  [facility]  [address] [place], [place] [phone] Patients are seen by appointment only. Walk-ins are not accepted. [facility] will see patients 49 years of age and older. Monday - Tuesday (8am-5pm) Most Wednesdays (8:30-5pm) $30 per visit, cash only  [facility]  [address], [facility] [phone] Patients are seen by appointment only. Walk-ins are not accepted. [facility] will see patients 47 years of age and older.  One Wednesday Evening (Monthly: Volunteer Based).  $30 per visit, cash only  [employer]  [phone] for adults; Children under age 72, call Graduate Pediatric Dentistry at [phone]. Children aged 12-14, please call [phone] to request a pediatric application.  Dental services are provided in all areas of dental care including fillings, crowns and bridges, complete and partial dentures, implants, gum treatment, root canals, and extractions. Preventive care is also provided. Treatment is provided to both adults and children. Patients are selected via a lottery and there is often a waiting list.   [facility] [address], [place]  [phone] [email or web address]   [facility] [address] [place], [place] [phone], Ext. [phone] Second and Fourth Thursday of each month, opens at 6:30 AM; Clinic ends at 9 AM.  Patients are seen on a first-come first-served basis, and a limited number are seen during each clinic.   [facility]  [address] Ether Griffins [place], [place] [phone]   Eligibility Requirements You must have lived in [place], [place], or [place] counties for at least the last three months.   You cannot be eligible for state or federal sponsored [place], including [employer], [place], or [employer].   You generally  cannot be eligible for healthcare insurance through your employer.    How to apply: Eligibility screenings are held every Tuesday and Wednesday afternoon from 1:00 pm until 4:00 pm. You do not need an appointment for the interview!  [facility] [address], [place], [place] [phone]   [facility]  [phone]   [facility] Health Department  [phone]   [facility] Health Department  [phone]    Behavioral Health Resources in the Community: Intensive Outpatient Programs Organization         Address  Phone  Notes  [facility] [address], [place], [place] [phone]   [facility] Outpatient [address], [place], [place] [phone]   [facility] [address], [place], [place]  [phone]   [facility] [address],  [place], [place] [phone] or [phone]   Substance Abuse Resources Organization         Address  Phone  Notes  [facility]  [phone]   [facility]  [phone]   [facility]  [phone]   Floydene Flock  [phone]   Residential & Outpatient Substance Abuse Program  [phone]   Psychological Services Organization         Address  Phone  Notes  [facility] Behavioral Health  [phone]   [facility]  [phone]   [facility] [address] [place] [phone] or [phone]    Mobile Crisis Teams Organization         Address  Phone  Notes  [facility], Mobile Crisis Care Unit  [phone]   [facility]  [address] [place], [place] [phone]   Doristine Locks [address] [place] [phone]    Self-Help/Support Groups Organization         Address  Phone             Notes  [facility] [place] - variety of support groups  [phone]- I7437963 Call for more  information  Narcotics Anonymous (NA), [facility] [address], [employer] [place]  2 meetings at this  location   Residential Treatment Programs Organization         Address  Phone  Notes  [facility] [address],    [place]  [phone]   [facility]  [address], [place] [phone], [place], [place] [phone]   [facility] [address], [place] [phone] Admissions: 8am-3pm M-F  [facility] [address]    [place], [place] [phone]   [facility] [address], [place], [place] [phone]   [facility] [address]  [place], [place] [phone]   [facility] - Intensive Outpatient [address]ure[address] Josephs[address], [place], [place] [phone]   [facility] ([facility] [address]  [place], [place] [phone] or [phone]   [facility] ([facility]) [address] [place], [place] [phone] Accepts Medicaid  [facility] [address]  [place] [phone] Substance Abuse/Addiction Treatment   [facility] Behavioral Health Resources Organization         Address  Phone  Notes  [facility]  [phone]) [phone]   Angie FavaJulie Brannon, PhD [address] [place], [place]   [phone] or [phone]) [phone]   [facility]   [address] [place], [place] [phone]) [phone]   [facility] [address], [place], [place] [phone]) [phone] Insurance/Medicaid/sponsorship through [facility]  [facility] [address]                                    [place], [place] [phone]) [phone] Therapy/tele-psych/case  [facility] [address].   [place], [place] [phone]) [phone]    Dr. Lolly MustacheArfeen  [phone](336) [phone]   [facility]  [facility] 1) [address], [place] 2) [address], [place] 3)  [address], [place] [phone])  [phone]) [phone]  [phone]) [phone]   [facility] Child Abuse Hotline [phone]) [phone] or [phone](336) [phone] (After Hours)

## 2013-09-12 NOTE — ED Notes (Signed)
Pt offered a cup of ice water; pt aware of need of urine specimen; pt using the urinal at this time to attempt to provide an urine sample

## 2013-09-12 NOTE — Progress Notes (Signed)
CSW spoke with pt in regards to needing resources for medication and mental health follow up. CM will assist pt with medication assistance and PCP. CSW gave pt resource for behavioral health, [facility]. CSW explained the appointment process and pt stated he will walk in for an appointment.    [address] Drive[address]is Rasean [place]os, [place] [phone]

## 2013-09-12 NOTE — ED Notes (Signed)
Pt given rx for meds he was out of and directions to health and wellness to get app for tomoeeow

## 2013-09-13 NOTE — ED Provider Notes (Signed)
I saw and evaluated the patient, reviewed the resident's note and I agree with the findings and plan.   EKG Interpretation None      Present and supervised key portions of procedure.  See H and P note.     Suzi RootsKevin E Matthias Bogus, MD 09/13/13 [phone]

## 2013-09-20 ENCOUNTER — Encounter (HOSPITAL_COMMUNITY): Payer: Self-pay | Admitting: Emergency Medicine

## 2013-09-20 ENCOUNTER — Emergency Department (HOSPITAL_COMMUNITY)
Admission: EM | Admit: 2013-09-20 | Discharge: 2013-09-20 | Disposition: A | Discharge status: Home / Self Care | Payer: Medicare Other | Attending: Emergency Medicine | Admitting: Emergency Medicine

## 2013-09-20 DIAGNOSIS — F17200 Nicotine dependence, unspecified, uncomplicated: Secondary | ICD-10-CM | POA: Insufficient documentation

## 2013-09-20 DIAGNOSIS — F319 Bipolar disorder, unspecified: Secondary | ICD-10-CM | POA: Insufficient documentation

## 2013-09-20 DIAGNOSIS — Z79899 Other long term (current) drug therapy: Secondary | ICD-10-CM | POA: Insufficient documentation

## 2013-09-20 DIAGNOSIS — I10 Essential (primary) hypertension: Secondary | ICD-10-CM | POA: Insufficient documentation

## 2013-09-20 DIAGNOSIS — Z8619 Personal history of other infectious and parasitic diseases: Secondary | ICD-10-CM | POA: Insufficient documentation

## 2013-09-20 DIAGNOSIS — Z794 Long term (current) use of insulin: Secondary | ICD-10-CM | POA: Insufficient documentation

## 2013-09-20 DIAGNOSIS — R197 Diarrhea, unspecified: Secondary | ICD-10-CM | POA: Insufficient documentation

## 2013-09-20 DIAGNOSIS — E119 Type 2 diabetes mellitus without complications: Secondary | ICD-10-CM | POA: Insufficient documentation

## 2013-09-20 DIAGNOSIS — R112 Nausea with vomiting, unspecified: Secondary | ICD-10-CM | POA: Insufficient documentation

## 2013-09-20 DIAGNOSIS — G8929 Other chronic pain: Secondary | ICD-10-CM | POA: Insufficient documentation

## 2013-09-20 LAB — URINALYSIS, ROUTINE W REFLEX MICROSCOPIC
GLUCOSE, UA: NEGATIVE mg/dL
Hgb urine dipstick: NEGATIVE
KETONES UR: NEGATIVE mg/dL
Leukocytes, UA: NEGATIVE
NITRITE: NEGATIVE
PH: 5.5 (ref 5.0–8.0)
Protein, ur: NEGATIVE mg/dL
SPECIFIC GRAVITY, URINE: 1.026 (ref 1.005–1.030)
Urobilinogen, UA: 1 mg/dL (ref 0.0–1.0)

## 2013-09-20 LAB — CBC WITH DIFFERENTIAL/PLATELET
Basophils Absolute: 0 10*3/uL (ref 0.0–0.1)
Basophils Relative: 0 % (ref 0–1)
EOS PCT: 8 % — AB (ref 0–5)
Eosinophils Absolute: 0.5 10*3/uL (ref 0.0–0.7)
HCT: 45.6 % (ref 39.0–52.0)
Hemoglobin: 15.9 g/dL (ref 13.0–17.0)
LYMPHS ABS: 1.9 10*3/uL (ref 0.7–4.0)
Lymphocytes Relative: 28 % (ref 12–46)
MCH: 30.6 pg (ref 26.0–34.0)
MCHC: 34.9 g/dL (ref 30.0–36.0)
MCV: 87.9 fL (ref 78.0–100.0)
MONOS PCT: 8 % (ref 3–12)
Monocytes Absolute: 0.5 10*3/uL (ref 0.1–1.0)
Neutro Abs: 3.7 10*3/uL (ref 1.7–7.7)
Neutrophils Relative %: 56 % (ref 43–77)
Platelets: 111 10*3/uL — ABNORMAL LOW (ref 150–400)
RBC: 5.19 MIL/uL (ref 4.22–5.81)
RDW: 13 % (ref 11.5–15.5)
WBC: 6.6 10*3/uL (ref 4.0–10.5)

## 2013-09-20 LAB — LIPASE, BLOOD: LIPASE: 21 U/L (ref 11–59)

## 2013-09-20 LAB — COMPREHENSIVE METABOLIC PANEL
ALT: 45 U/L (ref 0–53)
AST: 29 U/L (ref 0–37)
Albumin: 3.9 g/dL (ref 3.5–5.2)
Alkaline Phosphatase: 69 U/L (ref 39–117)
BILIRUBIN TOTAL: 0.7 mg/dL (ref 0.3–1.2)
BUN: 13 mg/dL (ref 6–23)
CHLORIDE: 102 meq/L (ref 96–112)
CO2: 21 mEq/L (ref 19–32)
Calcium: 9.1 mg/dL (ref 8.4–10.5)
Creatinine, Ser: 0.95 mg/dL (ref 0.50–1.35)
GFR calc Af Amer: >90 mL/min (ref >90)
GFR calc non Af Amer: >90 mL/min (ref >90)
Glucose, Bld: 204 mg/dL — ABNORMAL HIGH (ref 70–99)
Potassium: 4.3 mEq/L (ref 3.7–5.3)
SODIUM: 135 meq/L — AB (ref 137–147)
Total Protein: 7.3 g/dL (ref 6.0–8.3)

## 2013-09-20 LAB — CBG MONITORING, ED: Glucose-Capillary: 179 mg/dL — ABNORMAL HIGH (ref 70–99)

## 2013-09-20 NOTE — Discharge Instructions (Signed)
Rest. Drink plenty of fluids. You may try imodium as need if diarrhea. Follow up with primary care doctor in coming week. Return to ER if worse, new symptoms, fevers, other concern.   For your mental health follow up, follow up with [facility] in the next couple days.    Nausea and Vomiting Nausea is a sick feeling that often comes before throwing up (vomiting). Vomiting is a reflex where stomach contents come out of your mouth. Vomiting can cause severe loss of body fluids (dehydration). Children and elderly adults can become dehydrated quickly, especially if they also have diarrhea. Nausea and vomiting are symptoms of a condition or disease. It is important to find the cause of your symptoms. CAUSES   Direct irritation of the stomach lining. This irritation can result from increased acid production (gastroesophageal reflux disease), infection, food poisoning, taking certain medicines (such as nonsteroidal anti-inflammatory drugs), alcohol use, or tobacco use.  Signals from the brain.These signals could be caused by a headache, heat exposure, an inner ear disturbance, increased pressure in the brain from injury, infection, a tumor, or a concussion, pain, emotional stimulus, or metabolic problems.  An obstruction in the gastrointestinal tract (bowel obstruction).  Illnesses such as diabetes, hepatitis, gallbladder problems, appendicitis, kidney problems, cancer, sepsis, atypical symptoms of a heart attack, or eating disorders.  Medical treatments such as chemotherapy and radiation.  Receiving medicine that makes you sleep (general anesthetic) during surgery. DIAGNOSIS Your caregiver may ask for tests to be done if the problems do not improve after a few days. Tests may also be done if symptoms are severe or if the reason for the nausea and vomiting is not clear. Tests may include:  Urine tests.  Blood tests.  Stool tests.  Cultures (to look for evidence of infection).  X-rays or  other imaging studies. Test results can help your caregiver make decisions about treatment or the need for additional tests. TREATMENT You need to stay well hydrated. Drink frequently but in small amounts.You may wish to drink water, sports drinks, clear broth, or eat frozen ice pops or gelatin dessert to help stay hydrated.When you eat, eating slowly may help prevent nausea.There are also some antinausea medicines that may help prevent nausea. HOME CARE INSTRUCTIONS   Take all medicine as directed by your caregiver.  If you do not have an appetite, do not force yourself to eat. However, you must continue to drink fluids.  If you have an appetite, eat a normal diet unless your caregiver tells you differently.  Eat a variety of complex carbohydrates (rice, wheat, potatoes, bread), lean meats, yogurt, fruits, and vegetables.  Avoid high-fat foods because they are more difficult to digest.  Drink enough water and fluids to keep your urine clear or pale yellow.  If you are dehydrated, ask your caregiver for specific rehydration instructions. Signs of dehydration may include:  Severe thirst.  Dry lips and mouth.  Dizziness.  Dark urine.  Decreasing urine frequency and amount.  Confusion.  Rapid breathing or pulse. SEEK IMMEDIATE MEDICAL CARE IF:   You have blood or brown flecks (like coffee grounds) in your vomit.  You have black or bloody stools.  You have a severe headache or stiff neck.  You are confused.  You have severe abdominal pain.  You have chest pain or trouble breathing.  You do not urinate at least once every 8 hours.  You develop cold or clammy skin.  You continue to vomit for longer than 24 to 48 hours.  You have a fever. MAKE SURE YOU:   Understand these instructions.  Will watch your condition.  Will get help right away if you are not doing well or get worse. Document Released: 05/19/2005 Document Revised: 08/11/2011 Document Reviewed:  10/16/2010 [facility] Patient Information 2014 [place], [place].   Diarrhea Diarrhea is frequent loose and watery bowel movements. It can cause you to feel weak and dehydrated. Dehydration can cause you to become tired and thirsty, have a dry mouth, and have decreased urination that often is dark yellow. Diarrhea is a sign of another problem, most often an infection that will not last long. In most cases, diarrhea typically lasts 2 3 days. However, it can last longer if it is a sign of something more serious. It is important to treat your diarrhea as directed by your caregive to lessen or prevent future episodes of diarrhea. CAUSES  Some common causes include:  Gastrointestinal infections caused by viruses, bacteria, or parasites.  Food poisoning or food allergies.  Certain medicines, such as antibiotics, chemotherapy, and laxatives.  Artificial sweeteners and fructose.  Digestive disorders. HOME CARE INSTRUCTIONS  Ensure adequate fluid intake (hydration): have 1 cup (8 oz) of fluid for each diarrhea episode. Avoid fluids that contain simple sugars or sports drinks, fruit juices, whole milk products, and sodas. Your urine should be clear or pale yellow if you are drinking enough fluids. Hydrate with an oral rehydration solution that you can purchase at pharmacies, retail stores, and online. You can prepare an oral rehydration solution at home by mixing the following ingredients together:    tsp table salt.   tsp baking soda.   tsp salt substitute containing potassium chloride.  1  tablespoons sugar.  1 L (34 oz) of water.  Certain foods and beverages may increase the speed at which food moves through the gastrointestinal (GI) tract. These foods and beverages should be avoided and include:  Caffeinated and alcoholic beverages.  High-fiber foods, such as raw fruits and vegetables, nuts, seeds, and whole grain breads and cereals.  Foods and beverages sweetened with sugar alcohols,  such as xylitol, sorbitol, and mannitol.  Some foods may be well tolerated and may help thicken stool including:  Starchy foods, such as rice, toast, pasta, low-sugar cereal, oatmeal, grits, baked potatoes, crackers, and bagels.  Bananas.  Applesauce.  Add probiotic-rich foods to help increase healthy bacteria in the GI tract, such as yogurt and fermented milk products.  Wash your hands well after each diarrhea episode.  Only take over-the-counter or prescription medicines as directed by your caregiver.  Take a warm bath to relieve any burning or pain from frequent diarrhea episodes. SEEK IMMEDIATE MEDICAL CARE IF:   You are unable to keep fluids down.  You have persistent vomiting.  You have blood in your stool, or your stools are black and tarry.  You do not urinate in 6 8 hours, or there is only a small amount of very dark urine.  You have abdominal pain that increases or localizes.  You have weakness, dizziness, confusion, or lightheadedness.  You have a severe headache.  Your diarrhea gets worse or does not get better.  You have a fever or persistent symptoms for more than 2 3 days.  You have a fever and your symptoms suddenly get worse. MAKE SURE YOU:   Understand these instructions.  Will watch your condition.  Will get help right away if you are not doing well or get worse. Document Released: 05/09/2002 Document Revised: 05/05/2012 Document Reviewed:  01/25/2012 [employer] Patient Information 2014 [place], [place].

## 2013-09-20 NOTE — Progress Notes (Signed)
  CARE MANAGEMENT ED NOTE 09/20/2013  Patient:  Billy [name],Billy [name]   Account Number:  [id number]  Date Initiated:  09/20/2013  Documentation initiated by:  Edd ArbourGIBBS,KIMBERLY  Subjective/Objective Assessment:   53 yr old medicare/medicaid covered pt without a pcp (as confirmed by pt) c/o n/v/d since April 9. [facility]. Pt states anything he eats or drinks comes back up. Pt brought half full grape Fanta     Subjective/Objective Assessment Detail:   in triage     Action/Plan:   CM provided pt with a list of medicaid [place] pcps, dentists and a list of medicare providers within zip [phone]   Action/Plan Detail:   Anticipated DC Date:  09/20/2013     Status Recommendation to Physician:   Result of Recommendation:    Other ED Services  Consult Working Plan    DC Planning Services  Other  PCP issues  Outpatient Services - Pt will follow up    Choice offered to / List presented to:            Status of service:  Completed, signed off  ED Comments:   ED Comments Detail:

## 2013-09-20 NOTE — ED Notes (Signed)
ED CBG 179

## 2013-09-20 NOTE — ED Notes (Addendum)
Pt was refusing to be discharged. Pt requesting to see md again,  md made aware and reported he had already talked with pt. Charge went in to discharge pt, charge went over discharge papers. Pt refused to sign. Pt got dressed and walked out.

## 2013-09-20 NOTE — ED Provider Notes (Signed)
CSN: [id number]     Arrival date & time 09/20/13  1246 History   First MD Initiated Contact with Patient 09/20/13 1333     Chief Complaint  Patient presents with  . Nausea  . Emesis     (Consider location/radiation/quality/duration/timing/severity/associated sxs/prior Treatment) Patient is a 53 y.o. male presenting with vomiting. The history is provided by the patient.  Emesis Associated symptoms: diarrhea   Associated symptoms: no abdominal pain, no chills, no headaches and no sore throat   pt c/o intermittent nvd in past 1-2 weeks. States hx diabetes, htn, hep c. Emesis clear to color of recently ingested fluids. No bloody or bilious emesis. Diarrhea watery, not bloody. No abd pain. No distension. No fever or chills. No dysuria or gu c/o. ?hx gerd. No prior abd surgery. States recently released from jail, no current pcp, [facility]. Denies wt loss, no faintness/syncope.      Past Medical History  Diagnosis Date  . Diabetes mellitus   . Hepatitis C carrier   . WPW (Wolff-Parkinson-White syndrome)   . Depression   . Chronic pain   . Suicide attempt   . Diabetes mellitus without complication   . Hypertension   . Bipolar 1 disorder   . HCV (hepatitis C virus)    Past Surgical History  Procedure Laterality Date  . Rhinoplasty    . Chest surgery    . Back surgery    . Tonsillectomy     No family history on file. History  Substance Use Topics  . Smoking status: Current Every Day Smoker -- 0.25 packs/day for 8 years    Types: Cigarettes  . Smokeless tobacco: Not on file  . Alcohol Use: Yes     Comment: heavy    Review of Systems  Constitutional: Negative for fever and chills.  HENT: Negative for sore throat.   Eyes: Negative for redness.  Respiratory: Negative for cough and shortness of breath.   Cardiovascular: Negative for chest pain and leg swelling.  Gastrointestinal: Positive for vomiting and diarrhea. Negative for  abdominal pain and blood in stool.  Genitourinary: Negative for dysuria and flank pain.  Musculoskeletal: Negative for back pain and neck pain.  Skin: Negative for rash.  Neurological: Negative for headaches.  Hematological: Does not bruise/bleed easily.  Psychiatric/Behavioral: Negative for confusion.      Allergies  Review of patient's allergies indicates no known allergies.  Home Medications   Prior to Admission medications   Medication Sig Start Date End Date Taking? Authorizing Provider  citalopram (CELEXA) 40 MG tablet Take 1 tablet (40 mg total) by mouth daily. 09/12/13  Yes Suzi RootsKevin E Kella Splinter, MD  gabapentin (NEURONTIN) 400 MG capsule Take 1 capsule (400 mg total) by mouth 3 (three) times daily. 09/12/13  Yes Suzi RootsKevin E Terrina Docter, MD  insulin glargine (LANTUS) 100 UNIT/ML injection Inject 30-40 Units into the skin at bedtime. Takes 40 units of lantus if blood sugar over 200   Yes Historical Provider, MD  lithium carbonate 300 MG capsule Take 1 capsule (300 mg total) by mouth 3 (three) times daily with meals. For mood stabilization 10/15/12  Yes Sanjuana KavaAgnes I Nwoko, NP  omeprazole (PRILOSEC) 20 MG capsule Take 1 capsule (20 mg total) by mouth daily. 09/12/13  Yes Suzi RootsKevin E Keiran Sias, MD  traZODone (DESYREL) 100 MG tablet Take 3 tablets (300 mg total) by mouth at bedtime. 09/12/13  Yes Suzi RootsKevin E Nathanie Ottley, MD  valACYclovir (VALTREX) 1000 MG tablet Take 1,000 mg by mouth  2 (two) times daily.   Yes Historical Provider, MD   BP 136/97  Pulse 92  Temp(Src) 98.1 F (36.7 C) (Oral)  Resp 18  SpO2 98% Physical Exam  Nursing note and vitals reviewed. Constitutional: He is oriented to person, place, and time. He appears well-developed and well-nourished. No distress.  HENT:  Head: Atraumatic.  Mouth/Throat: Oropharynx is clear and moist.  Eyes: Conjunctivae are normal. No scleral icterus.  Neck: Neck supple. No tracheal deviation present.  Cardiovascular: Normal rate, regular rhythm, normal heart sounds and  intact distal pulses.   Pulmonary/Chest: Effort normal and breath sounds normal. No accessory muscle usage. No respiratory distress.  Abdominal: Soft. Bowel sounds are normal. He exhibits no distension and no mass. There is no tenderness. There is no rebound and no guarding.  Genitourinary:  No cva tenderness  Musculoskeletal: Normal range of motion. He exhibits no edema and no tenderness.  Neurological: He is alert and oriented to person, place, and time.  Skin: Skin is warm and dry. No rash noted.  Psychiatric: He has a normal mood and affect.    ED Course  Procedures (including critical care time)   Results for orders placed during the hospital encounter of 09/20/13  COMPREHENSIVE METABOLIC PANEL      Result Value Ref Range   Sodium 135 (*) 137 - 147 mEq/L   Potassium 4.3  3.7 - 5.3 mEq/L   Chloride 102  96 - 112 mEq/L   CO2 21  19 - 32 mEq/L   Glucose, Bld 204 (*) 70 - 99 mg/dL   BUN 13  6 - 23 mg/dL   Creatinine, Ser 8.110.95  0.50 - 1.35 mg/dL   Calcium 9.1  8.4 - 91.410.5 mg/dL   Total Protein 7.3  6.0 - 8.3 g/dL   Albumin 3.9  3.5 - 5.2 g/dL   AST 29  0 - 37 U/L   ALT 45  0 - 53 U/L   Alkaline Phosphatase 69  39 - 117 U/L   Total Bilirubin 0.7  0.3 - 1.2 mg/dL   GFR calc non Af Amer >90  >90 mL/min   GFR calc Af Amer >90  >90 mL/min  LIPASE, BLOOD      Result Value Ref Range   Lipase 21  11 - 59 U/L  URINALYSIS, ROUTINE W REFLEX MICROSCOPIC      Result Value Ref Range   Color, Urine AMBER (*) YELLOW   APPearance CLEAR  CLEAR   Specific Gravity, Urine 1.026  1.005 - 1.030   pH 5.5  5.0 - 8.0   Glucose, UA NEGATIVE  NEGATIVE mg/dL   Hgb urine dipstick NEGATIVE  NEGATIVE   Bilirubin Urine SMALL (*) NEGATIVE   Ketones, ur NEGATIVE  NEGATIVE mg/dL   Protein, ur NEGATIVE  NEGATIVE mg/dL   Urobilinogen, UA 1.0  0.0 - 1.0 mg/dL   Nitrite NEGATIVE  NEGATIVE   Leukocytes, UA NEGATIVE  NEGATIVE  CBC WITH DIFFERENTIAL      Result Value Ref Range   WBC 6.6  4.0 - 10.5 K/uL    RBC 5.19  4.22 - 5.81 MIL/uL   Hemoglobin 15.9  13.0 - 17.0 g/dL   HCT 78.245.6  95.639.0 - 21.352.0 %   MCV 87.9  78.0 - 100.0 fL   MCH 30.6  26.0 - 34.0 pg   MCHC 34.9  30.0 - 36.0 g/dL   RDW 08.613.0  57.811.5 - 46.915.5 %   Platelets PENDING  150 - 400 K/uL  Neutrophils Relative % 56  43 - 77 %   Neutro Abs 3.7  1.7 - 7.7 K/uL   Lymphocytes Relative 28  12 - 46 %   Lymphs Abs 1.9  0.7 - 4.0 K/uL   Monocytes Relative 8  3 - 12 %   Monocytes Absolute 0.5  0.1 - 1.0 K/uL   Eosinophils Relative 8 (*) 0 - 5 %   Eosinophils Absolute 0.5  0.0 - 0.7 K/uL   Basophils Relative 0  0 - 1 %   Basophils Absolute 0.0  0.0 - 0.1 K/uL  CBG MONITORING, ED      Result Value Ref Range   Glucose-Capillary 179 (*) 70 - 99 mg/dL      MDM  Labs.  Reviewed nursing notes and prior charts for additional history.   Pt drinking grape soda, w no emesis.   No diarrhea during period observation in ED.  abd soft nt.  Has been referred to [facility] on recent ED visit, and first visit there has been scheduled, and pt has received his meds there already.  No nvd while in ED. Pt appears stable for d/c.      Suzi Roots, MD 09/20/13 [phone]

## 2013-09-20 NOTE — ED Notes (Signed)
Pt c/o n/v/d since April 9. [facility] 13. Pt states anything he eats or drinks comes back up. Pt brought half full grape Fanta into triage

## 2013-09-27 ENCOUNTER — Encounter (HOSPITAL_COMMUNITY): Payer: Self-pay | Admitting: Emergency Medicine

## 2013-09-27 DIAGNOSIS — Z792 Long term (current) use of antibiotics: Secondary | ICD-10-CM | POA: Insufficient documentation

## 2013-09-27 DIAGNOSIS — Z8709 Personal history of other diseases of the respiratory system: Secondary | ICD-10-CM | POA: Insufficient documentation

## 2013-09-27 DIAGNOSIS — I10 Essential (primary) hypertension: Secondary | ICD-10-CM | POA: Insufficient documentation

## 2013-09-27 DIAGNOSIS — R05 Cough: Secondary | ICD-10-CM | POA: Insufficient documentation

## 2013-09-27 DIAGNOSIS — E119 Type 2 diabetes mellitus without complications: Secondary | ICD-10-CM | POA: Insufficient documentation

## 2013-09-27 DIAGNOSIS — R197 Diarrhea, unspecified: Secondary | ICD-10-CM | POA: Insufficient documentation

## 2013-09-27 DIAGNOSIS — Z9889 Other specified postprocedural states: Secondary | ICD-10-CM | POA: Insufficient documentation

## 2013-09-27 DIAGNOSIS — R51 Headache: Secondary | ICD-10-CM | POA: Insufficient documentation

## 2013-09-27 DIAGNOSIS — Z79899 Other long term (current) drug therapy: Secondary | ICD-10-CM | POA: Insufficient documentation

## 2013-09-27 DIAGNOSIS — G8929 Other chronic pain: Secondary | ICD-10-CM | POA: Insufficient documentation

## 2013-09-27 DIAGNOSIS — M549 Dorsalgia, unspecified: Secondary | ICD-10-CM | POA: Insufficient documentation

## 2013-09-27 DIAGNOSIS — R109 Unspecified abdominal pain: Secondary | ICD-10-CM | POA: Insufficient documentation

## 2013-09-27 DIAGNOSIS — Z8619 Personal history of other infectious and parasitic diseases: Secondary | ICD-10-CM | POA: Insufficient documentation

## 2013-09-27 DIAGNOSIS — Z9089 Acquired absence of other organs: Secondary | ICD-10-CM | POA: Insufficient documentation

## 2013-09-27 DIAGNOSIS — F319 Bipolar disorder, unspecified: Secondary | ICD-10-CM | POA: Insufficient documentation

## 2013-09-27 DIAGNOSIS — R059 Cough, unspecified: Secondary | ICD-10-CM | POA: Insufficient documentation

## 2013-09-27 DIAGNOSIS — F17200 Nicotine dependence, unspecified, uncomplicated: Secondary | ICD-10-CM | POA: Insufficient documentation

## 2013-09-27 DIAGNOSIS — Z794 Long term (current) use of insulin: Secondary | ICD-10-CM | POA: Insufficient documentation

## 2013-09-27 NOTE — ED Notes (Signed)
[facility] /productive cough for 1 week , denies fever or chills. CBG = 106 by EMS . Respirations unlabored . Alert and oriented /ambulatory.

## 2013-09-28 ENCOUNTER — Emergency Department (HOSPITAL_COMMUNITY)
Admission: EM | Admit: 2013-09-28 | Discharge: 2013-09-28 | Disposition: A | Discharge status: Home / Self Care | Payer: Medicare Other | Attending: Emergency Medicine | Admitting: Emergency Medicine

## 2013-09-28 ENCOUNTER — Emergency Department (HOSPITAL_COMMUNITY): Payer: Medicare Other

## 2013-09-28 DIAGNOSIS — R197 Diarrhea, unspecified: Secondary | ICD-10-CM

## 2013-09-28 DIAGNOSIS — R059 Cough, unspecified: Secondary | ICD-10-CM

## 2013-09-28 DIAGNOSIS — R05 Cough: Secondary | ICD-10-CM

## 2013-09-28 HISTORY — DX: Homelessness: Z59.0

## 2013-09-28 HISTORY — DX: Homelessness unspecified: Z59.00

## 2013-09-28 LAB — COMPREHENSIVE METABOLIC PANEL
ALT: 30 U/L (ref 0–53)
AST: 23 U/L (ref 0–37)
Albumin: 3.3 g/dL — ABNORMAL LOW (ref 3.5–5.2)
Alkaline Phosphatase: 67 U/L (ref 39–117)
BUN: 9 mg/dL (ref 6–23)
CALCIUM: 8.9 mg/dL (ref 8.4–10.5)
CO2: 23 meq/L (ref 19–32)
CREATININE: 0.9 mg/dL (ref 0.50–1.35)
Chloride: 109 mEq/L (ref 96–112)
GFR calc Af Amer: >90 mL/min (ref >90)
Glucose, Bld: 116 mg/dL — ABNORMAL HIGH (ref 70–99)
Potassium: 3.7 mEq/L (ref 3.7–5.3)
SODIUM: 144 meq/L (ref 137–147)
TOTAL PROTEIN: 6.6 g/dL (ref 6.0–8.3)
Total Bilirubin: 0.3 mg/dL (ref 0.3–1.2)

## 2013-09-28 LAB — CBC WITH DIFFERENTIAL/PLATELET
Basophils Absolute: 0.1 10*3/uL (ref 0.0–0.1)
Basophils Relative: 1 % (ref 0–1)
EOS ABS: 1.1 10*3/uL — AB (ref 0.0–0.7)
EOS PCT: 16 % — AB (ref 0–5)
HEMATOCRIT: 41.3 % (ref 39.0–52.0)
Hemoglobin: 13.8 g/dL (ref 13.0–17.0)
LYMPHS PCT: 32 % (ref 12–46)
Lymphs Abs: 2.1 10*3/uL (ref 0.7–4.0)
MCH: 30.1 pg (ref 26.0–34.0)
MCHC: 33.4 g/dL (ref 30.0–36.0)
MCV: 90 fL (ref 78.0–100.0)
MONO ABS: 0.6 10*3/uL (ref 0.1–1.0)
Monocytes Relative: 9 % (ref 3–12)
Neutro Abs: 2.7 10*3/uL (ref 1.7–7.7)
Neutrophils Relative %: 41 % — ABNORMAL LOW (ref 43–77)
PLATELETS: 113 10*3/uL — AB (ref 150–400)
RBC: 4.59 MIL/uL (ref 4.22–5.81)
RDW: 13.3 % (ref 11.5–15.5)
WBC: 6.6 10*3/uL (ref 4.0–10.5)

## 2013-09-28 LAB — CLOSTRIDIUM DIFFICILE BY PCR: Toxigenic C. Difficile by PCR: NEGATIVE

## 2013-09-28 MED ORDER — ALBUTEROL SULFATE HFA 108 (90 BASE) MCG/ACT IN AERS
2.0000 | INHALATION_SPRAY | Freq: Once | RESPIRATORY_TRACT | Status: AC
  Administered 2013-09-28: 2 via RESPIRATORY_TRACT
  Filled 2013-09-28: qty 6.7

## 2013-09-28 MED ORDER — OXYCODONE-ACETAMINOPHEN 5-325 MG PO TABS
1.0000 | ORAL_TABLET | Freq: Once | ORAL | Status: AC
  Administered 2013-09-28: 1 via ORAL
  Filled 2013-09-28: qty 1

## 2013-09-28 MED ORDER — LOPERAMIDE HCL 2 MG PO CAPS
4.0000 mg | ORAL_CAPSULE | Freq: Once | ORAL | Status: AC
  Administered 2013-09-28: 4 mg via ORAL
  Filled 2013-09-28: qty 2

## 2013-09-28 NOTE — ED Provider Notes (Signed)
CSN: [id number]     Arrival date & time 09/27/13  2343 History   First MD Initiated Contact with Patient 09/28/13 0350     Chief Complaint  Patient presents with  . Diarrhea  . Cough      Patient is a 53 y.o. male presenting with diarrhea. The history is provided by the patient.  Diarrhea Quality:  Watery Severity:  Moderate Onset quality:  Gradual Duration:  1 month Timing:  Intermittent Progression:  Worsening Relieved by:  Nothing Worsened by:  Nothing tried Associated symptoms: abdominal pain, cough and headaches   Associated symptoms: no fever and no vomiting   Risk factors: recent antibiotic use   Risk factors: no travel to endemic areas   pt reports he has had nonbloody diarrhea for one month He also reports associated abd cramping No vomiting  He also reports recent cough.  He reports recent bout of bronchitis and had been placed on azithromycin, but then his symptoms returned.   He reports due to his cough he is now having back pain and also headache   Past Medical History  Diagnosis Date  . Diabetes mellitus   . Hepatitis C carrier   . WPW (Wolff-Parkinson-White syndrome)   . Depression   . Chronic pain   . Suicide attempt   . Diabetes mellitus without complication   . Hypertension   . Bipolar 1 disorder   . HCV (hepatitis C virus)   . Homeless    Past Surgical History  Procedure Laterality Date  . Rhinoplasty    . Chest surgery    . Back surgery    . Tonsillectomy     No family history on file. History  Substance Use Topics  . Smoking status: Current Every Day Smoker -- 0.25 packs/day for 8 years    Types: Cigarettes  . Smokeless tobacco: Not on file  . Alcohol Use: Yes     Comment: heavy    Review of Systems  Constitutional: Negative for fever.  Respiratory: Positive for cough.   Gastrointestinal: Positive for abdominal pain and diarrhea. Negative for vomiting and blood in stool.  Musculoskeletal: Positive for back pain.  Neurological:  Positive for headaches. Negative for weakness.  All other systems reviewed and are negative.     Allergies  Review of patient's allergies indicates no known allergies.  Home Medications   Prior to Admission medications   Medication Sig Start Date End Date Taking? Authorizing Provider  gabapentin (NEURONTIN) 400 MG capsule Take 1 capsule (400 mg total) by mouth 3 (three) times daily. 09/12/13  Yes Suzi RootsKevin E Steinl, MD  insulin glargine (LANTUS) 100 UNIT/ML injection Inject 30-40 Units into the skin at bedtime. Takes 40 units of lantus if blood sugar over 200   Yes Historical Provider, MD  omeprazole (PRILOSEC) 20 MG capsule Take 1 capsule (20 mg total) by mouth daily. 09/12/13  Yes Suzi RootsKevin E Steinl, MD  traZODone (DESYREL) 100 MG tablet Take 3 tablets (300 mg total) by mouth at bedtime. 09/12/13  Yes Suzi RootsKevin E Steinl, MD   BP 150/83  Pulse 54  Temp(Src) 97.3 F (36.3 C) (Oral)  Resp 20  SpO2 94% Physical Exam CONSTITUTIONAL: Well developed/well nourished HEAD: Normocephalic/atraumatic EYES: EOMI/PERRL ENMT: Mucous membranes moist NECK: supple no meningeal signs SPINE:entire spine nontender CV: S1/S2 noted, no murmurs/rubs/gallops noted LUNGS: Lungs are clear to auscultation bilaterally, no apparent distress ABDOMEN: soft, nontender, no rebound or guarding GU:no cva tenderness NEURO: Pt is awake/alert, moves all extremitiesx4, pt is ambulatory  without difficulty EXTREMITIES: pulses normal, full ROM SKIN: warm, color normal PSYCH: no abnormalities of mood noted  ED Course  Procedures Labs reassuring Stool studies currently pending He felt improved after using albuterol inhaler Stable for d/c and will need followup on his stool studies  He reports his HA and back pain are improved  Labs Review Labs Reviewed  CBC WITH DIFFERENTIAL - Abnormal; Notable for the following:    Platelets 113 (*)    Neutrophils Relative % 41 (*)    Eosinophils Relative 16 (*)    Eosinophils Absolute  1.1 (*)    All other components within normal limits  COMPREHENSIVE METABOLIC PANEL - Abnormal; Notable for the following:    Glucose, Bld 116 (*)    Albumin 3.3 (*)    All other components within normal limits  CLOSTRIDIUM DIFFICILE BY PCR  STOOL CULTURE    Imaging Review Dg Chest 2 View  09/28/2013   CLINICAL DATA:  Diarrhea for 1 month.  Productive cough for 1 week.  EXAM: CHEST  2 VIEW  COMPARISON:  Chest radiograph performed 09/17/2011  FINDINGS: The lungs are well-aerated. Mild bibasilar airspace opacities likely reflect atelectasis. There is no evidence of pleural effusion or pneumothorax.  The heart is normal in size; the mediastinal contour is within normal limits. No acute osseous abnormalities are seen.  IMPRESSION: Mild bibasilar airspace opacities likely reflect atelectasis; lungs otherwise clear.   Electronically Signed   By: Roanna RaiderJeffery  Chang M.D.   On: 09/28/2013 00:46   MDM   Final diagnoses:  Cough  Diarrhea    Nursing notes including past medical history and social history reviewed and considered in documentation Labs/vital reviewed and considered   Number at [facility] - [phone]  Joya Gaskinsonald W Furious Chiarelli, MD 09/28/13 [phone]

## 2013-10-03 LAB — STOOL CULTURE

## 2013-10-26 ENCOUNTER — Emergency Department (HOSPITAL_COMMUNITY)
Admission: EM | Admit: 2013-10-26 | Discharge: 2013-10-26 | Disposition: A | Discharge status: Home / Self Care | Payer: Medicare Other | Attending: Emergency Medicine | Admitting: Emergency Medicine

## 2013-10-26 ENCOUNTER — Encounter (HOSPITAL_COMMUNITY): Payer: Self-pay | Admitting: Emergency Medicine

## 2013-10-26 DIAGNOSIS — S99919A Unspecified injury of unspecified ankle, initial encounter: Secondary | ICD-10-CM

## 2013-10-26 DIAGNOSIS — Y9289 Other specified places as the place of occurrence of the external cause: Secondary | ICD-10-CM | POA: Insufficient documentation

## 2013-10-26 DIAGNOSIS — Y9301 Activity, walking, marching and hiking: Secondary | ICD-10-CM | POA: Insufficient documentation

## 2013-10-26 DIAGNOSIS — Z79899 Other long term (current) drug therapy: Secondary | ICD-10-CM | POA: Insufficient documentation

## 2013-10-26 DIAGNOSIS — S99929A Unspecified injury of unspecified foot, initial encounter: Secondary | ICD-10-CM

## 2013-10-26 DIAGNOSIS — I824Y9 Acute embolism and thrombosis of unspecified deep veins of unspecified proximal lower extremity: Secondary | ICD-10-CM | POA: Insufficient documentation

## 2013-10-26 DIAGNOSIS — G8929 Other chronic pain: Secondary | ICD-10-CM | POA: Insufficient documentation

## 2013-10-26 DIAGNOSIS — I10 Essential (primary) hypertension: Secondary | ICD-10-CM | POA: Insufficient documentation

## 2013-10-26 DIAGNOSIS — X500XXA Overexertion from strenuous movement or load, initial encounter: Secondary | ICD-10-CM | POA: Insufficient documentation

## 2013-10-26 DIAGNOSIS — Z8619 Personal history of other infectious and parasitic diseases: Secondary | ICD-10-CM | POA: Insufficient documentation

## 2013-10-26 DIAGNOSIS — R609 Edema, unspecified: Secondary | ICD-10-CM

## 2013-10-26 DIAGNOSIS — I82419 Acute embolism and thrombosis of unspecified femoral vein: Secondary | ICD-10-CM

## 2013-10-26 DIAGNOSIS — Z5900 Homelessness unspecified: Secondary | ICD-10-CM | POA: Insufficient documentation

## 2013-10-26 DIAGNOSIS — E119 Type 2 diabetes mellitus without complications: Secondary | ICD-10-CM | POA: Insufficient documentation

## 2013-10-26 DIAGNOSIS — S8990XA Unspecified injury of unspecified lower leg, initial encounter: Secondary | ICD-10-CM | POA: Insufficient documentation

## 2013-10-26 DIAGNOSIS — Z794 Long term (current) use of insulin: Secondary | ICD-10-CM | POA: Insufficient documentation

## 2013-10-26 DIAGNOSIS — F17200 Nicotine dependence, unspecified, uncomplicated: Secondary | ICD-10-CM | POA: Insufficient documentation

## 2013-10-26 DIAGNOSIS — F319 Bipolar disorder, unspecified: Secondary | ICD-10-CM | POA: Insufficient documentation

## 2013-10-26 LAB — BASIC METABOLIC PANEL
BUN: 9 mg/dL (ref 6–23)
CALCIUM: 10 mg/dL (ref 8.4–10.5)
CO2: 24 mEq/L (ref 19–32)
Chloride: 102 mEq/L (ref 96–112)
Creatinine, Ser: 0.86 mg/dL (ref 0.50–1.35)
GFR calc Af Amer: >90 mL/min (ref >90)
GFR calc non Af Amer: >90 mL/min (ref >90)
GLUCOSE: 241 mg/dL — AB (ref 70–99)
Potassium: 4.1 mEq/L (ref 3.7–5.3)
SODIUM: 139 meq/L (ref 137–147)

## 2013-10-26 LAB — CBC WITH DIFFERENTIAL/PLATELET
BASOS ABS: 0 10*3/uL (ref 0.0–0.1)
BASOS PCT: 1 % (ref 0–1)
EOS ABS: 0.2 10*3/uL (ref 0.0–0.7)
EOS PCT: 3 % (ref 0–5)
HCT: 45.6 % (ref 39.0–52.0)
Hemoglobin: 15.7 g/dL (ref 13.0–17.0)
Lymphocytes Relative: 25 % (ref 12–46)
Lymphs Abs: 1.9 10*3/uL (ref 0.7–4.0)
MCH: 30.7 pg (ref 26.0–34.0)
MCHC: 34.4 g/dL (ref 30.0–36.0)
MCV: 89.2 fL (ref 78.0–100.0)
Monocytes Absolute: 0.5 10*3/uL (ref 0.1–1.0)
Monocytes Relative: 7 % (ref 3–12)
NEUTROS PCT: 64 % (ref 43–77)
Neutro Abs: 4.9 10*3/uL (ref 1.7–7.7)
PLATELETS: 147 10*3/uL — AB (ref 150–400)
RBC: 5.11 MIL/uL (ref 4.22–5.81)
RDW: 13.5 % (ref 11.5–15.5)
WBC: 7.6 10*3/uL (ref 4.0–10.5)

## 2013-10-26 MED ORDER — RIVAROXABAN 15 MG PO TABS
15.0000 mg | ORAL_TABLET | Freq: Once | ORAL | Status: AC
  Administered 2013-10-26: 15 mg via ORAL
  Filled 2013-10-26: qty 1

## 2013-10-26 MED ORDER — RIVAROXABAN (XARELTO) EDUCATION KIT FOR DVT/PE PATIENTS
PACK | Freq: Once | Status: DC
  Filled 2013-10-26: qty 1

## 2013-10-26 MED ORDER — TRAMADOL HCL 50 MG PO TABS
50.0000 mg | ORAL_TABLET | Freq: Once | ORAL | Status: AC
  Administered 2013-10-26: 50 mg via ORAL
  Filled 2013-10-26: qty 1

## 2013-10-26 MED ORDER — XARELTO VTE STARTER PACK 15 & 20 MG PO TBPK: 15.0000 mg | ORAL_TABLET | ORAL | Status: DC

## 2013-10-26 MED ORDER — TRAMADOL HCL 50 MG PO TABS: 50.0000 mg | ORAL_TABLET | Freq: Four times a day (QID) | ORAL | Status: DC | PRN

## 2013-10-26 NOTE — ED Provider Notes (Signed)
Medical screening examination/treatment/procedure(s) were conducted as a shared visit with non-physician practitioner(s) and myself.  I personally evaluated the patient during the encounter.   EKG Interpretation None      DVT on [place], done for R leg pain and swelling. Good pulses distally, mild swelling to leg. No concern for phlegmasia. Given Xarelto.    Dagmar Hait, MD 10/26/13 [phone]

## 2013-10-26 NOTE — ED Notes (Signed)
Pt reports that he was walking up some stairs and felt a pop in his right leg. States that the area is swollen.

## 2013-10-26 NOTE — Discharge Instructions (Signed)
Take Xarelto as directed. Follow up with your doctor for further evaluation. Take Tramadol as needed for pain. Refer to attached documents for more information.   Information on my medicine - XARELTO (rivaroxaban)  This medication education was reviewed with me or my healthcare representative as part of my discharge preparation.  The pharmacist that spoke with me during my hospital stay was:  Severiano Gilbert, [facility]  WHY WAS Carlena Hurl PRESCRIBED FOR YOU? Xarelto was prescribed to treat blood clots that may have been found in the veins of your legs (deep vein thrombosis) or in your lungs (pulmonary embolism) and to reduce the risk of them occurring again.  What do you need to know about Xarelto? The starting dose is one 15 mg tablet taken TWICE daily with food for the FIRST 21 DAYS then on (enter date)  11/17/13  the dose is changed to one 20 mg tablet taken ONCE A DAY with your evening meal.  DO NOT stop taking Xarelto without talking to the health care provider who prescribed the medication.  Refill your prescription for 20 mg tablets before you run out.  After discharge, you should have regular check-up appointments with your healthcare provider that is prescribing your Xarelto.  In the future your dose may need to be changed if your kidney function changes by a significant amount.  What do you do if you miss a dose? If you are taking Xarelto TWICE DAILY and you miss a dose, take it as soon as you remember. You may take two 15 mg tablets (total 30 mg) at the same time then resume your regularly scheduled 15 mg twice daily the next day.  If you are taking Xarelto ONCE DAILY and you miss a dose, take it as soon as you remember on the same day then continue your regularly scheduled once daily regimen the next day. Do not take two doses of Xarelto at the same time.   Important Safety Information Xarelto is a blood thinner medicine that can cause bleeding. You should call your healthcare  provider right away if you experience any of the following:   Bleeding from an injury or your nose that does not stop.   Unusual colored urine (red or dark brown) or unusual colored stools (red or black).   Unusual bruising for unknown reasons.   A serious fall or if you hit your head (even if there is no bleeding).  Some medicines may interact with Xarelto and might increase your risk of bleeding while on Xarelto. To help avoid this, consult your healthcare provider or pharmacist prior to using any new prescription or non-prescription medications, including herbals, vitamins, non-steroidal anti-inflammatory drugs (NSAIDs) and supplements.  This website has more information on Xarelto: VisitDestination.com.br.

## 2013-10-26 NOTE — ED Provider Notes (Signed)
CSN: [id number]     Arrival date & time 10/26/13  0920 History  This chart was scribed for non-physician practitioner Emilia Beck, PA-C working with Dagmar Hait, MD by Dorothey Baseman, ED Scribe. This patient was seen in room TR05C/TR05C and the patient's care was started at 10:02 AM.    Chief Complaint  Patient presents with  . Leg Pain   Patient is a 53 y.o. male presenting with leg pain. The history is provided by the patient. No language interpreter was used.  Leg Pain Location:  Leg Leg location:  R upper leg Pain details:    Severity:  Moderate   Onset quality:  Gradual   Timing:  Constant   Progression:  Unchanged Chronicity:  New Relieved by:  None tried Worsened by:  Bearing weight Ineffective treatments:  None tried Associated symptoms: swelling   Associated symptoms: no fever    HPI Comments: DAQUANN DAZA is a 53 y.o. male with a history of chronic pain who presents to the Emergency Department complaining of a constant pain, rated 7/10 currently, with associated swelling to the right, medial thigh, extending from the knee to the groin, onset 2 days ago. Patient reports that his pain presented after he was walking up the stairs and felt a pop in the area. He states that the pain is exacerbated with walking/bearing weight. He denies history of DVT/PE or recent extended travel/prolonged periods of immobilization. He denies fever. Patient also has a history of DM, HTN, and Hepatitis C.   Past Medical History  Diagnosis Date  . Diabetes mellitus   . Hepatitis C carrier   . WPW (Wolff-Parkinson-White syndrome)   . Depression   . Chronic pain   . Suicide attempt   . Diabetes mellitus without complication   . Hypertension   . Bipolar 1 disorder   . HCV (hepatitis C virus)   . Homeless    Past Surgical History  Procedure Laterality Date  . Rhinoplasty    . Chest surgery    . Back surgery    . Tonsillectomy     No family history on file. History   Substance Use Topics  . Smoking status: Current Every Day Smoker -- 0.25 packs/day for 8 years    Types: Cigarettes  . Smokeless tobacco: Not on file  . Alcohol Use: Yes     Comment: heavy    Review of Systems  Constitutional: Negative for fever.  Musculoskeletal: Positive for myalgias.       Swelling   All other systems reviewed and are negative.   Allergies  Review of patient's allergies indicates no known allergies.  Home Medications   Prior to Admission medications   Medication Sig Start Date End Date Taking? Authorizing Provider  gabapentin (NEURONTIN) 400 MG capsule Take 1 capsule (400 mg total) by mouth 3 (three) times daily. 09/12/13   Suzi Roots, MD  insulin glargine (LANTUS) 100 UNIT/ML injection Inject 30-40 Units into the skin at bedtime. Takes 40 units of lantus if blood sugar over 200    Historical Provider, MD  omeprazole (PRILOSEC) 20 MG capsule Take 1 capsule (20 mg total) by mouth daily. 09/12/13   Suzi Roots, MD  traZODone (DESYREL) 100 MG tablet Take 3 tablets (300 mg total) by mouth at bedtime. 09/12/13   Suzi Roots, MD   Triage Vitals: BP 186/98  Pulse 95  Temp(Src) 98.7 F (37.1 C)  Resp 18  Ht 5\' 11"  (1.803 m)  Wt 233  lb (105.688 kg)  BMI 32.51 kg/m2  SpO2 95%  Physical Exam  Nursing note and vitals reviewed. Constitutional: He is oriented to person, place, and time. He appears well-developed and well-nourished. No distress.  HENT:  Head: Normocephalic and atraumatic.  Eyes: Conjunctivae are normal.  Neck: Normal range of motion. Neck supple.  Cardiovascular: Intact distal pulses.   Pulmonary/Chest: Effort normal. No respiratory distress.  Abdominal: He exhibits no distension.  Musculoskeletal: Normal range of motion. He exhibits edema and tenderness.  Generalized edema to the right leg. Right popliteal tenderness to palpation. No calf tenderness. Pulses intact.   Neurological: He is alert and oriented to person, place, and time.   Skin: Skin is warm and dry. There is erythema.  Generalized, mild erythema of the right leg.   Psychiatric: He has a normal mood and affect. His behavior is normal.    ED Course  Procedures (including critical care time)  DIAGNOSTIC STUDIES: Oxygen Saturation is 95% on room air, normal by my interpretation.    COORDINATION OF CARE: 10:06 AM- Will order CBC and BMP. Discussed treatment plan with patient at bedside and patient verbalized agreement.     Labs Review Labs Reviewed  CBC WITH DIFFERENTIAL - Abnormal; Notable for the following:    Platelets 147 (*)    All other components within normal limits  BASIC METABOLIC PANEL - Abnormal; Notable for the following:    Glucose, Bld 241 (*)    All other components within normal limits    Imaging Review No results found.   EKG Interpretation None      MDM   Final diagnoses:  DVT, femoral, acute    Patient has a DVT in femoral vein of right leg. Patient will be started on Xarelto and advised to follow up with PCP. Vitals stable and patient afebrile. Patient denies chest pain or SOB.   I personally performed the services described in this documentation, which was scribed in my presence. The recorded information has been reviewed and is accurate.     Emilia BeckKaitlyn Breeley Bischof, PA-C 10/26/13 1504

## 2013-10-26 NOTE — Progress Notes (Signed)
*  PRELIMINARY RESULTS* Vascular Ultrasound Lower extremity venous duplex has been completed.  Preliminary findings: RIGHT = DVT involving common femoral and femoral veins. Patent popliteal, posterior tibial, and peroneal veins. LEFT = no evidence of DVT.   Farrel Demark, RDMS, RVT  10/26/2013, 10:45 AM

## 2013-10-26 NOTE — ED Notes (Signed)
No open beds in POD A at this time.  Will keep patient in fast track per [place], [place].

## 2013-11-30 ENCOUNTER — Ambulatory Visit: Payer: Self-pay | Admitting: Internal Medicine

## 2014-01-23 ENCOUNTER — Emergency Department (HOSPITAL_COMMUNITY): Payer: Medicare Other

## 2014-01-23 ENCOUNTER — Encounter (HOSPITAL_COMMUNITY): Payer: Self-pay | Admitting: Emergency Medicine

## 2014-01-23 ENCOUNTER — Emergency Department (HOSPITAL_COMMUNITY)
Admission: EM | Admit: 2014-01-23 | Discharge: 2014-01-23 | Disposition: A | Discharge status: Home / Self Care | Payer: Medicare Other | Attending: Emergency Medicine | Admitting: Emergency Medicine

## 2014-01-23 DIAGNOSIS — R079 Chest pain, unspecified: Secondary | ICD-10-CM | POA: Diagnosis not present

## 2014-01-23 DIAGNOSIS — Z5900 Homelessness unspecified: Secondary | ICD-10-CM | POA: Insufficient documentation

## 2014-01-23 DIAGNOSIS — F319 Bipolar disorder, unspecified: Secondary | ICD-10-CM | POA: Insufficient documentation

## 2014-01-23 DIAGNOSIS — J40 Bronchitis, not specified as acute or chronic: Secondary | ICD-10-CM

## 2014-01-23 DIAGNOSIS — G8929 Other chronic pain: Secondary | ICD-10-CM | POA: Diagnosis not present

## 2014-01-23 DIAGNOSIS — Z794 Long term (current) use of insulin: Secondary | ICD-10-CM | POA: Insufficient documentation

## 2014-01-23 DIAGNOSIS — Z9889 Other specified postprocedural states: Secondary | ICD-10-CM | POA: Diagnosis not present

## 2014-01-23 DIAGNOSIS — E119 Type 2 diabetes mellitus without complications: Secondary | ICD-10-CM | POA: Diagnosis not present

## 2014-01-23 DIAGNOSIS — F17200 Nicotine dependence, unspecified, uncomplicated: Secondary | ICD-10-CM | POA: Insufficient documentation

## 2014-01-23 DIAGNOSIS — Z79899 Other long term (current) drug therapy: Secondary | ICD-10-CM | POA: Insufficient documentation

## 2014-01-23 DIAGNOSIS — R739 Hyperglycemia, unspecified: Secondary | ICD-10-CM

## 2014-01-23 DIAGNOSIS — I10 Essential (primary) hypertension: Secondary | ICD-10-CM | POA: Diagnosis not present

## 2014-01-23 DIAGNOSIS — Z8619 Personal history of other infectious and parasitic diseases: Secondary | ICD-10-CM | POA: Diagnosis not present

## 2014-01-23 DIAGNOSIS — Z86718 Personal history of other venous thrombosis and embolism: Secondary | ICD-10-CM | POA: Diagnosis not present

## 2014-01-23 LAB — BASIC METABOLIC PANEL
Anion gap: 18 — ABNORMAL HIGH (ref 5–15)
BUN: 6 mg/dL (ref 6–23)
CALCIUM: 9.4 mg/dL (ref 8.4–10.5)
CO2: 18 mEq/L — ABNORMAL LOW (ref 19–32)
Chloride: 103 mEq/L (ref 96–112)
Creatinine, Ser: 0.93 mg/dL (ref 0.50–1.35)
GFR calc Af Amer: >90 mL/min (ref >90)
Glucose, Bld: 297 mg/dL — ABNORMAL HIGH (ref 70–99)
POTASSIUM: 4.1 meq/L (ref 3.7–5.3)
Sodium: 139 mEq/L (ref 137–147)

## 2014-01-23 LAB — CBC
HCT: 53.2 % — ABNORMAL HIGH (ref 39.0–52.0)
Hemoglobin: 18.1 g/dL — ABNORMAL HIGH (ref 13.0–17.0)
MCH: 30.8 pg (ref 26.0–34.0)
MCHC: 34 g/dL (ref 30.0–36.0)
MCV: 90.5 fL (ref 78.0–100.0)
Platelets: 101 10*3/uL — ABNORMAL LOW (ref 150–400)
RBC: 5.88 MIL/uL — ABNORMAL HIGH (ref 4.22–5.81)
RDW: 14.3 % (ref 11.5–15.5)
WBC: 6.6 10*3/uL (ref 4.0–10.5)

## 2014-01-23 LAB — I-STAT TROPONIN, ED
Troponin i, poc: 0 ng/mL (ref 0.00–0.08)
Troponin i, poc: 0 ng/mL (ref 0.00–0.08)

## 2014-01-23 MED ORDER — TRAMADOL HCL 50 MG PO TABS: 50.0000 mg | ORAL_TABLET | Freq: Four times a day (QID) | ORAL | Status: DC | PRN

## 2014-01-23 MED ORDER — OXYCODONE-ACETAMINOPHEN 5-325 MG PO TABS
2.0000 | ORAL_TABLET | Freq: Once | ORAL | Status: AC
  Administered 2014-01-23: 2 via ORAL
  Filled 2014-01-23: qty 2

## 2014-01-23 MED ORDER — ALBUTEROL SULFATE HFA 108 (90 BASE) MCG/ACT IN AERS
2.0000 | INHALATION_SPRAY | Freq: Once | RESPIRATORY_TRACT | Status: AC
  Administered 2014-01-23: 2 via RESPIRATORY_TRACT

## 2014-01-23 MED ORDER — LISINOPRIL 10 MG PO TABS
10.0000 mg | ORAL_TABLET | Freq: Once | ORAL | Status: AC
  Administered 2014-01-23: 10 mg via ORAL
  Filled 2014-01-23: qty 1

## 2014-01-23 MED ORDER — ALBUTEROL SULFATE HFA 108 (90 BASE) MCG/ACT IN AERS
2.0000 | INHALATION_SPRAY | Freq: Once | RESPIRATORY_TRACT | Status: DC
  Filled 2014-01-23: qty 6.7

## 2014-01-23 MED ORDER — ALBUTEROL SULFATE 2 MG/5ML PO SYRP
2.0000 mg | ORAL_SOLUTION | Freq: Once | ORAL | Status: DC
  Filled 2014-01-23: qty 5

## 2014-01-23 MED ORDER — DEXTROMETHORPHAN HBR 15 MG/5ML PO SYRP: 10.0000 mL | ORAL_SOLUTION | Freq: Four times a day (QID) | ORAL | Status: DC | PRN

## 2014-01-23 MED ORDER — ENOXAPARIN SODIUM 120 MG/0.8ML ~~LOC~~ SOLN
1.0000 mg/kg | Freq: Once | SUBCUTANEOUS | Status: AC
  Administered 2014-01-23: 05:00:00 via SUBCUTANEOUS
  Filled 2014-01-23: qty 0.8

## 2014-01-23 MED ORDER — HYDROMORPHONE HCL PF 1 MG/ML IJ SOLN
1.0000 mg | Freq: Once | INTRAMUSCULAR | Status: AC
  Administered 2014-01-23: 1 mg via INTRAVENOUS
  Filled 2014-01-23: qty 1

## 2014-01-23 MED ORDER — IOHEXOL 350 MG/ML SOLN
80.0000 mL | Freq: Once | INTRAVENOUS | Status: AC | PRN
  Administered 2014-01-23: 60 mL via INTRAVENOUS

## 2014-01-23 NOTE — ED Provider Notes (Signed)
CSN: [id number]     Arrival date & time 01/23/14  1324 History   First MD Initiated Contact with Patient 01/23/14 [phone]     Chief Complaint  Patient presents with  . Chest Pain     (Consider location/radiation/quality/duration/timing/severity/associated sxs/prior Treatment) HPI  Patient is a 53 yo man who comes in with complaints of left sided chest pain which is pleuritic in nature. His sx awoke him from sleep around 2100. He was able to go back to sleep but awoke again shortly PTA with more severe pleuritic chest pain, "like somebody shot me" and SOB. Pain is severe. Patient says he had to crawl to his phone to call 911. He denies history of similar sx. Denies any history of CAD,.   Of note, the patient was diagnosed with a spontaneous right LE DVT in May 2015 which was treated with Xarelto. This medication was discontinued by his PCP approximately 1 week ago. The patient has no other history of VTE.   Pain is nonradiating, worse with deep breaths, patient feels most comortable at rest.   Past Medical History  Diagnosis Date  . Diabetes mellitus   . Hepatitis C carrier   . WPW (Wolff-Parkinson-White syndrome)   . Depression   . Chronic pain   . Suicide attempt   . Diabetes mellitus without complication   . Hypertension   . Bipolar 1 disorder   . HCV (hepatitis C virus)   . Homeless    Past Surgical History  Procedure Laterality Date  . Rhinoplasty    . Chest surgery    . Back surgery    . Tonsillectomy     No family history on file. History  Substance Use Topics  . Smoking status: Current Every Day Smoker -- 0.25 packs/day for 8 years    Types: Cigarettes  . Smokeless tobacco: Not on file  . Alcohol Use: Yes     Comment: heavy    Review of Systems  10 point review of symptoms obtained and is negative with the exceptions of symptoms noted above.    Allergies  Review of patient's allergies indicates no known allergies.  Home Medications   Prior to Admission  medications   Medication Sig Start Date End Date Taking? Authorizing Provider  gabapentin (NEURONTIN) 400 MG capsule Take 1 capsule (400 mg total) by mouth 3 (three) times daily. 09/12/13   Suzi Roots, MD  insulin glargine (LANTUS) 100 UNIT/ML injection Inject 30-40 Units into the skin at bedtime. Takes 40 units of lantus if blood sugar over 200    Historical Provider, MD  omeprazole (PRILOSEC) 20 MG capsule Take 1 capsule (20 mg total) by mouth daily. 09/12/13   Suzi Roots, MD  traMADol (ULTRAM) 50 MG tablet Take 1 tablet (50 mg total) by mouth every 6 (six) hours as needed. 10/26/13   Kaitlyn Szekalski, PA-C  traZODone (DESYREL) 100 MG tablet Take 3 tablets (300 mg total) by mouth at bedtime. 09/12/13   Suzi Roots, MD  XARELTO STARTER PACK 15 & 20 MG TBPK Take 15-20 mg by mouth as directed. Take as directed on package: Start with one  tablet by mouth twice a day with food. On Day 22, switch to one  tablet once a day with food. 10/26/13   Kaitlyn Szekalski, PA-C   BP 129/87  Pulse 109  Temp(Src) 97.7 F (36.5 C) (Oral)  Resp 20  Ht 6' (1.829 m)  Wt 230 lb (104.327 kg)  BMI 31.19 kg/m2  SpO2 96% Physical Exam  Gen: well nourished and well developed appearing Head: NCAT Ears: normal to inspection Nose: normal to inspection, no epistaxis or drainage Mouth: oral mucsoa is well hydrated appearing, normal posterior oropharynx Neck: supple, no stridor CV: RRR, no murmur, palpable peripheral pulses Resp: lung sounds are clear to auscultation bilaterally, no wheeing or rhonchi or rales, normal respiratory effort.  Abd: soft, nontender, nondistended Extremities: normal to inspection.  Skin: warm and dry Neuro: CN ii - XII, no focal deficitis Psyche; normal affect, cooperative.   ED Course  Procedures (including critical care time) Labs Review  Results for orders placed during the hospital encounter of 01/23/14 (from the past 24 hour(s))  CBC     Status: Abnormal    Collection Time    01/23/14  4:05 AM      Result Value Ref Range   WBC 6.6  4.0 - 10.5 K/uL   RBC 5.88 (*) 4.22 - 5.81 MIL/uL   Hemoglobin 18.1 (*) 13.0 - 17.0 g/dL   HCT 16.1 (*) 09.6 - 04.5 %   MCV 90.5  78.0 - 100.0 fL   MCH 30.8  26.0 - 34.0 pg   MCHC 34.0  30.0 - 36.0 g/dL   RDW 40.9  81.1 - 91.4 %   Platelets 101 (*) 150 - 400 K/uL  BASIC METABOLIC PANEL     Status: Abnormal   Collection Time    01/23/14  4:05 AM      Result Value Ref Range   Sodium 139  137 - 147 mEq/L   Potassium 4.1  3.7 - 5.3 mEq/L   Chloride 103  96 - 112 mEq/L   CO2 18 (*) 19 - 32 mEq/L   Glucose, Bld 297 (*) 70 - 99 mg/dL   BUN 6  6 - 23 mg/dL   Creatinine, Ser 7.82  0.50 - 1.35 mg/dL   Calcium 9.4  8.4 - 95.6 mg/dL   GFR calc non Af Amer >90  >90 mL/min   GFR calc Af Amer >90  >90 mL/min   Anion gap 18 (*) 5 - 15  I-STAT TROPOININ, ED     Status: None   Collection Time    01/23/14  4:08 AM      Result Value Ref Range   Troponin i, poc 0.00  0.00 - 0.08 ng/mL   Comment 3            EKG: nsr, no acute ischemic changes, normal intervals, normal axis, normal qrs complex      CT Angio Chest PE W/Cm &/Or Wo Cm (Final result)  Result time: 01/23/14 05:22:43    Final result by Rad Results In Interface (01/23/14 05:22:43)    Narrative:   CLINICAL DATA: Pleuritic chest pain. History of DVT.  EXAM: CT ANGIOGRAPHY CHEST WITH CONTRAST  TECHNIQUE: Multidetector CT imaging of the chest was performed using the standard protocol during bolus administration of intravenous contrast. Multiplanar CT image reconstructions and MIPs were obtained to evaluate the vascular anatomy.  CONTRAST: 60mL OMNIPAQUE IOHEXOL 350 MG/ML SOLN  COMPARISON: None.  FINDINGS: Technically adequate study with good opacification of the central and segmental pulmonary arteries. No focal filling defects. No evidence of significant pulmonary embolus. Normal caliber thoracic aorta. No evidence of dissection.  Normal heart  size. The great vessel origins are patent. Esophagus is decompressed. No significant lymphadenopathy in the chest. Respiratory motion artifact in the lungs. No evidence of significant consolidation or airspace disease. No pneumothorax. No pleural effusions.  Upper abdominal images demonstrate diffuse fatty infiltration of the liver and probable scarring in the kidneys. Degenerative changes in the thoracic spine. No destructive bone lesions appreciated.  Review of the MIP images confirms the above findings.  IMPRESSION: No evidence of significant pulmonary embolus.   Electronically Signed By: Burman Nieves M.D. On: 01/23/2014 05:22      MDM   DDX: ACS, pneumothorax, pneumonia, pericardial or pleural effusion, gastritis, GERD/PUD, musculoskeletal pain.   ED work up is non-diagnostic and reassuring with normal CTA of the chest. Normal troponin x 2. WBC wnl. BMP notable for glucose of 300 mg/dL. We have treated with IVF. Patient now complains of productive cough x 2 days and says he thinks that he has bronchitis. We will d/c with Albuterol MDI for symptomatic management along with Robitussin DM. Patient counseled to stop smoking and referred to [facility] for outpatient f/u. Return precautions discussed.    Brandt Loosen, MD 01/23/14 [phone]

## 2014-01-23 NOTE — ED Notes (Addendum)
Cp hurting since yesterday. Reproducible with palpation and breathing. bp 179/114, HR 114. spo2 97% RA. 324 mg asa po. NO PIV. PT. STARTED BACK DRINKING VODKA yesterday for his birthday. States, "sober since 2013; pt. Smoked marijuana." pt. States, "been coughing up junk - beige."

## 2014-01-23 NOTE — Discharge Instructions (Signed)

## 2014-01-30 ENCOUNTER — Ambulatory Visit: Payer: Self-pay | Admitting: Internal Medicine

## 2014-02-28 ENCOUNTER — Other Ambulatory Visit (HOSPITAL_COMMUNITY): Payer: Self-pay | Admitting: Internal Medicine

## 2014-02-28 ENCOUNTER — Ambulatory Visit (HOSPITAL_COMMUNITY)
Admission: RE | Admit: 2014-02-28 | Discharge: 2014-02-28 | Disposition: A | Discharge status: Home / Self Care | Payer: Medicare Other | Source: Ambulatory Visit | Attending: Internal Medicine | Admitting: Internal Medicine

## 2014-02-28 DIAGNOSIS — M79609 Pain in unspecified limb: Secondary | ICD-10-CM | POA: Insufficient documentation

## 2014-02-28 DIAGNOSIS — M79604 Pain in right leg: Secondary | ICD-10-CM

## 2014-02-28 DIAGNOSIS — M7989 Other specified soft tissue disorders: Secondary | ICD-10-CM | POA: Insufficient documentation

## 2014-02-28 DIAGNOSIS — I82409 Acute embolism and thrombosis of unspecified deep veins of unspecified lower extremity: Secondary | ICD-10-CM

## 2014-02-28 NOTE — Progress Notes (Signed)
VASCULAR LAB PRELIMINARY  PRELIMINARY  PRELIMINARY  PRELIMINARY  Right lower extremity venous duplex completed.    Preliminary report:  Right:  DVT noted in the common femoral, femoral, profunda, popliteal, peroneal, and posterior tibial veins .  No evidence of superficial thrombosis.  No Baker's cyst.  Kassem Kibbe, RVS 02/28/2014, 3:26 PM

## 2014-09-12 ENCOUNTER — Encounter (HOSPITAL_COMMUNITY): Payer: Self-pay

## 2014-09-12 ENCOUNTER — Emergency Department (HOSPITAL_COMMUNITY)
Admission: EM | Admit: 2014-09-12 | Discharge: 2014-09-13 | Disposition: A | Discharge status: Other Acute Inpatient Hospital | Payer: Medicare Other | Attending: Emergency Medicine | Admitting: Emergency Medicine

## 2014-09-12 DIAGNOSIS — R000 Tachycardia, unspecified: Secondary | ICD-10-CM | POA: Diagnosis not present

## 2014-09-12 DIAGNOSIS — Z8619 Personal history of other infectious and parasitic diseases: Secondary | ICD-10-CM | POA: Insufficient documentation

## 2014-09-12 DIAGNOSIS — Z590 Homelessness: Secondary | ICD-10-CM | POA: Insufficient documentation

## 2014-09-12 DIAGNOSIS — G8929 Other chronic pain: Secondary | ICD-10-CM | POA: Diagnosis not present

## 2014-09-12 DIAGNOSIS — Z720 Tobacco use: Secondary | ICD-10-CM | POA: Diagnosis not present

## 2014-09-12 DIAGNOSIS — F312 Bipolar disorder, current episode manic severe with psychotic features: Secondary | ICD-10-CM

## 2014-09-12 DIAGNOSIS — F1410 Cocaine abuse, uncomplicated: Secondary | ICD-10-CM | POA: Diagnosis not present

## 2014-09-12 DIAGNOSIS — E119 Type 2 diabetes mellitus without complications: Secondary | ICD-10-CM | POA: Diagnosis not present

## 2014-09-12 DIAGNOSIS — F1310 Sedative, hypnotic or anxiolytic abuse, uncomplicated: Secondary | ICD-10-CM | POA: Diagnosis not present

## 2014-09-12 DIAGNOSIS — F10230 Alcohol dependence with withdrawal, uncomplicated: Secondary | ICD-10-CM | POA: Diagnosis not present

## 2014-09-12 DIAGNOSIS — Z794 Long term (current) use of insulin: Secondary | ICD-10-CM | POA: Insufficient documentation

## 2014-09-12 DIAGNOSIS — I10 Essential (primary) hypertension: Secondary | ICD-10-CM | POA: Diagnosis not present

## 2014-09-12 DIAGNOSIS — F1210 Cannabis abuse, uncomplicated: Secondary | ICD-10-CM | POA: Insufficient documentation

## 2014-09-12 DIAGNOSIS — Z7901 Long term (current) use of anticoagulants: Secondary | ICD-10-CM | POA: Diagnosis not present

## 2014-09-12 DIAGNOSIS — Z79899 Other long term (current) drug therapy: Secondary | ICD-10-CM | POA: Insufficient documentation

## 2014-09-12 DIAGNOSIS — F251 Schizoaffective disorder, depressive type: Secondary | ICD-10-CM | POA: Diagnosis present

## 2014-09-12 DIAGNOSIS — F315 Bipolar disorder, current episode depressed, severe, with psychotic features: Secondary | ICD-10-CM | POA: Diagnosis not present

## 2014-09-12 DIAGNOSIS — R45851 Suicidal ideations: Secondary | ICD-10-CM | POA: Diagnosis present

## 2014-09-12 LAB — COMPREHENSIVE METABOLIC PANEL
ALBUMIN: 4.4 g/dL (ref 3.5–5.2)
ALT: 60 U/L — AB (ref 0–53)
ANION GAP: 12 (ref 5–15)
AST: 56 U/L — ABNORMAL HIGH (ref 0–37)
Alkaline Phosphatase: 57 U/L (ref 39–117)
BUN: 9 mg/dL (ref 6–23)
CO2: 21 mmol/L (ref 19–32)
Calcium: 9.3 mg/dL (ref 8.4–10.5)
Chloride: 104 mmol/L (ref 96–112)
Creatinine, Ser: 1.04 mg/dL (ref 0.50–1.35)
GFR calc Af Amer: >90 mL/min (ref >90)
GFR calc non Af Amer: 81 mL/min — ABNORMAL LOW (ref >90)
Glucose, Bld: 207 mg/dL — ABNORMAL HIGH (ref 70–99)
Potassium: 4 mmol/L (ref 3.5–5.1)
SODIUM: 137 mmol/L (ref 135–145)
TOTAL PROTEIN: 8.3 g/dL (ref 6.0–8.3)
Total Bilirubin: 1.2 mg/dL (ref 0.3–1.2)

## 2014-09-12 LAB — CBC
HEMATOCRIT: 55.2 % — AB (ref 39.0–52.0)
HEMOGLOBIN: 19.3 g/dL — AB (ref 13.0–17.0)
MCH: 33.9 pg (ref 26.0–34.0)
MCHC: 35 g/dL (ref 30.0–36.0)
MCV: 96.8 fL (ref 78.0–100.0)
PLATELETS: 150 10*3/uL (ref 150–400)
RBC: 5.7 MIL/uL (ref 4.22–5.81)
RDW: 13.8 % (ref 11.5–15.5)
WBC: 9.3 10*3/uL (ref 4.0–10.5)

## 2014-09-12 LAB — RAPID URINE DRUG SCREEN, HOSP PERFORMED
Amphetamines: NOT DETECTED
Barbiturates: NOT DETECTED
Benzodiazepines: POSITIVE — AB
Cocaine: POSITIVE — AB
Opiates: NOT DETECTED
TETRAHYDROCANNABINOL: POSITIVE — AB

## 2014-09-12 LAB — LITHIUM LEVEL: Lithium Lvl: <0.06 mmol/L — ABNORMAL LOW (ref 0.80–1.40)

## 2014-09-12 LAB — ETHANOL: Alcohol, Ethyl (B): <5 mg/dL (ref 0–9)

## 2014-09-12 LAB — SALICYLATE LEVEL: Salicylate Lvl: <4 mg/dL (ref 2.8–20.0)

## 2014-09-12 LAB — ACETAMINOPHEN LEVEL

## 2014-09-12 MED ORDER — LORAZEPAM 1 MG PO TABS
1.0000 mg | ORAL_TABLET | Freq: Three times a day (TID) | ORAL | Status: DC | PRN
  Administered 2014-09-12: 1 mg via ORAL
  Filled 2014-09-12: qty 1

## 2014-09-12 MED ORDER — PANTOPRAZOLE SODIUM 40 MG PO TBEC
40.0000 mg | DELAYED_RELEASE_TABLET | Freq: Every day | ORAL | Status: DC
  Administered 2014-09-12 – 2014-09-13 (×2): 40 mg via ORAL
  Filled 2014-09-12 (×2): qty 1

## 2014-09-12 MED ORDER — ACETAMINOPHEN 325 MG PO TABS
650.0000 mg | ORAL_TABLET | ORAL | Status: DC | PRN
  Administered 2014-09-12: 650 mg via ORAL
  Filled 2014-09-12: qty 2

## 2014-09-12 MED ORDER — ALUM & MAG HYDROXIDE-SIMETH 200-200-20 MG/5ML PO SUSP: 30.0000 mL | ORAL | Status: DC | PRN

## 2014-09-12 MED ORDER — CITALOPRAM HYDROBROMIDE 20 MG PO TABS
20.0000 mg | ORAL_TABLET | Freq: Every day | ORAL | Status: DC
Start: 2014-09-13 — End: 2014-09-13
  Administered 2014-09-13: 20 mg via ORAL
  Filled 2014-09-12: qty 1

## 2014-09-12 MED ORDER — GABAPENTIN 400 MG PO CAPS
400.0000 mg | ORAL_CAPSULE | Freq: Three times a day (TID) | ORAL | Status: DC
  Administered 2014-09-12 – 2014-09-13 (×2): 400 mg via ORAL
  Filled 2014-09-12 (×2): qty 1

## 2014-09-12 MED ORDER — LISINOPRIL 10 MG PO TABS
10.0000 mg | ORAL_TABLET | Freq: Every day | ORAL | Status: DC
  Administered 2014-09-12 – 2014-09-13 (×2): 10 mg via ORAL
  Filled 2014-09-12 (×2): qty 1

## 2014-09-12 MED ORDER — LITHIUM CARBONATE 300 MG PO CAPS
300.0000 mg | ORAL_CAPSULE | Freq: Every day | ORAL | Status: DC
  Administered 2014-09-13: 300 mg via ORAL
  Filled 2014-09-12: qty 1

## 2014-09-12 MED ORDER — INSULIN GLARGINE 100 UNIT/ML ~~LOC~~ SOLN
50.0000 [IU] | Freq: Every day | SUBCUTANEOUS | Status: DC
  Administered 2014-09-12: 50 [IU] via SUBCUTANEOUS
  Filled 2014-09-12: qty 0.5

## 2014-09-12 MED ORDER — ALBUTEROL SULFATE HFA 108 (90 BASE) MCG/ACT IN AERS: 2.0000 | INHALATION_SPRAY | Freq: Four times a day (QID) | RESPIRATORY_TRACT | Status: DC | PRN

## 2014-09-12 MED ORDER — LITHIUM CARBONATE 300 MG PO CAPS
600.0000 mg | ORAL_CAPSULE | Freq: Every day | ORAL | Status: DC
  Administered 2014-09-12: 600 mg via ORAL
  Filled 2014-09-12: qty 2

## 2014-09-12 MED ORDER — RIVAROXABAN 20 MG PO TABS
20.0000 mg | ORAL_TABLET | Freq: Every day | ORAL | Status: DC
  Administered 2014-09-12: 20 mg via ORAL
  Filled 2014-09-12 (×2): qty 1

## 2014-09-12 NOTE — ED Notes (Signed)
Gave patient sandwich and sprite.

## 2014-09-12 NOTE — ED Notes (Signed)
Patient reports SI. Denies HI, AVH at present. States that he has been taking his medications and that he recently got a case worker and has a Therapist, sportspsychiatrist. States that he is feeling overwhelmed. States anxiety is at 7/10, depression 11/10. Reports history of suicide attempts. Patient states "I am tired of hurting myself. I want some help".   Encouragement offered. Snack provided.   Q 15 safety checks in place.

## 2014-09-12 NOTE — Progress Notes (Signed)
CSW faxed referred pt to the following hospitals seeking inpatient placement:  [facility]  CSW will also give pt resources regarding food and shelter.  Trish MageBrittney Alitza Cowman, LCSWA [phone] ED CSW 09/12/2014 11:03 PM
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
## 2014-09-12 NOTE — ED Notes (Signed)
[facility] Expected date:  Expected time:  Means of arrival:  Comments: Tr2

## 2014-09-12 NOTE — ED Provider Notes (Signed)
CSN: [id number]     Arrival date & time 09/12/14  1721 History   First MD Initiated Contact with Patient 09/12/14 1802     Chief Complaint  Patient presents with  . Depression  . Suicidal     (Consider location/radiation/quality/duration/timing/severity/associated sxs/prior Treatment) HPI Patient presents with depression and suicidal thoughts. States he would run into traffic. States he has been off his lithium for a few weeks. States he's been off his other medicines also. States they were not working. Denies hallucinations. States he does drink occasionally notes occasional marijuana. He states he rarely smoked crack. Has a previous psychiatric history of depression. Denies abdominal pain. States he has not eaten couple days. He is reportedly homeless.   Past Medical History  Diagnosis Date  . Diabetes mellitus   . Hepatitis C carrier   . WPW (Wolff-Parkinson-White syndrome)   . Depression   . Chronic pain   . Suicide attempt   . Diabetes mellitus without complication   . Hypertension   . Bipolar 1 disorder   . HCV (hepatitis C virus)   . Homeless    Past Surgical History  Procedure Laterality Date  . Rhinoplasty    . Chest surgery    . Back surgery    . Tonsillectomy     History reviewed. No pertinent family history. History  Substance Use Topics  . Smoking status: Current Every Day Smoker -- 0.25 packs/day for 8 years    Types: Cigarettes  . Smokeless tobacco: Not on file  . Alcohol Use: Yes     Comment: heavy    Review of Systems  Constitutional: Negative for activity change and appetite change.  Eyes: Negative for pain.  Respiratory: Negative for chest tightness and shortness of breath.   Cardiovascular: Negative for chest pain and leg swelling.  Gastrointestinal: Negative for nausea, vomiting, abdominal pain and diarrhea.  Genitourinary: Negative for flank pain.  Musculoskeletal: Negative for back pain and neck stiffness.  Skin: Negative for rash.   Neurological: Negative for weakness, numbness and headaches.  Psychiatric/Behavioral: Positive for suicidal ideas and dysphoric mood. Negative for behavioral problems.      Allergies  Review of patient's allergies indicates no known allergies.  Home Medications   Prior to Admission medications   Medication Sig Start Date End Date Taking? Authorizing Provider  citalopram (CELEXA) 20 MG tablet Take 1 tablet by mouth daily. 07/06/14  Yes Historical Provider, MD  gabapentin (NEURONTIN) 400 MG capsule Take 1 capsule (400 mg total) by mouth 3 (three) times daily. 09/12/13  Yes Cathren Laine, MD  insulin glargine (LANTUS) 100 UNIT/ML injection Inject 50 Units into the skin at bedtime. Takes 40 units of lantus if blood sugar over 200   Yes Historical Provider, MD  lisinopril (PRINIVIL,ZESTRIL) 10 MG tablet Take 10 mg by mouth daily.   Yes Historical Provider, MD  lithium 300 MG tablet Take 1 tablet by mouth 2 (two) times daily.  in the morning, and  at bedtimes 07/06/14  Yes Historical Provider, MD  omeprazole (PRILOSEC) 20 MG capsule Take 40 mg by mouth daily.   Yes Historical Provider, MD  PROAIR HFA 108 (90 BASE) MCG/ACT inhaler Inhale 2 puffs into the lungs every 6 (six) hours as needed. Shortness of breath 07/06/14  Yes Historical Provider, MD  rivaroxaban (XARELTO) 20 MG TABS tablet Take 20 mg by mouth daily with supper.   Yes Historical Provider, MD  SPIRIVA RESPIMAT 2.5 MCG/ACT AERS Inhale 1 puff into the lungs daily. 07/06/14  Yes Historical Provider, MD  traZODone (DESYREL) 100 MG tablet Take 2 tablets by mouth at bedtime. 07/06/14  Yes Historical Provider, MD   BP 152/103 mmHg  Pulse 62  Temp(Src) 99.2 F (37.3 C) (Oral)  Resp 18  SpO2 98% Physical Exam  Constitutional: He appears well-developed.  Cardiovascular:  Mild tachycardia  Pulmonary/Chest: Effort normal.  Abdominal: Soft.  Musculoskeletal: Normal range of motion.  Neurological: He is alert.  Skin: Skin is warm.     ED Course  Procedures (including critical care time) Labs Review Labs Reviewed  ACETAMINOPHEN LEVEL - Abnormal; Notable for the following:    Acetaminophen (Tylenol), Serum <10.0 (*)    All other components within normal limits  CBC - Abnormal; Notable for the following:    Hemoglobin 19.3 (*)    HCT 55.2 (*)    All other components within normal limits  COMPREHENSIVE METABOLIC PANEL - Abnormal; Notable for the following:    Glucose, Bld 207 (*)    AST 56 (*)    ALT 60 (*)    GFR calc non Af Amer 81 (*)    All other components within normal limits  URINE RAPID DRUG SCREEN (HOSP PERFORMED) - Abnormal; Notable for the following:    Cocaine POSITIVE (*)    Benzodiazepines POSITIVE (*)    Tetrahydrocannabinol POSITIVE (*)    All other components within normal limits  LITHIUM LEVEL - Abnormal; Notable for the following:    Lithium Lvl <0.06 (*)    All other components within normal limits  CBG MONITORING, ED - Abnormal; Notable for the following:    Glucose-Capillary 118 (*)    All other components within normal limits  ETHANOL  SALICYLATE LEVEL    Imaging Review No results found.   EKG Interpretation None      MDM   Final diagnoses:  Alcohol dependence with uncomplicated withdrawal  Cocaine abuse  Bipolar affective disorder, currently manic, severe, with psychotic features    Patient with alcohol abuse cocaine abuse and bipolar disorder. Presents for treatment. Medical clearance pending TTS evaluation.    Benjiman CoreNathan Zephaniah Enyeart, MD 09/13/14 [phone]
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
## 2014-09-12 NOTE — ED Notes (Signed)
Pt here with depression. Has been taking lithium x 20 years.  Stopped taking the meds.  Pt seen at crisis center and sent here. Pt states thoughts of hurting self.  Plan: walk in front of car.  No HI.  Hearing voices.

## 2014-09-13 ENCOUNTER — Encounter (HOSPITAL_COMMUNITY): Payer: Self-pay | Admitting: General Practice

## 2014-09-13 ENCOUNTER — Inpatient Hospital Stay (HOSPITAL_COMMUNITY)
Admission: AD | Admit: 2014-09-13 | Discharge: 2014-09-21 | DRG: 885 | Disposition: A | Discharge status: Home / Self Care | Payer: Medicare Other | Source: Intra-hospital | Attending: Psychiatry | Admitting: Psychiatry

## 2014-09-13 DIAGNOSIS — F17210 Nicotine dependence, cigarettes, uncomplicated: Secondary | ICD-10-CM | POA: Diagnosis present

## 2014-09-13 DIAGNOSIS — F315 Bipolar disorder, current episode depressed, severe, with psychotic features: Secondary | ICD-10-CM

## 2014-09-13 DIAGNOSIS — F1410 Cocaine abuse, uncomplicated: Secondary | ICD-10-CM | POA: Diagnosis present

## 2014-09-13 DIAGNOSIS — G894 Chronic pain syndrome: Secondary | ICD-10-CM | POA: Diagnosis present

## 2014-09-13 DIAGNOSIS — F1010 Alcohol abuse, uncomplicated: Secondary | ICD-10-CM | POA: Diagnosis present

## 2014-09-13 DIAGNOSIS — F1910 Other psychoactive substance abuse, uncomplicated: Secondary | ICD-10-CM | POA: Insufficient documentation

## 2014-09-13 DIAGNOSIS — F314 Bipolar disorder, current episode depressed, severe, without psychotic features: Secondary | ICD-10-CM | POA: Diagnosis present

## 2014-09-13 DIAGNOSIS — I10 Essential (primary) hypertension: Secondary | ICD-10-CM | POA: Diagnosis present

## 2014-09-13 DIAGNOSIS — R45851 Suicidal ideations: Secondary | ICD-10-CM

## 2014-09-13 DIAGNOSIS — F10230 Alcohol dependence with withdrawal, uncomplicated: Secondary | ICD-10-CM | POA: Diagnosis present

## 2014-09-13 DIAGNOSIS — E1165 Type 2 diabetes mellitus with hyperglycemia: Secondary | ICD-10-CM

## 2014-09-13 DIAGNOSIS — E119 Type 2 diabetes mellitus without complications: Secondary | ICD-10-CM | POA: Diagnosis present

## 2014-09-13 DIAGNOSIS — Z590 Homelessness: Secondary | ICD-10-CM

## 2014-09-13 DIAGNOSIS — F1110 Opioid abuse, uncomplicated: Secondary | ICD-10-CM | POA: Diagnosis present

## 2014-09-13 DIAGNOSIS — G4700 Insomnia, unspecified: Secondary | ICD-10-CM | POA: Diagnosis present

## 2014-09-13 DIAGNOSIS — F410 Panic disorder [episodic paroxysmal anxiety] without agoraphobia: Secondary | ICD-10-CM | POA: Diagnosis present

## 2014-09-13 DIAGNOSIS — Z794 Long term (current) use of insulin: Secondary | ICD-10-CM | POA: Diagnosis not present

## 2014-09-13 DIAGNOSIS — F10239 Alcohol dependence with withdrawal, unspecified: Secondary | ICD-10-CM | POA: Diagnosis present

## 2014-09-13 DIAGNOSIS — F251 Schizoaffective disorder, depressive type: Secondary | ICD-10-CM | POA: Diagnosis present

## 2014-09-13 DIAGNOSIS — F312 Bipolar disorder, current episode manic severe with psychotic features: Secondary | ICD-10-CM | POA: Diagnosis present

## 2014-09-13 DIAGNOSIS — I82401 Acute embolism and thrombosis of unspecified deep veins of right lower extremity: Secondary | ICD-10-CM

## 2014-09-13 LAB — CBG MONITORING, ED: Glucose-Capillary: 118 mg/dL — ABNORMAL HIGH (ref 70–99)

## 2014-09-13 LAB — GLUCOSE, CAPILLARY: Glucose-Capillary: 152 mg/dL — ABNORMAL HIGH (ref 70–99)

## 2014-09-13 MED ORDER — ONDANSETRON 4 MG PO TBDP
4.0000 mg | ORAL_TABLET | Freq: Four times a day (QID) | ORAL | Status: AC | PRN
  Administered 2014-09-15: 4 mg via ORAL
  Filled 2014-09-13: qty 1

## 2014-09-13 MED ORDER — LORAZEPAM 1 MG PO TABS
1.0000 mg | ORAL_TABLET | Freq: Two times a day (BID) | ORAL | Status: AC
  Administered 2014-09-16 (×2): 1 mg via ORAL
  Filled 2014-09-13 (×2): qty 1

## 2014-09-13 MED ORDER — CLONIDINE HCL 0.1 MG PO TABS
0.1000 mg | ORAL_TABLET | Freq: Every day | ORAL | Status: AC
  Administered 2014-09-18 – 2014-09-19 (×2): 0.1 mg via ORAL
  Filled 2014-09-13 (×2): qty 1

## 2014-09-13 MED ORDER — PANTOPRAZOLE SODIUM 40 MG PO TBEC
40.0000 mg | DELAYED_RELEASE_TABLET | Freq: Every day | ORAL | Status: DC
  Administered 2014-09-14 – 2014-09-21 (×8): 40 mg via ORAL
  Filled 2014-09-13 (×9): qty 1
  Filled 2014-09-13: qty 40
  Filled 2014-09-13: qty 1

## 2014-09-13 MED ORDER — VITAMIN B-1 100 MG PO TABS
100.0000 mg | ORAL_TABLET | Freq: Every day | ORAL | Status: DC
  Administered 2014-09-14 – 2014-09-20 (×7): 100 mg via ORAL
  Filled 2014-09-13 (×9): qty 1

## 2014-09-13 MED ORDER — LORAZEPAM 1 MG PO TABS
2.0000 mg | ORAL_TABLET | ORAL | Status: AC
  Administered 2014-09-13: 2 mg via ORAL
  Filled 2014-09-13: qty 2

## 2014-09-13 MED ORDER — ARIPIPRAZOLE 5 MG PO TABS
5.0000 mg | ORAL_TABLET | Freq: Once | ORAL | Status: AC
  Administered 2014-09-13: 5 mg via ORAL
  Filled 2014-09-13: qty 1

## 2014-09-13 MED ORDER — CITALOPRAM HYDROBROMIDE 20 MG PO TABS
20.0000 mg | ORAL_TABLET | Freq: Every day | ORAL | Status: DC
  Administered 2014-09-14 – 2014-09-20 (×7): 20 mg via ORAL
  Filled 2014-09-13 (×3): qty 1
  Filled 2014-09-13: qty 14
  Filled 2014-09-13 (×6): qty 1

## 2014-09-13 MED ORDER — LITHIUM CARBONATE 300 MG PO CAPS
300.0000 mg | ORAL_CAPSULE | Freq: Every day | ORAL | Status: DC
  Administered 2014-09-14 – 2014-09-20 (×7): 300 mg via ORAL
  Filled 2014-09-13 (×8): qty 1

## 2014-09-13 MED ORDER — NICOTINE 7 MG/24HR TD PT24
7.0000 mg | MEDICATED_PATCH | Freq: Every day | TRANSDERMAL | Status: DC
  Administered 2014-09-13 – 2014-09-20 (×8): 7 mg via TRANSDERMAL
  Filled 2014-09-13 (×11): qty 1

## 2014-09-13 MED ORDER — INSULIN ASPART 100 UNIT/ML ~~LOC~~ SOLN
0.0000 [IU] | Freq: Three times a day (TID) | SUBCUTANEOUS | Status: DC
  Administered 2014-09-14 – 2014-09-16 (×2): 3 [IU] via SUBCUTANEOUS
  Administered 2014-09-17: 5 [IU] via SUBCUTANEOUS
  Administered 2014-09-18 – 2014-09-19 (×3): 2 [IU] via SUBCUTANEOUS
  Administered 2014-09-19 – 2014-09-20 (×2): 3 [IU] via SUBCUTANEOUS
  Administered 2014-09-20: 2 [IU] via SUBCUTANEOUS
  Administered 2014-09-20: 3 [IU] via SUBCUTANEOUS

## 2014-09-13 MED ORDER — CLONIDINE HCL 0.2 MG PO TABS
0.2000 mg | ORAL_TABLET | Freq: Once | ORAL | Status: AC
  Administered 2014-09-13: 0.2 mg via ORAL
  Filled 2014-09-13: qty 2
  Filled 2014-09-13: qty 1

## 2014-09-13 MED ORDER — LORAZEPAM 1 MG PO TABS
1.0000 mg | ORAL_TABLET | Freq: Every day | ORAL | Status: AC
Start: 2014-09-17 — End: 2014-09-17
  Administered 2014-09-17: 1 mg via ORAL
  Filled 2014-09-13: qty 1

## 2014-09-13 MED ORDER — THIAMINE HCL 100 MG/ML IJ SOLN
100.0000 mg | Freq: Once | INTRAMUSCULAR | Status: AC
  Administered 2014-09-13: 100 mg via INTRAMUSCULAR
  Filled 2014-09-13: qty 2

## 2014-09-13 MED ORDER — CLONIDINE HCL 0.1 MG PO TABS
0.1000 mg | ORAL_TABLET | ORAL | Status: AC
  Administered 2014-09-16 – 2014-09-17 (×4): 0.1 mg via ORAL
  Filled 2014-09-13 (×4): qty 1

## 2014-09-13 MED ORDER — LITHIUM CARBONATE 300 MG PO CAPS
600.0000 mg | ORAL_CAPSULE | Freq: Every day | ORAL | Status: DC
  Administered 2014-09-13 – 2014-09-19 (×7): 600 mg via ORAL
  Filled 2014-09-13 (×8): qty 2

## 2014-09-13 MED ORDER — RIVAROXABAN 20 MG PO TABS
20.0000 mg | ORAL_TABLET | Freq: Every day | ORAL | Status: DC
  Administered 2014-09-13 – 2014-09-20 (×8): 20 mg via ORAL
  Filled 2014-09-13 (×3): qty 1
  Filled 2014-09-13: qty 14
  Filled 2014-09-13 (×6): qty 1

## 2014-09-13 MED ORDER — LORAZEPAM 1 MG PO TABS
1.0000 mg | ORAL_TABLET | Freq: Four times a day (QID) | ORAL | Status: AC | PRN
  Administered 2014-09-15: 1 mg via ORAL
  Filled 2014-09-13: qty 1

## 2014-09-13 MED ORDER — INSULIN GLARGINE 100 UNIT/ML ~~LOC~~ SOLN: 50.0000 [IU] | Freq: Every day | SUBCUTANEOUS | Status: DC

## 2014-09-13 MED ORDER — CLONIDINE HCL 0.1 MG PO TABS
0.1000 mg | ORAL_TABLET | Freq: Four times a day (QID) | ORAL | Status: AC
  Administered 2014-09-13 – 2014-09-15 (×8): 0.1 mg via ORAL
  Filled 2014-09-13 (×12): qty 1

## 2014-09-13 MED ORDER — GABAPENTIN 300 MG PO CAPS: 300.0000 mg | ORAL_CAPSULE | Freq: Three times a day (TID) | ORAL | Status: DC

## 2014-09-13 MED ORDER — LORAZEPAM 1 MG PO TABS
1.0000 mg | ORAL_TABLET | Freq: Three times a day (TID) | ORAL | Status: AC
  Administered 2014-09-15 (×3): 1 mg via ORAL
  Filled 2014-09-13 (×3): qty 1

## 2014-09-13 MED ORDER — METHOCARBAMOL 500 MG PO TABS
500.0000 mg | ORAL_TABLET | Freq: Three times a day (TID) | ORAL | Status: AC | PRN
  Administered 2014-09-14 – 2014-09-18 (×6): 500 mg via ORAL
  Filled 2014-09-13 (×7): qty 1

## 2014-09-13 MED ORDER — NAPROXEN 500 MG PO TABS
500.0000 mg | ORAL_TABLET | Freq: Two times a day (BID) | ORAL | Status: AC | PRN
  Administered 2014-09-14 – 2014-09-18 (×3): 500 mg via ORAL
  Filled 2014-09-13 (×4): qty 1

## 2014-09-13 MED ORDER — LISINOPRIL 10 MG PO TABS
10.0000 mg | ORAL_TABLET | Freq: Every day | ORAL | Status: DC
  Administered 2014-09-14 – 2014-09-18 (×5): 10 mg via ORAL
  Filled 2014-09-13 (×7): qty 1

## 2014-09-13 MED ORDER — GABAPENTIN 300 MG PO CAPS
300.0000 mg | ORAL_CAPSULE | Freq: Three times a day (TID) | ORAL | Status: DC
  Administered 2014-09-13 – 2014-09-15 (×5): 300 mg via ORAL
  Filled 2014-09-13 (×12): qty 1

## 2014-09-13 MED ORDER — ARIPIPRAZOLE 5 MG PO TABS
5.0000 mg | ORAL_TABLET | Freq: Every day | ORAL | Status: DC
  Administered 2014-09-14 – 2014-09-15 (×2): 5 mg via ORAL
  Filled 2014-09-13 (×4): qty 1

## 2014-09-13 MED ORDER — ACETAMINOPHEN 325 MG PO TABS
650.0000 mg | ORAL_TABLET | Freq: Four times a day (QID) | ORAL | Status: DC | PRN
  Administered 2014-09-18 – 2014-09-19 (×2): 650 mg via ORAL
  Filled 2014-09-13 (×2): qty 2

## 2014-09-13 MED ORDER — ADULT MULTIVITAMIN W/MINERALS CH
1.0000 | ORAL_TABLET | Freq: Every day | ORAL | Status: DC
  Administered 2014-09-13 – 2014-09-20 (×8): 1 via ORAL
  Filled 2014-09-13 (×11): qty 1

## 2014-09-13 MED ORDER — LOPERAMIDE HCL 2 MG PO CAPS: 2.0000 mg | ORAL_CAPSULE | ORAL | Status: AC | PRN

## 2014-09-13 MED ORDER — ALUM & MAG HYDROXIDE-SIMETH 200-200-20 MG/5ML PO SUSP: 30.0000 mL | ORAL | Status: DC | PRN

## 2014-09-13 MED ORDER — MAGNESIUM HYDROXIDE 400 MG/5ML PO SUSP
30.0000 mL | Freq: Every day | ORAL | Status: DC | PRN
  Administered 2014-09-15 – 2014-09-18 (×2): 30 mL via ORAL
  Filled 2014-09-13 (×2): qty 30

## 2014-09-13 MED ORDER — DICYCLOMINE HCL 20 MG PO TABS
20.0000 mg | ORAL_TABLET | Freq: Four times a day (QID) | ORAL | Status: AC | PRN
  Administered 2014-09-14: 20 mg via ORAL
  Filled 2014-09-13: qty 1

## 2014-09-13 MED ORDER — LORAZEPAM 1 MG PO TABS
1.0000 mg | ORAL_TABLET | Freq: Four times a day (QID) | ORAL | Status: AC
  Administered 2014-09-13 – 2014-09-14 (×4): 1 mg via ORAL
  Filled 2014-09-13 (×5): qty 1

## 2014-09-13 MED ORDER — ALBUTEROL SULFATE HFA 108 (90 BASE) MCG/ACT IN AERS
2.0000 | INHALATION_SPRAY | Freq: Four times a day (QID) | RESPIRATORY_TRACT | Status: DC | PRN
  Administered 2014-09-13: 2 via RESPIRATORY_TRACT

## 2014-09-13 MED ORDER — HYDROXYZINE HCL 25 MG PO TABS
25.0000 mg | ORAL_TABLET | Freq: Four times a day (QID) | ORAL | Status: AC | PRN
  Administered 2014-09-17: 25 mg via ORAL
  Filled 2014-09-13: qty 1

## 2014-09-13 MED ORDER — INSULIN GLARGINE 100 UNIT/ML ~~LOC~~ SOLN
30.0000 [IU] | Freq: Every day | SUBCUTANEOUS | Status: DC
  Administered 2014-09-13 – 2014-09-20 (×7): 30 [IU] via SUBCUTANEOUS
  Filled 2014-09-13: qty 10

## 2014-09-13 NOTE — Progress Notes (Signed)
Adult Psychoeducational Group Note  Date:  09/13/2014 Time:  8:00 pm  Group Topic/Focus:  Wrap-Up Group:   The focus of this group is to help patients review their daily goal of treatment and discuss progress on daily workbooks.  Participation Level:  Did Not Attend   Modena NunneryJohnson, Gilma Bessette 09/13/2014, 11:52 PM

## 2014-09-13 NOTE — ED Notes (Signed)
Pt ate lunch, [facility] transportation called for [facility]

## 2014-09-13 NOTE — Progress Notes (Signed)
Patient ID: Billy [name], male   DOB: 1960-08-18, 54 y.o.   MRN: [id number] PER STATE REGULATIONS 482.30  THIS CHART WAS REVIEWED FOR MEDICAL NECESSITY WITH RESPECT TO THE PATIENT'S ADMISSION/DURATION OF STAY.  NEXT REVIEW DATE:09/17/14  Loura HaltBARBARA Kushal Saunders, RN, BSN CASE MANAGER
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
## 2014-09-13 NOTE — ED Notes (Signed)
[facility], notified Geologist, engineeringunit secretary Tonya [facility] is [facility]

## 2014-09-13 NOTE — Tx Team (Signed)
Initial Interdisciplinary Treatment Plan   PATIENT STRESSORS: Health problems Medication change or noncompliance Substance abuse   PATIENT STRENGTHS: Average or above average intelligence General fund of knowledge   PROBLEM LIST: Problem List/Patient Goals Date to be addressed Date deferred Reason deferred Estimated date of resolution  Depression 09/13/2014           "getting meds straight" 09/13/2014           "Housing" 09/13/2014                              DISCHARGE CRITERIA:  Improved stabilization in mood, thinking, and/or behavior Verbal commitment to aftercare and medication compliance  PRELIMINARY DISCHARGE PLAN: Attend PHP/IOP Outpatient therapy  PATIENT/FAMIILY INVOLVEMENT: This treatment plan has been presented to and reviewed with the patient, Billy [name].  The patient and family have been given the opportunity to ask questions and make suggestions.  Marzetta BoardDopson, Caylie Sandquist E 09/13/2014, 4:28 PM

## 2014-09-13 NOTE — Progress Notes (Signed)
D: Pt is currently negative for any SI/HI/AVH. Pt is denying any current withdrawal symptoms. Pt had no concerns he wished for this writer to address at this time. Pt remained in his bed asleep for the majority of the evening.   A: Writer administered scheduled medications to pt, per MD orders. Continued support and availability as needed was extended to this pt. Staff continue to monitor pt with q3615min checks.  R: No adverse drug reactions noted. Pt receptive to treatment. Pt remains safe at this time.

## 2014-09-13 NOTE — BH Assessment (Signed)
[facility] by Dr. Jannifer FranklinAkintayo and Nanine MeansJamison Lord, NP. Nursing report [phone]. Room assignment is 304-1. Support paperwork completed.

## 2014-09-13 NOTE — Consult Note (Signed)
[place] [facility] Consult   Reason for Consult:  Hallucinations, suicidal ideations Referring Physician:  EDP Patient Identification: Billy [name] MRN:  [id number] Principal Diagnosis: Bipolar Affective Disorder, most recent episode depressed, severe with psychotic features Diagnosis:   Patient Active Problem List   Diagnosis Date Noted  . Schizoaffective disorder, depressive type with good prognostic features [F25.1] 09/13/2014    Priority: High  . Alcohol dependence with uncomplicated withdrawal [G99.242] 09/13/2014    Priority: High  . Cocaine abuse [F14.10] 09/13/2014    Priority: High  . Alcohol abuse [F10.10] 10/12/2012  . Homeless [Z59.0] 09/02/2011  . Opiate abuse, episodic [F11.10] 09/02/2011  . Opiate use [F11.90] 08/30/2011  . Diabetes mellitus [250] 08/30/2011  . WPW (Wolff-Parkinson-White syndrome) [I45.6] 08/30/2011    Total Time spent with patient: 45 minutes  Subjective:   Billy [name] is a 54 y.o. male patient admitted with hallucinations and suicidal ideations.  HPI:  The patient has been off of his Lithium for a couple of weeks because he said it was not working.  His depression increased until he was having suicidal ideations with a plan to walk into traffic along with auditory hallucinations.  Billy [name] has attempted suicide five times in the past but has not been hospitalized in a "couple of years."  Denies alcohol abuse, homicidal ideations. Billy [name] does abuse cocaine at times, recent use. He is a patient at [facility] but has not been there recently.   HPI Elements:   Location:  generalzied. Quality:  acute. Severity:  severe. Timing:  constant. Duration:  few days. Context:  not taking his medications.  Past Medical History:  Past Medical History  Diagnosis Date  . Diabetes mellitus   . Hepatitis C carrier   . WPW (Wolff-Parkinson-White syndrome)   . Depression   . Chronic pain   . Suicide attempt   . Diabetes mellitus without  complication   . Hypertension   . Bipolar 1 disorder   . HCV (hepatitis C virus)   . Homeless     Past Surgical History  Procedure Laterality Date  . Rhinoplasty    . Chest surgery    . Back surgery    . Tonsillectomy     Family History: History reviewed. No pertinent family history. Social History:  History  Alcohol Use  . Yes    Comment: heavy     History  Drug Use  . Yes  . Special: Marijuana, "Crack" cocaine    History   Social History  . Marital Status: Divorced    Spouse Name: N/A  . Number of Children: N/A  . Years of Education: N/A   Social History Main Topics  . Smoking status: Current Every Day Smoker -- 0.25 packs/day for 8 years    Types: Cigarettes  . Smokeless tobacco: Not on file  . Alcohol Use: Yes     Comment: heavy  . Drug Use: Yes    Special: Marijuana, "Crack" cocaine  . Sexual Activity: Not on file   Other Topics Concern  . None   Social History Narrative   ** Merged History Encounter **       Additional Social History:    Pain Medications: See MAR  Prescriptions: See MAR  Over the Counter: See MAR  History of alcohol / drug use?: Yes Longest period of sobriety (when/how long): None  Negative Consequences of Use: Work / Youth worker, Charity fundraiser relationships, Museum/gallery curator Withdrawal Symptoms: Other (Comment) (No w/d sxs ) Name of Substance 1: Crack Cocaine  1 - Age of First Use: 20's  1 - Amount (size/oz): $20 1 - Frequency: Weekly  1 - Duration: On-going  1 - Last Use / Amount: 09/07/14 Name of Substance 2: THC  2 - Age of First Use: Teens  2 - Amount (size/oz): < 1 gram  2 - Frequency: 3-4x's Wkly  2 - Duration: On-going  2 - Last Use / Amount: 09/12/14                 Allergies:  No Known Allergies  Labs:  Results for orders placed or performed during the hospital encounter of 09/12/14 (from the past 48 hour(s))  Urine Drug Screen     Status: Abnormal   Collection Time: 09/12/14  5:21 PM  Result Value Ref Range    Opiates NONE DETECTED NONE DETECTED   Cocaine POSITIVE (A) NONE DETECTED   Benzodiazepines POSITIVE (A) NONE DETECTED   Amphetamines NONE DETECTED NONE DETECTED   Tetrahydrocannabinol POSITIVE (A) NONE DETECTED   Barbiturates NONE DETECTED NONE DETECTED    Comment:        DRUG SCREEN FOR MEDICAL PURPOSES ONLY.  IF CONFIRMATION IS NEEDED FOR ANY PURPOSE, NOTIFY LAB WITHIN 5 DAYS.        LOWEST DETECTABLE LIMITS FOR URINE DRUG SCREEN Drug Class       Cutoff (ng/mL) Amphetamine      1000 Barbiturate      200 Benzodiazepine   182 Tricyclics       993 Opiates          300 Cocaine          300 THC              50   Acetaminophen level     Status: Abnormal   Collection Time: 09/12/14  5:54 PM  Result Value Ref Range   Acetaminophen (Tylenol), Serum <10.0 (L) 10 - 30 ug/mL    Comment:        THERAPEUTIC CONCENTRATIONS VARY SIGNIFICANTLY. A RANGE OF 10-30 ug/mL MAY BE AN EFFECTIVE CONCENTRATION FOR MANY PATIENTS. HOWEVER, SOME ARE BEST TREATED AT CONCENTRATIONS OUTSIDE THIS RANGE. ACETAMINOPHEN CONCENTRATIONS >150 ug/mL AT 4 HOURS AFTER INGESTION AND >50 ug/mL AT 12 HOURS AFTER INGESTION ARE OFTEN ASSOCIATED WITH TOXIC REACTIONS.   CBC     Status: Abnormal   Collection Time: 09/12/14  5:54 PM  Result Value Ref Range   WBC 9.3 4.0 - 10.5 K/uL   RBC 5.70 4.22 - 5.81 MIL/uL   Hemoglobin 19.3 (H) 13.0 - 17.0 g/dL   HCT 55.2 (H) 39.0 - 52.0 %   MCV 96.8 78.0 - 100.0 fL   MCH 33.9 26.0 - 34.0 pg   MCHC 35.0 30.0 - 36.0 g/dL   RDW 13.8 11.5 - 15.5 %   Platelets 150 150 - 400 K/uL  Comprehensive metabolic panel     Status: Abnormal   Collection Time: 09/12/14  5:54 PM  Result Value Ref Range   Sodium 137 135 - 145 mmol/L   Potassium 4.0 3.5 - 5.1 mmol/L   Chloride 104 96 - 112 mmol/L   CO2 21 19 - 32 mmol/L   Glucose, Bld 207 (H) 70 - 99 mg/dL   BUN 9 6 - 23 mg/dL   Creatinine, Ser 1.04 0.50 - 1.35 mg/dL   Calcium 9.3 8.4 - 10.5 mg/dL   Total Protein 8.3 6.0 - 8.3 g/dL    Albumin 4.4 3.5 - 5.2 g/dL   AST 56 (H) 0 -  37 U/L   ALT 60 (H) 0 - 53 U/L   Alkaline Phosphatase 57 39 - 117 U/L   Total Bilirubin 1.2 0.3 - 1.2 mg/dL   GFR calc non Af Amer 81 (L) >90 mL/min   GFR calc Af Amer >90 >90 mL/min    Comment: (NOTE) The eGFR has been calculated using the CKD EPI equation. This calculation has not been validated in all clinical situations. eGFR's persistently <90 mL/min signify possible Chronic Kidney Disease.    Anion gap 12 5 - 15  Ethanol (ETOH)     Status: None   Collection Time: 09/12/14  5:54 PM  Result Value Ref Range   Alcohol, Ethyl (B) <5 0 - 9 mg/dL    Comment:        LOWEST DETECTABLE LIMIT FOR SERUM ALCOHOL IS 11 mg/dL FOR MEDICAL PURPOSES ONLY   Salicylate level     Status: None   Collection Time: 09/12/14  5:54 PM  Result Value Ref Range   Salicylate Lvl <4.0 2.8 - 20.0 mg/dL  Lithium level     Status: Abnormal   Collection Time: 09/12/14  5:58 PM  Result Value Ref Range   Lithium Lvl <0.06 (L) 0.80 - 1.40 mmol/L    Vitals: Blood pressure 142/83, pulse 64, temperature 97.8 F (36.6 C), temperature source Oral, resp. rate 18, SpO2 98 %.  Risk to Self: Suicidal Ideation: Yes-Currently Present Suicidal Intent: Yes-Currently Present Is patient at risk for suicide?: Yes Suicidal Plan?: Yes-Currently Present Specify Current Suicidal Plan: Jump in front of a vehicle  Access to Means: Yes Specify Access to Suicidal Means: Traffic  What has been your use of drugs/alcohol within the last 12 months?: Abusing: cocaine, thc  How many times?: 5 Other Self Harm Risks: None  Triggers for Past Attempts: Family contact, Other personal contacts, Unpredictable Intentional Self Injurious Behavior: None Risk to Others: Homicidal Ideation: No Thoughts of Harm to Others: No Current Homicidal Intent: No Current Homicidal Plan: No Access to Homicidal Means: No Identified Victim: None  History of harm to others?: No Assessment of Violence:  None Noted Violent Behavior Description: None  Does patient have access to weapons?: No Criminal Charges Pending?: No Does patient have a court date: No Prior Inpatient Therapy: Prior Inpatient Therapy: Yes Prior Therapy Dates: 2010,2013,2014 Prior Therapy Facilty/Provider(s): [facility]  Reason for Treatment: SI/SA/depression  Prior Outpatient Therapy: Prior Outpatient Therapy: No Prior Therapy Dates: None  Prior Therapy Facilty/Provider(s): None  Reason for Treatment: None   Current Facility-Administered Medications  Medication Dose Route Frequency Provider Last Rate Last Dose  . acetaminophen (TYLENOL) tablet 650 mg  650 mg Oral Q4H PRN Davonna Belling, MD   650 mg at 09/12/14 2253  . albuterol (PROVENTIL HFA;VENTOLIN HFA) 108 (90 BASE) MCG/ACT inhaler 2 puff  2 puff Inhalation Q6H PRN Davonna Belling, MD      . alum & mag hydroxide-simeth (MAALOX/MYLANTA) 200-200-20 MG/5ML suspension 30 mL  30 mL Oral PRN Davonna Belling, MD      . ARIPiprazole (ABILIFY) tablet 5 mg  5 mg Oral Once Tyaira Heward      . citalopram (CELEXA) tablet 20 mg  20 mg Oral Daily Davonna Belling, MD   20 mg at 09/13/14 0926  . gabapentin (NEURONTIN) capsule 300 mg  300 mg Oral TID Ledia Hanford      . insulin glargine (LANTUS) injection 50 Units  50 Units Subcutaneous QHS Davonna Belling, MD   50 Units at 09/12/14 2251  .  lisinopril (PRINIVIL,ZESTRIL) tablet 10 mg  10 mg Oral Daily Davonna Belling, MD   10 mg at 09/13/14 0926  . lithium carbonate capsule 300 mg  300 mg Oral Daily Davonna Belling, MD   300 mg at 09/13/14 0926  . lithium carbonate capsule 600 mg  600 mg Oral QHS Davonna Belling, MD   600 mg at 09/12/14 2254  . LORazepam (ATIVAN) tablet 1 mg  1 mg Oral Q8H PRN Davonna Belling, MD   1 mg at 09/12/14 2253  . pantoprazole (PROTONIX) EC tablet 40 mg  40 mg Oral Daily Davonna Belling, MD   40 mg at 09/13/14 0926  . rivaroxaban (XARELTO) tablet 20 mg  20 mg Oral Q supper Davonna Belling, MD   20 mg at 09/12/14 2253   Current Outpatient Prescriptions  Medication Sig Dispense Refill  . citalopram (CELEXA) 20 MG tablet Take 1 tablet by mouth daily.  1  . gabapentin (NEURONTIN) 400 MG capsule Take 1 capsule (400 mg total) by mouth 3 (three) times daily. 90 capsule 0  . insulin glargine (LANTUS) 100 UNIT/ML injection Inject 50 Units into the skin at bedtime. Takes 40 units of lantus if blood sugar over 200    . lisinopril (PRINIVIL,ZESTRIL) 10 MG tablet Take 10 mg by mouth daily.    Marland Kitchen lithium 300 MG tablet Take 1 tablet by mouth 2 (two) times daily. 370m in the morning, and 6048mat bedtimes  0  . omeprazole (PRILOSEC) 20 MG capsule Take 40 mg by mouth daily.    . Marland KitchenxyCODONE-acetaminophen (PERCOCET/ROXICET) 5-325 MG per tablet Take 1 tablet by mouth every 4 (four) hours as needed for moderate pain or severe pain.    . Marland KitchenROAIR HFA 108 (90 BASE) MCG/ACT inhaler Inhale 2 puffs into the lungs every 6 (six) hours as needed. Shortness of breath  5  . rivaroxaban (XARELTO) 20 MG TABS tablet Take 20 mg by mouth daily with supper.    . Marland KitchenPIRIVA RESPIMAT 2.5 MCG/ACT AERS Inhale 1 puff into the lungs daily.  5  . tiZANidine (ZANAFLEX) 4 MG tablet Take 4 mg by mouth 3 (three) times daily.    . traZODone (DESYREL) 100 MG tablet Take 2 tablets by mouth at bedtime.  1  . dextromethorphan 15 MG/5ML syrup Take 10 mLs (30 mg total) by mouth 4 (four) times daily as needed for cough. (Patient not taking: Reported on 09/12/2014) 120 mL 0  . traMADol (ULTRAM) 50 MG tablet Take 1 tablet (50 mg total) by mouth every 6 (six) hours as needed. (Patient not taking: Reported on 09/12/2014) 15 tablet 0    Musculoskeletal: Strength & Muscle Tone: within normal limits Gait & Station: normal Patient leans: N/A  Psychiatric Specialty Exam:     Blood pressure 142/83, pulse 64, temperature 97.8 F (36.6 C), temperature source Oral, resp. rate 18, SpO2 98 %.There is no weight on file to calculate BMI.   General Appearance: Disheveled  Eye CoSport and exercise psychologist  Fair  Speech:  Normal Rate  Volume:  Normal  Mood:  Depressed  Affect:  Congruent  Thought Process:  Coherent  Orientation:  Full (Time, Place, and Person)  Thought Content:  Hallucinations: Auditory  Suicidal Thoughts:  Yes.  with intent/plan  Homicidal Thoughts:  No  Memory:  Immediate;   Fair Recent;   Fair Remote;   Fair  Judgement:  Poor  Insight:  Fair  Psychomotor Activity:  Decreased  Concentration:  Fair  Recall:  [employer]  Fair  Akathisia:  No  Handed:  Right  AIMS (if indicated):     Assets:  Leisure Time Physical Health Resilience  ADL's:  Intact  Cognition: WNL  Sleep:      Medical Decision Making: Review of Psycho-Social Stressors (1), Review or order clinical lab tests (1) and Review of Medication Regimen & Side Effects (2)  Treatment Plan Summary: Daily contact with patient to assess and evaluate symptoms and progress in treatment, Medication management and Plan Admit to [facility] for stabilization  Plan:  Recommend psychiatric Inpatient admission when medically cleared. Disposition: Billy [name], PMH-NP 09/13/2014 11:23 AM Patient seen face-to-face for psychiatric evaluation, chart reviewed and case discussed with the physician extender and developed treatment plan. Reviewed the information documented and agree with the treatment plan. Corena Pilgrim, MD

## 2014-09-14 DIAGNOSIS — G894 Chronic pain syndrome: Secondary | ICD-10-CM | POA: Diagnosis present

## 2014-09-14 DIAGNOSIS — F315 Bipolar disorder, current episode depressed, severe, with psychotic features: Secondary | ICD-10-CM | POA: Diagnosis present

## 2014-09-14 LAB — GLUCOSE, CAPILLARY
GLUCOSE-CAPILLARY: 120 mg/dL — AB (ref 70–99)
GLUCOSE-CAPILLARY: 97 mg/dL (ref 70–99)
Glucose-Capillary: 178 mg/dL — ABNORMAL HIGH (ref 70–99)
Glucose-Capillary: 104 mg/dL — ABNORMAL HIGH (ref 70–99)

## 2014-09-14 MED ORDER — TIOTROPIUM BROMIDE MONOHYDRATE 2.5 MCG/ACT IN AERS: 1.0000 | INHALATION_SPRAY | Freq: Every day | RESPIRATORY_TRACT | Status: DC

## 2014-09-14 MED ORDER — ENSURE ENLIVE PO LIQD: 237.0000 mL | ORAL | Status: DC | PRN

## 2014-09-14 MED ORDER — TIOTROPIUM BROMIDE MONOHYDRATE 18 MCG IN CAPS
18.0000 ug | ORAL_CAPSULE | Freq: Every day | RESPIRATORY_TRACT | Status: DC
  Administered 2014-09-14 – 2014-09-20 (×7): 18 ug via RESPIRATORY_TRACT
  Filled 2014-09-14: qty 5

## 2014-09-14 MED ORDER — TIZANIDINE HCL 4 MG PO TABS
4.0000 mg | ORAL_TABLET | Freq: Three times a day (TID) | ORAL | Status: DC
  Administered 2014-09-14 – 2014-09-21 (×20): 4 mg via ORAL
  Filled 2014-09-14 (×6): qty 1
  Filled 2014-09-14: qty 42
  Filled 2014-09-14 (×2): qty 1
  Filled 2014-09-14: qty 42
  Filled 2014-09-14 (×8): qty 1
  Filled 2014-09-14: qty 2
  Filled 2014-09-14 (×6): qty 1
  Filled 2014-09-14: qty 42
  Filled 2014-09-14: qty 1

## 2014-09-14 MED ORDER — ALBUTEROL SULFATE HFA 108 (90 BASE) MCG/ACT IN AERS
2.0000 | INHALATION_SPRAY | Freq: Four times a day (QID) | RESPIRATORY_TRACT | Status: DC | PRN
  Administered 2014-09-16 – 2014-09-20 (×9): 2 via RESPIRATORY_TRACT

## 2014-09-14 NOTE — [facility] Group Notes (Signed)
[facility] LCSW Group Therapy  09/14/2014 2:52 PM  Type of Therapy:  Group Therapy  Participation Level:  Did Not Attend -pt invited-sleeping in room/chose not to attend.   Summary of Progress/Problems: Today's Topic: Overcoming Obstacles. Pt identified obstacles faced currently and processed barriers involved in overcoming these obstacles. Pt identified steps necessary for overcoming these obstacles and explored motivation (internal and external) for facing these difficulties head on. Pt further identified one area of concern in their lives and chose a skill of focus pulled from their "toolbox."   Smart, [place] LCSWA  09/14/2014, 2:52 PM
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
## 2014-09-14 NOTE — Progress Notes (Signed)
Patient ID: Maudie FlakesMelvin K Arcand, male   DOB: Aug 03, 1960, 54 y.o.   MRN: [id number] Adult Psychoeducational Group Note  Date:  09/14/2014 Time: 09:15am  Group Topic/Focus:  Orientation:   The focus of this group is to educate the patient on the purpose and policies of crisis stabilization and provide a format to answer questions about their admission.  The group details unit policies and expectations of patients while admitted. Self Esteem Action Plan:   The focus of this group is to help patients create a plan to continue to build self-esteem after discharge.  Participation Level:  Did Not Attend  Participation Quality: n/a  Affect: n/a  Cognitive: n/a  Insight: n/a  Engagement in Group: n/a  Modes of Intervention:  Activity, Discussion, Education and Orientation  Additional Comments:  Pt did not attend group, pt in bed asleep.  Aurora Maskwyman, Dixon Luczak E 09/14/2014, 10:26 AM

## 2014-09-14 NOTE — Progress Notes (Signed)
NUTRITION ASSESSMENT  Pt identified as at risk on the Malnutrition Screen Tool  INTERVENTION: 1. Educated patient on the importance of nutrition and encouraged intake of food and beverages. 2. Discussed weight goals. 3. Supplements: Ensure Enlive po PRN, each supplement provides 350 kcal and 20 grams of protein  NUTRITION DIAGNOSIS: Unintentional weight loss related to sub-optimal intake as evidenced by pt report.   Goal: Pt to meet >/= 90% of their estimated nutrition needs.  Monitor:  PO intake  Assessment:  Pt reports a 100 lb wt loss in unknown time frame. Per RN, this weight loss possibly was intentional. Pt reports that his intake has been poor since admission. He reports skipping breakfast. Will order nutritional supplements as needed.  54 y.o. male  Height: Ht Readings from Last 1 Encounters:  09/13/14 5\' 11"  (1.803 m)    Weight: Wt Readings from Last 1 Encounters:  09/13/14 198 lb (89.812 kg)    Weight Hx: Wt Readings from Last 10 Encounters:  09/13/14 198 lb (89.812 kg)  01/23/14 230 lb (104.327 kg)  10/26/13 233 lb (105.688 kg)  09/12/13 245 lb 6.4 oz (111.313 kg)  10/19/12 212 lb (96.163 kg)  10/12/12 214 lb (97.07 kg)  08/30/11 259 lb (117.482 kg)    BMI:  Body mass index is 27.63 kg/(m^2). Pt meets criteria for overweight based on current BMI.  Estimated Nutritional Needs: Kcal: 25-30 kcal/kg Protein: > 1 gram protein/kg Fluid: 1 ml/kcal  Diet Order: Diet Heart Room service appropriate?: Yes; Fluid consistency:: Thin Pt is also offered choice of unit snacks mid-morning and mid-afternoon.  Pt is eating as desired.   Lab results and medications reviewed.   Emmaline KluverHaley Tao Satz MS, RD, LDN [phone]

## 2014-09-14 NOTE — Progress Notes (Signed)
Recreation Therapy Notes  Animal-Assisted Activity (AAA) Program Checklist/Progress Notes Patient Eligibility Criteria Checklist & Daily Group note for Rec Tx Intervention  Date: 04.14.2016 Time: 2:45pm Location: [address] Dayroom   AAA/T Program Assumption of Risk Form signed by Patient/ or Parent Legal Guardian yes  Patient is free of allergies or sever asthma yes  Patient reports no fear of animals yes  Patient reports no history of cruelty to animals yes  Patient understands his/her participation is voluntary yes  Behavioral Response: Did not attend.   Sheranda Seabrooks L Khaniyah Bezek, LRT/CTRS  Clarke Amburn L 09/14/2014 4:51 PM 

## 2014-09-14 NOTE — Progress Notes (Signed)
Patient ID: Billy [name], male   DOB: 1960/06/28, 54 y.o.   MRN: [id number]  Pt currently presents with a flat affect and anxious behavior. Pt frequently asks for medications. Pt preoccupied and anxious about starting a pain medication. Pt states "I am taking Xarelto and I don't want to take Tylenol or Ibuprofen. I don't want to have a bleeding episode like before." Pt asks for "Percocet" and states "there is a hydrocodone that doesn't have Tylenol in it that I can take." Pt increasingly irritable throughout the day.  Pt provided with medications per providers orders. Pt's labs and vitals were monitored throughout the day. Pt supported emotionally and encouraged to express concerns and questions. Pt educated on alcohol withdrawal. Provider notified of pts request for pain medication for back pain.  Pt's safety ensured with 15 minute and environmental checks. Pt currently denies SI/HI and A/V hallucinations. Pt verbally agrees to seek staff if SI/HI or A/VH occurs and to consult with staff before acting on these thoughts. Pt upset because "my triaxone hasn't been started." Pt irritable, labile and refuses medications.

## 2014-09-14 NOTE — [facility] Suicide Risk Assessment (Signed)
[facility]   Nursing information obtained from:  Patient Demographic factors:  Male, Caucasian, Unemployed, Low socioeconomic status Current Mental Status:  NA Loss Factors:  Decline in physical health Historical Factors:  Prior suicide attempts, Impulsivity Risk Reduction Factors:   (sense of humor ) Total Time spent with patient: 45 minutes Principal Problem: Bipolar affective disorder, depressed, severe, with psychotic behavior Diagnosis:   Patient Active Problem List   Diagnosis Date Noted  . Bipolar affective disorder, depressed, severe, with psychotic behavior [F31.5] 09/14/2014    Priority: High  . Chronic pain syndrome [G89.4] 09/14/2014    Priority: High  . Cocaine abuse [F14.10] 09/13/2014    Priority: High  . Alcohol abuse [F10.10] 10/12/2012    Priority: High  . Opiate abuse, episodic [F11.10] 09/02/2011    Priority: High  . Alcohol dependence with uncomplicated withdrawal [F10.230] 09/13/2014  . Homeless [Z59.0] 09/02/2011  . Diabetes mellitus [250] 08/30/2011  . WPW (Wolff-Parkinson-White syndrome) [I45.6] 08/30/2011     Continued Clinical Symptoms:    The "Alcohol Use Disorders Identification Test", Guidelines for Use in Primary Care, Second Edition.  World Science [employer]Health [employer] [facility](WHO). Score between 0-7:  no or low risk or alcohol related problems. Score between 8-15:  moderate risk of alcohol related problems. Score between 16-19:  high risk of alcohol related problems. Score 20 or above:  warrants further diagnostic evaluation for alcohol dependence and treatment.   CLINICAL FACTORS:   Bipolar Disorder:   Depressive phase Alcohol/Substance Abuse/Dependencies Currently Psychotic  Psychiatric Specialty Exam: Physical Exam  ROS  Blood pressure 127/65, pulse 80, temperature 98.7 F (37.1 C), temperature source Oral, resp. rate 16, height 5\' 11"  (1.803 m), weight 89.812 kg (198 lb), SpO2 97 %.Body mass index is 27.63 kg/(m^2).     COGNITIVE FEATURES THAT CONTRIBUTE TO RISK:  Closed-mindedness, Polarized thinking and Thought constriction (tunnel vision)    SUICIDE RISK:   Moderate:  Frequent suicidal ideation with limited intensity, and duration, some specificity in terms of plans, no associated intent, good self-control, limited dysphoria/symptomatology, some risk factors present, and identifiable protective factors, including available and accessible social support.  PLAN OF CARE: Supportive approach/coping skills                               Polysubstance Dependence: detox as needed                               Reassess and address the co morbidities                               Chronic pain: improve pain management                               Diabetes: optimize glucose control  Medical Decision Making:  Review of Psycho-Social Stressors (1), Review or order clinical lab tests (1), Review of Medication Regimen & Side Effects (2) and Review of New Medication or Change in Dosage (2)  I certify that inpatient services furnished can reasonably be expected to improve the patient's condition.   Jailine Lieder A 09/14/2014, 1:09 PM

## 2014-09-14 NOTE — Progress Notes (Signed)
Patient did not attend the evening karaoke group. Pt was notified that group was beginning but pt remained on unit in bed.    

## 2014-09-15 LAB — GLUCOSE, CAPILLARY
GLUCOSE-CAPILLARY: 153 mg/dL — AB (ref 70–99)
Glucose-Capillary: 93 mg/dL (ref 70–99)
Glucose-Capillary: 115 mg/dL — ABNORMAL HIGH (ref 70–99)
Glucose-Capillary: 160 mg/dL — ABNORMAL HIGH (ref 70–99)

## 2014-09-15 LAB — HEMOGLOBIN A1C
HEMOGLOBIN A1C: 6.7 % — AB (ref 4.8–5.6)
MEAN PLASMA GLUCOSE: 146 mg/dL

## 2014-09-15 MED ORDER — ARIPIPRAZOLE 5 MG PO TABS
5.0000 mg | ORAL_TABLET | Freq: Two times a day (BID) | ORAL | Status: DC
  Administered 2014-09-15 – 2014-09-18 (×6): 5 mg via ORAL
  Filled 2014-09-15 (×8): qty 1

## 2014-09-15 MED ORDER — ENSURE ENLIVE PO LIQD: 237.0000 mL | Freq: Three times a day (TID) | ORAL | Status: DC

## 2014-09-15 MED ORDER — BOOST / RESOURCE BREEZE PO LIQD
1.0000 | Freq: Two times a day (BID) | ORAL | Status: DC
  Administered 2014-09-15: 1 via ORAL
  Filled 2014-09-15 (×14): qty 1

## 2014-09-15 MED ORDER — GABAPENTIN 300 MG PO CAPS
600.0000 mg | ORAL_CAPSULE | Freq: Three times a day (TID) | ORAL | Status: DC
  Administered 2014-09-15 – 2014-09-20 (×16): 600 mg via ORAL
  Filled 2014-09-15 (×21): qty 2

## 2014-09-15 NOTE — Progress Notes (Signed)
Patient ID: Billy FlakesMelvin K [place]ay, male   DOB: 10-Sep-1960, 54 y.o.   MRN: [id number]  D: Patient pleasant and cooperative on assessment. Pt smiling and talkative this evening. Pt compliant with medications at HS. No inappropriate behaviors noted. A: Q 15 minute safety checks, encourage staff/peer interaction and group participation. Administer medications as ordered by provider. R: No s/s of distress noted during shift.

## 2014-09-15 NOTE — Progress Notes (Signed)
[facility]  09/15/2014 1:58 PM Billy FlakesMelvin K [name]  MRN:  [id number] Subjective:  States he feels like giving up. States he is tired of trying. He did sleep better last night but continues to feel very depressed hopeless helpless. States he hears voices when he is by himself as when there is people around their noises help to drawn the voices. He states that when he thinks things are turning around for him something happens like it happened this time around when he lost his placement. He is still feeling suicidal although he is willing to contract for safety Principal Problem: Bipolar affective disorder, depressed, severe, with psychotic behavior Diagnosis:   Patient Active Problem List   Diagnosis Date Noted  . Bipolar affective disorder, depressed, severe, with psychotic behavior [F31.5] 09/14/2014    Priority: High  . Chronic pain syndrome [G89.4] 09/14/2014    Priority: High  . Cocaine abuse [F14.10] 09/13/2014    Priority: High  . Alcohol abuse [F10.10] 10/12/2012    Priority: High  . Opiate abuse, episodic [F11.10] 09/02/2011    Priority: High  . Alcohol dependence with uncomplicated withdrawal [F10.230] 09/13/2014  . Homeless [Z59.0] 09/02/2011  . Diabetes mellitus [250] 08/30/2011  . WPW (Wolff-Parkinson-White syndrome) [I45.6] 08/30/2011   Total Time spent with patient: 30 minutes   Past Medical History:  Past Medical History  Diagnosis Date  . Diabetes mellitus   . Hepatitis C carrier   . WPW (Wolff-Parkinson-White syndrome)   . Depression   . Chronic pain   . Suicide attempt   . Diabetes mellitus without complication   . Hypertension   . Bipolar 1 disorder   . HCV (hepatitis C virus)   . Homeless     Past Surgical History  Procedure Laterality Date  . Rhinoplasty    . Chest surgery    . Back surgery    . Tonsillectomy     Family History: History reviewed. No pertinent family history. Social History:  History  Alcohol Use  . Yes    Comment: heavy      History  Drug Use  . Yes  . Special: Marijuana, "Crack" cocaine    History   Social History  . Marital Status: Divorced    Spouse Name: N/A  . Number of Children: N/A  . Years of Education: N/A   Social History Main Topics  . Smoking status: Current Every Day Smoker -- 0.25 packs/day for 8 years    Types: Cigarettes  . Smokeless tobacco: Not on file  . Alcohol Use: Yes     Comment: heavy  . Drug Use: Yes    Special: Marijuana, "Crack" cocaine  . Sexual Activity: Not on file   Other Topics Concern  . None   Social History Narrative   ** Merged History Encounter **       Additional History:    Sleep: Fair  Appetite:  Poor   Assessment:   Musculoskeletal: Strength & Muscle Tone: within normal limits Gait & Station: normal Patient leans: N/A   Psychiatric Specialty Exam: Physical Exam  Review of Systems  Constitutional: Positive for malaise/fatigue.  Eyes: Negative.   Respiratory: Negative.   Cardiovascular: Negative.   Gastrointestinal: Positive for nausea and constipation.       States he has not been able to eat today as feels he cant swallow. States this happens when he is depressed  Musculoskeletal: Positive for back pain.       Neuropathy   Skin: Negative.  Neurological: Positive for weakness and headaches.  Endo/Heme/Allergies: Negative.   Psychiatric/Behavioral: Positive for depression, suicidal ideas and substance abuse. The patient is nervous/anxious.     Blood pressure 135/99, pulse 77, temperature 97.7 F (36.5 C), temperature source Oral, resp. rate 20, height 5\' 11"  (1.803 m), weight 89.812 kg (198 lb), SpO2 97 %.Body mass index is 27.63 kg/(m^2).  General Appearance: Disheveled  Eye Solicitor::  Fair  Speech:  Clear and Coherent  Volume:  fluctuates  Mood:  Anxious, Depressed and Dysphoric  Affect:  Depressed, Tearful and anxious worried  Thought Process:  Coherent and Goal Directed  Orientation:  Full (Time, Place, and Person)   Thought Content:  symptoms events worries concerns  Suicidal Thoughts:  Yes.  without intent/plan  Homicidal Thoughts:  No  Memory:  Immediate;   Fair Recent;   Fair Remote;   Fair  Judgement:  Fair  Insight:  Present and Shallow  Psychomotor Activity:  Restlessness  Concentration:  Fair  Recall:  [employer] of Knowledge:Fair  Language: Fair  Akathisia:  No  Handed:  Right  AIMS (if indicated):     Assets:  Desire for Improvement  ADL's:  Intact  Cognition: WNL  Sleep:  Number of Hours: 5.25     Current Medications: Current Facility-Administered Medications  Medication Dose Route Frequency Provider Last Rate Last Dose  . acetaminophen (TYLENOL) tablet 650 mg  650 mg Oral Q6H PRN Charm Rings, NP      . albuterol (PROVENTIL HFA;VENTOLIN HFA) 108 (90 BASE) MCG/ACT inhaler 2 puff  2 puff Inhalation Q6H PRN Rachael Fee, MD      . alum & mag hydroxide-simeth (MAALOX/MYLANTA) 200-200-20 MG/5ML suspension 30 mL  30 mL Oral Q4H PRN Charm Rings, NP      . ARIPiprazole (ABILIFY) tablet 5 mg  5 mg Oral Daily Charm Rings, NP   5 mg at 09/15/14 0804  . citalopram (CELEXA) tablet 20 mg  20 mg Oral Daily Charm Rings, NP   20 mg at 09/15/14 0806  . cloNIDine (CATAPRES) tablet 0.1 mg  0.1 mg Oral QID Shuvon B Rankin, NP   0.1 mg at 09/15/14 1240   Followed by  . [START ON 09/16/2014] cloNIDine (CATAPRES) tablet 0.1 mg  0.1 mg Oral BH-qamhs Shuvon B Rankin, NP       Followed by  . [START ON 09/18/2014] cloNIDine (CATAPRES) tablet 0.1 mg  0.1 mg Oral QAC breakfast Shuvon B Rankin, NP      . dicyclomine (BENTYL) tablet 20 mg  20 mg Oral Q6H PRN Shuvon B Rankin, NP   20 mg at 09/14/14 0838  . feeding supplement (ENSURE ENLIVE) (ENSURE ENLIVE) liquid 237 mL  237 mL Oral PRN Normand Sloop, RD      . gabapentin (NEURONTIN) capsule 300 mg  300 mg Oral TID Charm Rings, NP   300 mg at 09/15/14 1239  . hydrOXYzine (ATARAX/VISTARIL) tablet 25 mg  25 mg Oral Q6H PRN Shuvon B Rankin, NP       . insulin aspart (novoLOG) injection 0-15 Units  0-15 Units Subcutaneous TID WC Kerry Hough, PA-C   3 Units at 09/14/14 1727  . insulin glargine (LANTUS) injection 30 Units  30 Units Subcutaneous QHS Kerry Hough, PA-C   30 Units at 09/13/14 2228  . lisinopril (PRINIVIL,ZESTRIL) tablet 10 mg  10 mg Oral Daily Charm Rings, NP   10 mg at 09/15/14 8469  . lithium carbonate  capsule 300 mg  300 mg Oral Daily Charm Rings, NP   300 mg at 09/15/14 0805  . lithium carbonate capsule 600 mg  600 mg Oral QHS Charm Rings, NP   600 mg at 09/14/14 2148  . loperamide (IMODIUM) capsule 2-4 mg  2-4 mg Oral PRN Shuvon B Rankin, NP      . LORazepam (ATIVAN) tablet 1 mg  1 mg Oral Q6H PRN Shuvon B Rankin, NP      . LORazepam (ATIVAN) tablet 1 mg  1 mg Oral TID Shuvon B Rankin, NP   1 mg at 09/15/14 1239   Followed by  . [START ON 09/16/2014] LORazepam (ATIVAN) tablet 1 mg  1 mg Oral BID Shuvon B Rankin, NP       Followed by  . [START ON 09/17/2014] LORazepam (ATIVAN) tablet 1 mg  1 mg Oral Daily Shuvon B Rankin, NP      . magnesium hydroxide (MILK OF MAGNESIA) suspension 30 mL  30 mL Oral Daily PRN Charm Rings, NP   30 mL at 09/15/14 0829  . methocarbamol (ROBAXIN) tablet 500 mg  500 mg Oral Q8H PRN Shuvon B Rankin, NP   500 mg at 09/14/14 2150  . multivitamin with minerals tablet 1 tablet  1 tablet Oral Daily Shuvon B Rankin, NP   1 tablet at 09/15/14 0804  . naproxen (NAPROSYN) tablet 500 mg  500 mg Oral BID PRN Shuvon B Rankin, NP   500 mg at 09/14/14 0838  . nicotine (NICODERM CQ - dosed in mg/24 hr) patch 7 mg  7 mg Transdermal Daily Rachael Fee, MD   7 mg at 09/15/14 0806  . ondansetron (ZOFRAN-ODT) disintegrating tablet 4 mg  4 mg Oral Q6H PRN Shuvon B Rankin, NP   4 mg at 09/15/14 1239  . pantoprazole (PROTONIX) EC tablet 40 mg  40 mg Oral Daily Charm Rings, NP   40 mg at 09/15/14 0805  . rivaroxaban (XARELTO) tablet 20 mg  20 mg Oral Q supper Charm Rings, NP   20 mg at 09/14/14  1721  . thiamine (VITAMIN B-1) tablet 100 mg  100 mg Oral Daily Shuvon B Rankin, NP   100 mg at 09/15/14 0807  . tiotropium (SPIRIVA) inhalation capsule 18 mcg  18 mcg Inhalation Daily Rachael Fee, MD   18 mcg at 09/15/14 [phone]  . tiZANidine (ZANAFLEX) tablet 4 mg  4 mg Oral TID Rachael Fee, MD   4 mg at 09/15/14 1239    Lab Results:  Results for orders placed or performed during the hospital encounter of 09/13/14 (from the past 48 hour(s))  Glucose, capillary     Status: Abnormal   Collection Time: 09/13/14  9:10 PM  Result Value Ref Range   Glucose-Capillary 152 (H) 70 - 99 mg/dL  Glucose, capillary     Status: Abnormal   Collection Time: 09/14/14  6:21 AM  Result Value Ref Range   Glucose-Capillary 120 (H) 70 - 99 mg/dL  Hemoglobin R6E     Status: Abnormal   Collection Time: 09/14/14  6:30 AM  Result Value Ref Range   Hgb A1c MFr Bld 6.7 (H) 4.8 - 5.6 %    Comment: (NOTE)         Pre-diabetes: 5.7 - 6.4         Diabetes: >6.4         Glycemic control for adults with diabetes: <7.0    Mean Plasma Glucose  146 mg/dL    Comment: (NOTE) Performed At: [facility] [address] [place], [place] [id number] Mila Homer MD [id number] Performed at [facility]   Glucose, capillary     Status: None   Collection Time: 09/14/14 12:03 PM  Result Value Ref Range   Glucose-Capillary 97 70 - 99 mg/dL  Glucose, capillary     Status: Abnormal   Collection Time: 09/14/14  5:19 PM  Result Value Ref Range   Glucose-Capillary 178 (H) 70 - 99 mg/dL   Comment 1 Notify RN   Glucose, capillary     Status: Abnormal   Collection Time: 09/14/14  9:47 PM  Result Value Ref Range   Glucose-Capillary 104 (H) 70 - 99 mg/dL   Comment 1 Notify RN    Comment 2 Document in Chart   Glucose, capillary     Status: None   Collection Time: 09/15/14  6:27 AM  Result Value Ref Range   Glucose-Capillary 93 70 - 99 mg/dL   Comment 1 Notify RN    Comment 2 Document in  Chart   Glucose, capillary     Status: Abnormal   Collection Time: 09/15/14 11:55 AM  Result Value Ref Range   Glucose-Capillary 153 (H) 70 - 99 mg/dL    Physical Findings: AIMS: Facial and Oral Movements Muscles of Facial Expression: None, normal Lips and Perioral Area: None, normal Jaw: None, normal Tongue: None, normal,Extremity Movements Upper (arms, wrists, hands, fingers): None, normal Lower (legs, knees, ankles, toes): None, normal, Trunk Movements Neck, shoulders, hips: None, normal, Overall Severity Severity of abnormal movements (highest score from questions above): None, normal Incapacitation due to abnormal movements: None, normal Patient's awareness of abnormal movements (rate only patient's report): No Awareness, Dental Status Current problems with teeth and/or dentures?: Yes (missing teeth) Does patient usually wear dentures?: No  CIWA:  CIWA-Ar Total: 6 COWS:  COWS Total Score: 0  Treatment Plan Summary: Daily contact with patient to assess and evaluate symptoms and progress in treatment and Medication management Supportive approach/coping skills/relapse prevention Depression: continue the Celexa 20 Psychotic symptoms: increase the Abilify to 5 mg BID Mood instability: continue to work with Lithium, optimize response Pain: will increase the Neurontin to 600 mg TID states he was using up to 1200 mg TID CBT/mindfulness  Medical Decision Making:  Review of Psycho-Social Stressors (1), Review or order clinical lab tests (1), Review of Medication Regimen & Side Effects (2) and Review of New Medication or Change in Dosage (2)     Venecia Mehl A 09/15/2014, 1:58 PM

## 2014-09-15 NOTE — [facility] Group Notes (Signed)
[facility] LCSW Group Therapy  09/15/2014 12:53 PM  Type of Therapy:  Group Therapy  Participation Level:  Did Not Attend-pt invited. In room resting.   Summary of Progress/Problems: Feelings around Relapse. Group members discussed the meaning of relapse and shared personal stories of relapse, how it affected them and others, and how they perceived themselves during this time. Group members were encouraged to identify triggers, warning signs and coping skills used when facing the possibility of relapse. Social supports were discussed and explored in detail. Post Acute Withdrawal Syndrome (handout provided) was introduced and examined. Pt's were encouraged to ask questions, talk about key points associated with PAWS, and process this information in terms of relapse prevention.   Smart, Remonia Otte LCSWA  09/15/2014, 12:53 PM

## 2014-09-15 NOTE — [facility] Suicide Risk Assessment (Signed)
[facility] INPATIENT:  Family/Significant Other Suicide Prevention Education  Suicide Prevention Education:  Patient Refusal for Family/Significant Other Suicide Prevention Education: The patient Billy [name] has refused to provide written consent for family/significant other to be provided Family/Significant Other Suicide Prevention Education during admission and/or prior to discharge.  Physician notified.  SPE completed with pt. SPI pamphlet provided to pt and he was encouraged to share information with support network, ask questions, and talk about any concerns relating to SPE. Pt denies SI/HI/AVH and denies access to guns/firearms. Pt was also given mobile crisis information.   Smart, [name] LCSWA  09/15/2014, 12:35 PM

## 2014-09-15 NOTE — [facility] Group Notes (Signed)
[facility]   09/15/2014 11:20 AM  Participation Quality:  Minimal   Mood/Affect:  Flat  Depression Rating:  9  Anxiety Rating:  9  Thoughts of Suicide:  No Will you contract for safety?   NA  Current AVH:  No  Plan for Discharge/Comments:  Pt reports that [facility] or go to shelter at d/c until he gets his check on May 3rd. [facility] with Patton SallesAnita Jones and see his PCP for med management. "my biggest concern is getting my medications corrected. I won't leave here until that happens." Pt irritable this morning. Reports no withdrawals. Fair sleep. "Physical pain is causing 75% of my depression."   Transportation Means: bus   Supports: none identified by pt.   Smart, American FinancialHeather LCSWA

## 2014-09-15 NOTE — Progress Notes (Signed)
Nutrition Follow-up Note  NUTRITION DIAGNOSIS: Unintentional weight loss related to sub-optimal intake as evidenced by pt report; ongoing  Goal: Pt to meet >/= 90% of their estimated nutrition needs; not met  Assessment: Consult received for nutrition assessment. Full assessment completed 4/14.  Spoke with RN who reported that pt has been nauseated and has had very poor po.  Pt reports that he has not been able to hold anything down. Lying in bed with eyes closed during RD visit.  Has not tried nutritional supplements.   Interventions: Will order Resource Breeze clear liquid nutritional supplements BID to be administered until PO improves.  D/C Ensure Enlive.   Laurette Schimke [place], [place], [place]

## 2014-09-15 NOTE — Clinical Social Work Note (Signed)
Appts made with pt's PCP for med management and [facility]. Pt provided with comprehensive [facility] list, [place], [facility] [employer] info, [facility] [facility] [employer] [facility] info, and shelter information. (per his request). CSW encouraged pt to call [facility] and [facility] ([facility] over the weekend. Pt verbalized understanding of above information.   The Sherwin-WilliamsHeather Smart, LCSWA 09/15/2014 1:13 PM

## 2014-09-16 DIAGNOSIS — R45851 Suicidal ideations: Secondary | ICD-10-CM

## 2014-09-16 DIAGNOSIS — F315 Bipolar disorder, current episode depressed, severe, with psychotic features: Principal | ICD-10-CM

## 2014-09-16 LAB — GLUCOSE, CAPILLARY
GLUCOSE-CAPILLARY: 110 mg/dL — AB (ref 70–99)
GLUCOSE-CAPILLARY: 145 mg/dL — AB (ref 70–99)
Glucose-Capillary: 90 mg/dL (ref 70–99)

## 2014-09-16 MED ORDER — FLEET ENEMA 7-19 GM/118ML RE ENEM
1.0000 | ENEMA | Freq: Once | RECTAL | Status: AC
  Administered 2014-09-16: 1 via RECTAL
  Filled 2014-09-16 (×2): qty 1

## 2014-09-16 MED ORDER — DOCUSATE SODIUM 100 MG PO CAPS
100.0000 mg | ORAL_CAPSULE | Freq: Two times a day (BID) | ORAL | Status: DC | PRN
  Administered 2014-09-16 – 2014-09-17 (×3): 100 mg via ORAL
  Filled 2014-09-16 (×3): qty 1

## 2014-09-16 NOTE — Progress Notes (Signed)
Patient ID: Billy [name], male   DOB: 07/23/1960, 54 y.o.   MRN: [id number] Adult Psychoeducational Group Note  Date:  09/16/2014 Time: 10:30am  Group Topic/Focus:  Healthy Communication:   The focus of this group is to discuss communication, barriers to communication, as well as healthy ways to communicate with others. Making Healthy Choices:   The focus of this group is to help patients identify negative/unhealthy choices they were using prior to admission and identify positive/healthier coping strategies to replace them upon discharge. Managing Feelings:   The focus of this group is to identify what feelings patients have difficulty handling and develop a plan to handle them in a healthier way upon discharge. Primary and Secondary Emotions:   The focus of this group is to discuss the difference between primary and secondary emotions.  Participation Level:  Active  Participation Quality:  Attentive  Affect:  Animated  Cognitive:  Appropriate  Insight: Improving  Engagement in Group:  Engaged  Modes of Intervention:  Activity, Discussion, Education and Support  Additional Comments:  Pt able to identify positive and negative personal coping skills. Pt shows insight into his actions and emotions.   Billy [name], Billy [name] 09/16/2014, 12:19 PM

## 2014-09-16 NOTE — Progress Notes (Signed)
Patient did not attend the evening speaker AA meeting. Pt was notified that group was beginning but remained in his room.    

## 2014-09-16 NOTE — Progress Notes (Signed)
Patient ID: Billy FlakesMelvin K [name], male   DOB: July 21, 1960, 54 y.o.   MRN: [id number]  Adult Psychoeducational Group Note  Date:  09/16/2014 Time: 09:30am  Group Topic/Focus:  Orientation:   The focus of this group is to educate the patient on the purpose and policies of crisis stabilization and provide a format to answer questions about their admission.  The group details unit policies and expectations of patients while admitted.  Participation Level:  Active  Participation Quality:  Attentive  Affect:  Flat  Cognitive:  Appropriate  Insight: Improving  Engagement in Group:  Engaged and Monopolizing  Modes of Intervention:  Discussion, Education, Orientation and Support  Additional Comments:  Pt able to identify one daily goal to accomplish today.   Aurora Maskwyman, Kathryn Linarez E 09/16/2014, 10:26 AM

## 2014-09-16 NOTE — Progress Notes (Addendum)
Patient ID: Maudie FlakesMelvin K Balsley, male   DOB: March 08, 1961, 54 y.o.   MRN: [id number]  Pt currently presents with a flat affect and irritable, anxious behavior. Pt main concern today is increasing constipation and states "I went today but it was like little pebbles and I want to get rid of these demons inside me." Pt attends groups and is cooperative.   Pt provided with medications per providers orders. Pt's labs and vitals were monitored throughout the day. Pt supported emotionally and encouraged to express concerns and questions. Pt educated on medications, benefits of exercise and carb modified diet. Pt given a FLEET enema today after increasing stomach cramps and worsening constipation.   Pt's safety ensured with 15 minute and environmental checks. Pt currently denies HI and A/V hallucinations. Pt reports SI stating "Pt verbally agrees to seek staff if SI/HI or A/VH occurs and to consult with staff before acting on these thoughts. Pt resistant to education on medication adherence, benefits of exercise and changes in diet. Pt reports relief after enema.

## 2014-09-16 NOTE — Progress Notes (Signed)
D.  Pt pleasant on approach after initially being upset when the lights in his room were turned on to take his CBG.  Pt remained in bed during group, states he had been ill earlier today.  Interacting appropriately with peers on the unit. Denies SI/HI/hallucinations at this time.  A.  Support and encouragement offered, medication given as ordered for anxiety.  R.  Pt remains safe on unit, will continue to monitor.

## 2014-09-16 NOTE — Progress Notes (Signed)
[facility] MD Progress Note  09/16/2014 10:51 AM Billy [name]  MRN:  [id number] Subjective:  I am waiting my medicines to work.  I don't think I will be discharged soon.    Objective; Patient seen chart reviewed.  Patient remains very irritable and continued to endorse suicidal thoughts, hallucination, paranoia and feeling hopeless and helpless.  He endorsed voices telling him to do bad things but he contract for safety.  He admitted sleep on and off.  He endorse racing thoughts.  He is taking his medication however sometime he is not sure if it is working.  He feels that he may require a lot of days to stabilize on his medication.  He gets easily paranoid.  He is going to the group but he has limited participation.  He denies any side effects of medication.  Principal Problem: Bipolar affective disorder, depressed, severe, with psychotic behavior Diagnosis:   Patient Active Problem List   Diagnosis Date Noted  . Bipolar affective disorder, depressed, severe, with psychotic behavior [F31.5] 09/14/2014  . Chronic pain syndrome [G89.4] 09/14/2014  . Alcohol dependence with uncomplicated withdrawal [F10.230] 09/13/2014  . Cocaine abuse [F14.10] 09/13/2014  . Alcohol abuse [F10.10] 10/12/2012  . Homeless [Z59.0] 09/02/2011  . Opiate abuse, episodic [F11.10] 09/02/2011  . Diabetes mellitus [250] 08/30/2011  . WPW (Wolff-Parkinson-White syndrome) [I45.6] 08/30/2011   Total Time spent with patient: 30 minutes   Past Medical History:  Past Medical History  Diagnosis Date  . Diabetes mellitus   . Hepatitis C carrier   . WPW (Wolff-Parkinson-White syndrome)   . Depression   . Chronic pain   . Suicide attempt   . Diabetes mellitus without complication   . Hypertension   . Bipolar 1 disorder   . HCV (hepatitis C virus)   . Homeless     Past Surgical History  Procedure Laterality Date  . Rhinoplasty    . Chest surgery    . Back surgery    . Tonsillectomy     Family History: History  reviewed. No pertinent family history. Social History:  History  Alcohol Use  . Yes    Comment: heavy     History  Drug Use  . Yes  . Special: Marijuana, "Crack" cocaine    History   Social History  . Marital Status: Divorced    Spouse Name: N/A  . Number of Children: N/A  . Years of Education: N/A   Social History Main Topics  . Smoking status: Current Every Day Smoker -- 0.25 packs/day for 8 years    Types: Cigarettes  . Smokeless tobacco: Not on file  . Alcohol Use: Yes     Comment: heavy  . Drug Use: Yes    Special: Marijuana, "Crack" cocaine  . Sexual Activity: Not on file   Other Topics Concern  . None   Social History Narrative   ** Merged History Encounter **       Additional History:    Sleep: Fair  Appetite:  Poor   Assessment:   Musculoskeletal: Strength & Muscle Tone: within normal limits Gait & Station: normal Patient leans: N/A   Psychiatric Specialty Exam: Physical Exam  Review of Systems  Skin: Negative for itching and rash.  Psychiatric/Behavioral: Positive for depression, suicidal ideas and hallucinations. The patient is nervous/anxious and has insomnia.     Blood pressure 131/94, pulse 64, temperature 98.6 F (37 C), temperature source Oral, resp. rate 20, height 5\' 11"  (1.803 m), weight 89.812 kg (198 lb),  SpO2 97 %.Body mass index is 27.63 kg/(m^2).  General Appearance: Disheveled  Eye Solicitor::  Fair  Speech:  Pressured  Volume:  fluctuates  Mood:  Anxious, Depressed, Dysphoric and Irritable  Affect:  Depressed, Tearful and anxious worried  Thought Process:  Coherent and Goal Directed  Orientation:  Full (Time, Place, and Person)  Thought Content:  symptoms events worries concerns  Suicidal Thoughts:  Yes.  without intent/plan  Homicidal Thoughts:  No  Memory:  Immediate;   Fair Recent;   Fair Remote;   Fair  Judgement:  Fair  Insight:  Present and Shallow  Psychomotor Activity:  Restlessness  Concentration:  Fair   Recall:  [employer] of Knowledge:Fair  Language: Fair  Akathisia:  No  Handed:  Right  AIMS (if indicated):     Assets:  Desire for Improvement  ADL's:  Intact  Cognition: WNL  Sleep:  Number of Hours: 5.25     Current Medications: Current Facility-Administered Medications  Medication Dose Route Frequency Provider Last Rate Last Dose  . acetaminophen (TYLENOL) tablet 650 mg  650 mg Oral Q6H PRN Charm Rings, NP      . albuterol (PROVENTIL HFA;VENTOLIN HFA) 108 (90 BASE) MCG/ACT inhaler 2 puff  2 puff Inhalation Q6H PRN Rachael Fee, MD      . alum & mag hydroxide-simeth (MAALOX/MYLANTA) 200-200-20 MG/5ML suspension 30 mL  30 mL Oral Q4H PRN Charm Rings, NP      . ARIPiprazole (ABILIFY) tablet 5 mg  5 mg Oral BID Rachael Fee, MD   5 mg at 09/16/14 [phone]  . citalopram (CELEXA) tablet 20 mg  20 mg Oral Daily Charm Rings, NP   20 mg at 09/16/14 9604  . cloNIDine (CATAPRES) tablet 0.1 mg  0.1 mg Oral BH-qamhs Shuvon B Rankin, NP   0.1 mg at 09/16/14 0838   Followed by  . [START ON 09/18/2014] cloNIDine (CATAPRES) tablet 0.1 mg  0.1 mg Oral QAC breakfast Shuvon B Rankin, NP      . dicyclomine (BENTYL) tablet 20 mg  20 mg Oral Q6H PRN Shuvon B Rankin, NP   20 mg at 09/14/14 0838  . docusate sodium (COLACE) capsule 100 mg  100 mg Oral BID PRN Adonis Brook, NP      . feeding supplement (RESOURCE BREEZE) (RESOURCE BREEZE) liquid 1 Container  1 Container Oral BID BM Normand Sloop, RD   1 Container at 09/15/14 1545  . gabapentin (NEURONTIN) capsule 600 mg  600 mg Oral TID Rachael Fee, MD   600 mg at 09/16/14 [phone]  . hydrOXYzine (ATARAX/VISTARIL) tablet 25 mg  25 mg Oral Q6H PRN Shuvon B Rankin, NP      . insulin aspart (novoLOG) injection 0-15 Units  0-15 Units Subcutaneous TID WC Kerry Hough, PA-C   3 Units at 09/14/14 1727  . insulin glargine (LANTUS) injection 30 Units  30 Units Subcutaneous QHS Kerry Hough, PA-C   30 Units at 09/15/14 2132  . lisinopril  (PRINIVIL,ZESTRIL) tablet 10 mg  10 mg Oral Daily Charm Rings, NP   10 mg at 09/16/14 8119  . lithium carbonate capsule 300 mg  300 mg Oral Daily Charm Rings, NP   300 mg at 09/16/14 1478  . lithium carbonate capsule 600 mg  600 mg Oral QHS Charm Rings, NP   600 mg at 09/15/14 2131  . loperamide (IMODIUM) capsule 2-4 mg  2-4 mg Oral PRN Shuvon B Rankin,  NP      . LORazepam (ATIVAN) tablet 1 mg  1 mg Oral Q6H PRN Shuvon B Rankin, NP   1 mg at 09/15/14 2300  . LORazepam (ATIVAN) tablet 1 mg  1 mg Oral BID Shuvon B Rankin, NP   1 mg at 09/16/14 0842   Followed by  . [START ON 09/17/2014] LORazepam (ATIVAN) tablet 1 mg  1 mg Oral Daily Shuvon B Rankin, NP      . magnesium hydroxide (MILK OF MAGNESIA) suspension 30 mL  30 mL Oral Daily PRN Charm Rings, NP   30 mL at 09/15/14 0829  . methocarbamol (ROBAXIN) tablet 500 mg  500 mg Oral Q8H PRN Shuvon B Rankin, NP   500 mg at 09/15/14 2131  . multivitamin with minerals tablet 1 tablet  1 tablet Oral Daily Shuvon B Rankin, NP   1 tablet at 09/16/14 0837  . naproxen (NAPROSYN) tablet 500 mg  500 mg Oral BID PRN Shuvon B Rankin, NP   500 mg at 09/14/14 0838  . nicotine (NICODERM CQ - dosed in mg/24 hr) patch 7 mg  7 mg Transdermal Daily Rachael Fee, MD   7 mg at 09/16/14 0839  . ondansetron (ZOFRAN-ODT) disintegrating tablet 4 mg  4 mg Oral Q6H PRN Shuvon B Rankin, NP   4 mg at 09/15/14 1239  . pantoprazole (PROTONIX) EC tablet 40 mg  40 mg Oral Daily Charm Rings, NP   40 mg at 09/16/14 0837  . rivaroxaban (XARELTO) tablet 20 mg  20 mg Oral Q supper Charm Rings, NP   20 mg at 09/15/14 1724  . thiamine (VITAMIN B-1) tablet 100 mg  100 mg Oral Daily Shuvon B Rankin, NP   100 mg at 09/16/14 0837  . tiotropium (SPIRIVA) inhalation capsule 18 mcg  18 mcg Inhalation Daily Rachael Fee, MD   18 mcg at 09/16/14 [phone]  . tiZANidine (ZANAFLEX) tablet 4 mg  4 mg Oral TID Rachael Fee, MD   4 mg at 09/16/14 9604    Lab Results:  Results for orders  placed or performed during the hospital encounter of 09/13/14 (from the past 48 hour(s))  Glucose, capillary     Status: None   Collection Time: 09/14/14 12:03 PM  Result Value Ref Range   Glucose-Capillary 97 70 - 99 mg/dL  Glucose, capillary     Status: Abnormal   Collection Time: 09/14/14  5:19 PM  Result Value Ref Range   Glucose-Capillary 178 (H) 70 - 99 mg/dL   Comment 1 Notify RN   Glucose, capillary     Status: Abnormal   Collection Time: 09/14/14  9:47 PM  Result Value Ref Range   Glucose-Capillary 104 (H) 70 - 99 mg/dL   Comment 1 Notify RN    Comment 2 Document in Chart   Glucose, capillary     Status: None   Collection Time: 09/15/14  6:27 AM  Result Value Ref Range   Glucose-Capillary 93 70 - 99 mg/dL   Comment 1 Notify RN    Comment 2 Document in Chart   Glucose, capillary     Status: Abnormal   Collection Time: 09/15/14 11:55 AM  Result Value Ref Range   Glucose-Capillary 153 (H) 70 - 99 mg/dL  Glucose, capillary     Status: Abnormal   Collection Time: 09/15/14  5:02 PM  Result Value Ref Range   Glucose-Capillary 115 (H) 70 - 99 mg/dL   Comment 1 Notify RN  Glucose, capillary     Status: Abnormal   Collection Time: 09/15/14  9:04 PM  Result Value Ref Range   Glucose-Capillary 160 (H) 70 - 99 mg/dL  Glucose, capillary     Status: None   Collection Time: 09/16/14  6:08 AM  Result Value Ref Range   Glucose-Capillary 90 70 - 99 mg/dL    Physical Findings: AIMS: Facial and Oral Movements Muscles of Facial Expression: None, normal Lips and Perioral Area: None, normal Jaw: None, normal Tongue: None, normal,Extremity Movements Upper (arms, wrists, hands, fingers): None, normal Lower (legs, knees, ankles, toes): None, normal, Trunk Movements Neck, shoulders, hips: None, normal, Overall Severity Severity of abnormal movements (highest score from questions above): None, normal Incapacitation due to abnormal movements: None, normal Patient's awareness of  abnormal movements (rate only patient's report): No Awareness, Dental Status Current problems with teeth and/or dentures?: Yes (missing teeth) Does patient usually wear dentures?: No  CIWA:  CIWA-Ar Total: 2 COWS:  COWS Total Score: 1  Treatment Plan Summary: Daily contact with patient to assess and evaluate symptoms and progress in treatment and Medication management Supportive approach/coping skills/relapse prevention, CPT/mindfulness Depression: continue the Celexa 20 Psychotic symptoms: Continue Abilify to 5 mg BID Mood instability: continue to work with Lithium, we will get lithium level.  His last level was nontherapeutic.  We will consider optimize response Pain: Continue Neurontin to 600 mg TID.    Medical Decision Making:  Review of Psycho-Social Stressors (1), Review or order clinical lab tests (1), Review of Last Therapy Session (1), Review of Medication Regimen & Side Effects (2) and Review of New Medication or Change in Dosage (2)     Billy Luckadoo T. 09/16/2014, 10:51 AM

## 2014-09-16 NOTE — [facility] Group Notes (Signed)
[facility] Group Notes:  (Clinical Social Work)  09/16/2014   1:15-2:15PM  Summary of Progress/Problems:   The main focus of today's process group was for the patient to identify ways in which they have sabotaged their own mental health wellness/recovery.  Motivational interviewing and a handout were used to explore the benefits and costs of their self-sabotaging behavior as well as the benefits and costs of changing this behavior.  The Stages of Change were explained to the group using a handout, and patients identified where they are with regard to changing self-defeating behaviors.  The patient was late to group due to being with the NP, but participated well.  Type of Therapy:  Process Group  Participation Level:  Active  Participation Quality:  Attentive and Sharing  Affect:  Blunted  Cognitive:  Appropriate and Oriented  Insight:  Engaged  Engagement in Therapy:  Engaged  Modes of Intervention:  Education, Motivational Interviewing   Ambrose MantleMareida Grossman-Orr, LCSW 09/16/2014, 4:00pm

## 2014-09-17 LAB — GLUCOSE, CAPILLARY
GLUCOSE-CAPILLARY: 183 mg/dL — AB (ref 70–99)
GLUCOSE-CAPILLARY: 238 mg/dL — AB (ref 70–99)
GLUCOSE-CAPILLARY: 98 mg/dL (ref 70–99)
GLUCOSE-CAPILLARY: 178 mg/dL — AB (ref 70–99)
Glucose-Capillary: 74 mg/dL (ref 70–99)
Glucose-Capillary: 100 mg/dL — ABNORMAL HIGH (ref 70–99)

## 2014-09-17 LAB — LITHIUM LEVEL: LITHIUM LVL: 0.63 mmol/L — AB (ref 0.80–1.40)

## 2014-09-17 MED ORDER — PROPRANOLOL HCL 10 MG PO TABS
10.0000 mg | ORAL_TABLET | Freq: Two times a day (BID) | ORAL | Status: DC
  Administered 2014-09-17: 10 mg via ORAL
  Filled 2014-09-17 (×4): qty 1

## 2014-09-17 MED ORDER — OXYMETAZOLINE HCL 0.05 % NA SOLN
1.0000 | Freq: Two times a day (BID) | NASAL | Status: DC | PRN
  Administered 2014-09-17 – 2014-09-21 (×9): 1 via NASAL
  Filled 2014-09-17: qty 15

## 2014-09-17 MED ORDER — LORAZEPAM 1 MG PO TABS
1.0000 mg | ORAL_TABLET | Freq: Once | ORAL | Status: AC
  Administered 2014-09-17: 1 mg via ORAL
  Filled 2014-09-17: qty 1

## 2014-09-17 MED ORDER — TRAZODONE HCL 50 MG PO TABS
50.0000 mg | ORAL_TABLET | Freq: Every evening | ORAL | Status: DC | PRN
  Administered 2014-09-17 – 2014-09-20 (×3): 50 mg via ORAL
  Filled 2014-09-17: qty 14
  Filled 2014-09-17 (×3): qty 1

## 2014-09-17 NOTE — Progress Notes (Signed)
Patient ID: Maudie FlakesMelvin K Tkach, male   DOB: 28-Oct-1960, 54 y.o.   MRN: [id number] Adult Psychoeducational Group Note  Date:  09/17/2014 Time: 10:30am  Group Topic/Focus:  Identifying Needs:   The focus of this group is to help patients identify their personal needs that have been historically problematic and identify healthy behaviors to address their needs.  Participation Level:  Active  Participation Quality:  Appropriate  Affect:  Appropriate  Cognitive:  Appropriate  Insight: Improving  Engagement in Group:  Improving  Modes of Intervention:  Activity, Discussion, Education and Support  Additional Comments:  Pt able to identify one goal to accomplish to accomplish today.   Aurora Maskwyman, Curry Dulski E 09/17/2014, 4:18 PM

## 2014-09-17 NOTE — Progress Notes (Addendum)
Patient ID: Billy [name], male   DOB: 09-Nov-1960, 54 y.o.   MRN: [id number]  Pt seen in hallway during medpass at approximately 1700. Pt talking to other patients and standing in hallway. Pt reported fall at approximately 1715. Pt stated "My shoulder and hip don't hurt. 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 Charge Nurse and [place] LevoAC, Thurman CoyerEric Kaplan notified. MD, Dr. Lolly MustacheArfeen, notified of pt fall at 1730. BP 186/96, P 47, Temp 97.4 oral, O2 100% on room air, RR 17. Neurological and musculoskeletal assessment completed. Full ROM in R extremities. Pt received medications for chronic back pain. Pt refused any family notification.Per MD recommendation to start pt on q4 vitals until discontinued. Pt placed on High Fall Risk at notifications placed in chart, pt door and pt received arm band/yellow socks. Pt currently in line to go to the cafeteria for dinner. Pt in no current distress.

## 2014-09-17 NOTE — Progress Notes (Addendum)
Patient ID: Billy FlakesMelvin K Whitehouse, male   DOB: 1960/08/16, 54 y.o.   MRN: [id number]  Pt currently presents with a anxious affect and behavior. Pt blames others for his reactions and emotional state. Pt frequently makes statements that are hopeless in nature. Pt irritable throughout the day when he perceives provocation from others. Pt reports via self inventory that his daily goal is to "think positively lol." Pt reports poor sleep and a fair appetite.  Pt provided with medications per providers orders. Pt's labs and vitals were monitored throughout the day. Pt supported emotionally and encouraged to express concerns and questions. Pt educated on medications. Pt supported in numerous 1:1 today about sense of self and self worth.  Pt's safety ensured with 15 minute and environmental checks. Pt currently denies HI and A/V hallucinations. Pt states he "hear my voice telling meio do things or bad things about myself." Pt endorses passive SI and states "they are always there." Pt verbally agrees to seek staff if SI/HI or A/VH occurs and to consult with staff before acting on these thoughts. Pt attends groups today.

## 2014-09-17 NOTE — Progress Notes (Addendum)
[facility] MD Progress Note  09/17/2014 10:34 AM Billy [name]  MRN:  [id number] Subjective:  I'm anxious.  I need more Ativan.  I'm is still waiting my medicines to work.  I don'[name] think I will be discharged soon.    Objective; Patient seen chart reviewed.  Patient remains very irritable and does not feel any medicine is helping him.  He is questioning about his Ativan and detox protocol and insist that he needed more Ativan.  However his tremors and shakes are less intense from the past.  He mentioned that he is not getting enough Neurontin.  He is still feel anxious, depressed and continued to endorse suicidal thoughts every day.  He admitted hallucination and paranoia and sometimes not comfortable around people.  He is hoping Abilify helps and he is happy that it is not causing any side effects.  His lithium level is 0.63.  He is going to the group but he has limited participation.  He denies any side effects of medication.  His blood pressure is persistently high.  Principal Problem: Bipolar affective disorder, depressed, severe, with psychotic behavior Diagnosis:   Patient Active Problem List   Diagnosis Date Noted  . Bipolar affective disorder, depressed, severe, with psychotic behavior [F31.5] 09/14/2014  . Chronic pain syndrome [G89.4] 09/14/2014  . Alcohol dependence with uncomplicated withdrawal [F10.230] 09/13/2014  . Cocaine abuse [F14.10] 09/13/2014  . Alcohol abuse [F10.10] 10/12/2012  . Homeless [Z59.0] 09/02/2011  . Opiate abuse, episodic [F11.10] 09/02/2011  . Diabetes mellitus [250] 08/30/2011  . WPW (Wolff-Parkinson-White syndrome) [I45.6] 08/30/2011   Total Time spent with patient: 30 minutes   Past Medical History:  Past Medical History  Diagnosis Date  . Diabetes mellitus   . Hepatitis C carrier   . WPW (Wolff-Parkinson-White syndrome)   . Depression   . Chronic pain   . Suicide attempt   . Diabetes mellitus without complication   . Hypertension   . Bipolar 1  disorder   . HCV (hepatitis C virus)   . Homeless     Past Surgical History  Procedure Laterality Date  . Rhinoplasty    . Chest surgery    . Back surgery    . Tonsillectomy     Family History: History reviewed. No pertinent family history. Social History:  History  Alcohol Use  . Yes    Comment: heavy     History  Drug Use  . Yes  . Special: Marijuana, "Crack" cocaine    History   Social History  . Marital Status: Divorced    Spouse Name: N/A  . Number of Children: N/A  . Years of Education: N/A   Social History Main Topics  . Smoking status: Current Every Day Smoker -- 0.25 packs/day for 8 years    Types: Cigarettes  . Smokeless tobacco: Not on file  . Alcohol Use: Yes     Comment: heavy  . Drug Use: Yes    Special: Marijuana, "Crack" cocaine  . Sexual Activity: Not on file   Other Topics Concern  . None   Social History Narrative   ** Merged History Encounter **       Additional History:    Sleep: Fair  Appetite:  Poor   Assessment:   Musculoskeletal: Strength & Muscle Tone: within normal limits Gait & Station: normal Patient leans: N/A   Psychiatric Specialty Exam: Physical Exam  Review of Systems  Musculoskeletal: Positive for back pain.  Skin: Negative for itching and rash.  Psychiatric/Behavioral:  Positive for depression and substance abuse. The patient is nervous/anxious and has insomnia.     Blood pressure 164/101, pulse 72, temperature 98 F (36.7 C), temperature source Oral, resp. rate 16, height 5\' 11"  (1.803 m), weight 89.812 kg (198 lb), SpO2 97 %.Body mass index is 27.63 kg/(m^2).  General Appearance: Disheveled  Eye Solicitor::  Fair  Speech:  Pressured  Volume:  fluctuates  Mood:  Anxious, Depressed, Dysphoric and Irritable  Affect:  Labile  Thought Process:  Coherent and Goal Directed  Orientation:  Full (Time, Place, and Person)  Thought Content:  Paranoid Ideation and Rumination  Suicidal Thoughts:  Yes.  without  intent/plan  Homicidal Thoughts:  No  Memory:  Immediate;   Fair Recent;   Fair Remote;   Fair  Judgement:  Fair  Insight:  Present and Shallow  Psychomotor Activity:  Restlessness  Concentration:  Fair  Recall:  [employer] of Knowledge:Fair  Language: Fair  Akathisia:  No  Handed:  Right  AIMS (if indicated):     Assets:  Desire for Improvement  ADL's:  Intact  Cognition: WNL  Sleep:  Number of Hours: 3.5     Current Medications: Current Facility-Administered Medications  Medication Dose Route Frequency Provider Last Rate Last Dose  . acetaminophen (TYLENOL) tablet 650 mg  650 mg Oral Q6H PRN Charm Rings, NP      . albuterol (PROVENTIL HFA;VENTOLIN HFA) 108 (90 BASE) MCG/ACT inhaler 2 puff  2 puff Inhalation Q6H PRN Rachael Fee, MD   2 puff at 09/17/14 [phone]  . alum & mag hydroxide-simeth (MAALOX/MYLANTA) 200-200-20 MG/5ML suspension 30 mL  30 mL Oral Q4H PRN Charm Rings, NP      . ARIPiprazole (ABILIFY) tablet 5 mg  5 mg Oral BID Rachael Fee, MD   5 mg at 09/17/14 9604  . citalopram (CELEXA) tablet 20 mg  20 mg Oral Daily Charm Rings, NP   20 mg at 09/17/14 5409  . cloNIDine (CATAPRES) tablet 0.1 mg  0.1 mg Oral BH-qamhs Shuvon B Rankin, NP   0.1 mg at 09/17/14 8119   Followed by  . [START ON 09/18/2014] cloNIDine (CATAPRES) tablet 0.1 mg  0.1 mg Oral QAC breakfast Shuvon B Rankin, NP      . dicyclomine (BENTYL) tablet 20 mg  20 mg Oral Q6H PRN Shuvon B Rankin, NP   20 mg at 09/14/14 0838  . docusate sodium (COLACE) capsule 100 mg  100 mg Oral BID PRN Adonis Brook, NP   100 mg at 09/16/14 1205  . feeding supplement (RESOURCE BREEZE) (RESOURCE BREEZE) liquid 1 Container  1 Container Oral BID BM Normand Sloop, RD   1 Container at 09/15/14 1545  . gabapentin (NEURONTIN) capsule 600 mg  600 mg Oral TID Rachael Fee, MD   600 mg at 09/17/14 1478  . hydrOXYzine (ATARAX/VISTARIL) tablet 25 mg  25 mg Oral Q6H PRN Shuvon B Rankin, NP      . insulin aspart (novoLOG)  injection 0-15 Units  0-15 Units Subcutaneous TID WC Kerry Hough, PA-C   3 Units at 09/16/14 1644  . insulin glargine (LANTUS) injection 30 Units  30 Units Subcutaneous QHS Kerry Hough, PA-C   30 Units at 09/16/14 2200  . lisinopril (PRINIVIL,ZESTRIL) tablet 10 mg  10 mg Oral Daily Charm Rings, NP   10 mg at 09/17/14 2956  . lithium carbonate capsule 300 mg  300 mg Oral Daily Charm Rings, NP  300 mg at 09/17/14 0812  . lithium carbonate capsule 600 mg  600 mg Oral QHS Charm Rings, NP   600 mg at 09/16/14 2148  . loperamide (IMODIUM) capsule 2-4 mg  2-4 mg Oral PRN Shuvon B Rankin, NP      . magnesium hydroxide (MILK OF MAGNESIA) suspension 30 mL  30 mL Oral Daily PRN Charm Rings, NP   30 mL at 09/15/14 0829  . methocarbamol (ROBAXIN) tablet 500 mg  500 mg Oral Q8H PRN Shuvon B Rankin, NP   500 mg at 09/17/14 0818  . multivitamin with minerals tablet 1 tablet  1 tablet Oral Daily Shuvon B Rankin, NP   1 tablet at 09/17/14 0810  . naproxen (NAPROSYN) tablet 500 mg  500 mg Oral BID PRN Shuvon B Rankin, NP   500 mg at 09/14/14 0838  . nicotine (NICODERM CQ - dosed in mg/24 hr) patch 7 mg  7 mg Transdermal Daily Rachael Fee, MD   7 mg at 09/17/14 0813  . ondansetron (ZOFRAN-ODT) disintegrating tablet 4 mg  4 mg Oral Q6H PRN Shuvon B Rankin, NP   4 mg at 09/15/14 1239  . oxymetazoline (AFRIN) 0.05 % nasal spray 1 spray  1 spray Each Nare BID PRN Adonis Brook, NP      . pantoprazole (PROTONIX) EC tablet 40 mg  40 mg Oral Daily Charm Rings, NP   40 mg at 09/17/14 0819  . rivaroxaban (XARELTO) tablet 20 mg  20 mg Oral Q supper Charm Rings, NP   20 mg at 09/16/14 1642  . thiamine (VITAMIN B-1) tablet 100 mg  100 mg Oral Daily Shuvon B Rankin, NP   100 mg at 09/17/14 0810  . tiotropium (SPIRIVA) inhalation capsule 18 mcg  18 mcg Inhalation Daily Rachael Fee, MD   18 mcg at 09/17/14 [phone]  . tiZANidine (ZANAFLEX) tablet 4 mg  4 mg Oral TID Rachael Fee, MD   4 mg at 09/17/14  [phone]  . traZODone (DESYREL) tablet 50 mg  50 mg Oral QHS PRN Cleotis Nipper, MD        Lab Results:  Results for orders placed or performed during the hospital encounter of 09/13/14 (from the past 48 hour(s))  Glucose, capillary     Status: Abnormal   Collection Time: 09/15/14 11:55 AM  Result Value Ref Range   Glucose-Capillary 153 (H) 70 - 99 mg/dL  Glucose, capillary     Status: Abnormal   Collection Time: 09/15/14  5:02 PM  Result Value Ref Range   Glucose-Capillary 115 (H) 70 - 99 mg/dL   Comment 1 Notify RN   Glucose, capillary     Status: Abnormal   Collection Time: 09/15/14  9:04 PM  Result Value Ref Range   Glucose-Capillary 160 (H) 70 - 99 mg/dL  Glucose, capillary     Status: None   Collection Time: 09/16/14  6:08 AM  Result Value Ref Range   Glucose-Capillary 90 70 - 99 mg/dL  Glucose, capillary     Status: Abnormal   Collection Time: 09/16/14 12:00 PM  Result Value Ref Range   Glucose-Capillary 110 (H) 70 - 99 mg/dL  Glucose, capillary     Status: Abnormal   Collection Time: 09/16/14  4:33 PM  Result Value Ref Range   Glucose-Capillary 145 (H) 70 - 99 mg/dL   Comment 1 Notify RN    Comment 2 Document in Chart   Glucose, capillary  Status: Abnormal   Collection Time: 09/16/14  8:54 PM  Result Value Ref Range   Glucose-Capillary 183 (H) 70 - 99 mg/dL  Glucose, capillary     Status: None   Collection Time: 09/17/14  5:51 AM  Result Value Ref Range   Glucose-Capillary 74 70 - 99 mg/dL   Comment 1 Notify RN    Comment 2 Document in Chart   Lithium level     Status: Abnormal   Collection Time: 09/17/14  6:14 AM  Result Value Ref Range   Lithium Lvl 0.63 (L) 0.80 - 1.40 mmol/L    Comment: [facility]    Physical Findings: AIMS: Facial and Oral Movements Muscles of Facial Expression: None, normal Lips and Perioral Area: None, normal Jaw: None, normal Tongue: None, normal,Extremity Movements Upper (arms, wrists, hands,  fingers): None, normal Lower (legs, knees, ankles, toes): None, normal, Trunk Movements Neck, shoulders, hips: None, normal, Overall Severity Severity of abnormal movements (highest score from questions above): None, normal Incapacitation due to abnormal movements: None, normal Patient's awareness of abnormal movements (rate only patient's report): No Awareness, Dental Status Current problems with teeth and/or dentures?: Yes (missing teeth) Does patient usually wear dentures?: No  CIWA:  CIWA-Ar Total: 2 COWS:  COWS Total Score: 3  Treatment Plan Summary: Daily contact with patient to assess and evaluate symptoms and progress in treatment and Medication management Supportive approach/coping skills/relapse prevention, CPT/mindfulness Depression: continue the Celexa 20 we'll add trazodone 50 mg at bedtime, I will also add low-dose Inderal 10 mg twice a day to help his blood pressure and anxiety symptoms. Psychotic symptoms: Continue Abilify to 5 mg BID Mood instability:  continue lithium.  His lithium level is 0.63 .   Pain: Continue Neurontin to 600 mg TID.    Medical Decision Making:  Review of Psycho-Social Stressors (1), Review or order clinical lab tests (1), Review of Last Therapy Session (1), Review of Medication Regimen & Side Effects (2) and Review of New Medication or Change in Dosage (2)     Billy [name]. 09/17/2014, 10:34 AM

## 2014-09-17 NOTE — Progress Notes (Signed)
Patient did not attend the evening speaker AA meeting. Pt remained in his room during most of the meeting.

## 2014-09-17 NOTE — Progress Notes (Addendum)
D)  Was lying on his bed in his room at the beginning of the shift, stated didn't want to attend group, doesn't like AA principles and didn't want to participate.  Stated was here because he had thoughts of suicide and he had come because of his counselor, and he had agreed to come for help.  Stated with his health problems and dealing with being homeless, it has been very hard.  [place] [facility] and [facility] had gotten him into [facility], but his water bill kept going up every month and so was his neighbors', but it was the complex increasing the bill, not the city.  Tried to fight it and was evicted.  Has trouble getting his meds, takes public transportation, was living in a motel with a girl who had drug problems, was glad she left.  Wants long term and to be around people who are clean, feels frustrated , that it will never happen, but contracts for safety. A)  Support, encouragement, will continue to monitor for safety, continue POC R)  Safety maintained.

## 2014-09-17 NOTE — Progress Notes (Signed)
Patient ID: Billy [name], male   DOB: 1960-10-06, 54 y.o.   MRN: [id number] Adult Psychoeducational Group Note  Date:  09/17/2014 Time:  09:30am  Group Topic/Focus:  Personal Choices and Values:   The focus of this group is to help patients assess and explore the importance of values in their lives, how their values affect their decisions, how they express their values and what opposes their expression.  Participation Level:  Active  Participation Quality:  Appropriate  Affect:  Flat  Cognitive:  Appropriate  Insight: Improving  Engagement in Group:  Engaged  Modes of Intervention:  Activity, Education, Exploration and Support  Additional Comments:  Pt able to express three positive supports systems and identify one value that has helped them on their journey to recovery.  Aurora Maskwyman, Jerime Arif E 09/17/2014, 11:20 AM

## 2014-09-17 NOTE — [facility] Group Notes (Signed)
[facility] Group Notes: (Clinical Social Work)   09/17/2014      Type of Therapy:  Group Therapy   Participation Level:  Did Not Attend despite MHT prompting   Quinterrius Errington Grossman-Orr, LCSW 09/17/2014, 4:48 PM     

## 2014-09-18 DIAGNOSIS — G894 Chronic pain syndrome: Secondary | ICD-10-CM

## 2014-09-18 DIAGNOSIS — F1410 Cocaine abuse, uncomplicated: Secondary | ICD-10-CM

## 2014-09-18 DIAGNOSIS — F1110 Opioid abuse, uncomplicated: Secondary | ICD-10-CM

## 2014-09-18 DIAGNOSIS — F1010 Alcohol abuse, uncomplicated: Secondary | ICD-10-CM

## 2014-09-18 DIAGNOSIS — F314 Bipolar disorder, current episode depressed, severe, without psychotic features: Secondary | ICD-10-CM

## 2014-09-18 LAB — GLUCOSE, CAPILLARY
GLUCOSE-CAPILLARY: 127 mg/dL — AB (ref 70–99)
GLUCOSE-CAPILLARY: 135 mg/dL — AB (ref 70–99)
Glucose-Capillary: 112 mg/dL — ABNORMAL HIGH (ref 70–99)
Glucose-Capillary: 239 mg/dL — ABNORMAL HIGH (ref 70–99)

## 2014-09-18 MED ORDER — LORAZEPAM 1 MG PO TABS
2.0000 mg | ORAL_TABLET | Freq: Every day | ORAL | Status: DC
  Administered 2014-09-18 – 2014-09-19 (×2): 2 mg via ORAL
  Filled 2014-09-18 (×2): qty 2

## 2014-09-18 MED ORDER — VALACYCLOVIR HCL 500 MG PO TABS
1000.0000 mg | ORAL_TABLET | Freq: Every day | ORAL | Status: DC
  Administered 2014-09-18 – 2014-09-20 (×3): 1000 mg via ORAL
  Filled 2014-09-18 (×5): qty 2
  Filled 2014-09-18: qty 28
  Filled 2014-09-18: qty 2

## 2014-09-18 MED ORDER — DOCUSATE SODIUM 100 MG PO CAPS
100.0000 mg | ORAL_CAPSULE | Freq: Two times a day (BID) | ORAL | Status: DC
  Administered 2014-09-18 – 2014-09-20 (×5): 100 mg via ORAL
  Filled 2014-09-18: qty 28
  Filled 2014-09-18 (×5): qty 1
  Filled 2014-09-18: qty 28
  Filled 2014-09-18 (×3): qty 1

## 2014-09-18 MED ORDER — ARIPIPRAZOLE 15 MG PO TABS
7.5000 mg | ORAL_TABLET | Freq: Two times a day (BID) | ORAL | Status: DC
  Administered 2014-09-18 – 2014-09-20 (×5): 7.5 mg via ORAL
  Filled 2014-09-18 (×3): qty 1
  Filled 2014-09-18 (×2): qty 14
  Filled 2014-09-18 (×6): qty 1

## 2014-09-18 MED ORDER — LORAZEPAM 1 MG PO TABS: 1.0000 mg | ORAL_TABLET | Freq: Every day | ORAL | Status: DC

## 2014-09-18 MED ORDER — MAGNESIUM CITRATE PO SOLN: 1.0000 | Freq: Once | ORAL | Status: DC

## 2014-09-18 MED ORDER — LISINOPRIL 20 MG PO TABS
20.0000 mg | ORAL_TABLET | Freq: Every day | ORAL | Status: DC
  Administered 2014-09-19 – 2014-09-21 (×3): 20 mg via ORAL
  Filled 2014-09-18: qty 14
  Filled 2014-09-18 (×4): qty 1

## 2014-09-18 MED ORDER — BACITRACIN-NEOMYCIN-POLYMYXIN OINTMENT TUBE
TOPICAL_OINTMENT | Freq: Two times a day (BID) | CUTANEOUS | Status: DC | PRN
  Administered 2014-09-18: 15:00:00 via TOPICAL
  Filled 2014-09-18 (×2): qty 15

## 2014-09-18 NOTE — Progress Notes (Signed)
D)  Has been out on the hall and in his room this evening, but refused to go to AA group.  Had fallen earlier today, but denies feeling light-headed when up and about.  Somewhat irritable this evening, tired.  States feels a little better this evening and less symptoms of w/d, but tired.  Went to bed shortly after taking hs meds. A)  Will continue to monitor for safety, NP made aware of bp and low HR R)  Remains safe on unit.

## 2014-09-18 NOTE — Progress Notes (Signed)
Patient ID: Billy [name], male   DOB: [birth year], 54 y.o.   MRN: [id number]  DAR: Pt. Denies HI and visual Hallucinations. Patient reports he does have auditory hallucinations. Patient reports SI that is chronic and constant but patient does contract for safety. Patient rates depression and hopelessness 9/10 and anxiety 8/10. Patient reports pain that is constant but does not want any PRN medications for this. Patient's BP remains high and pulse low. Writer informed MD and NP, an internalist came to see patient this afternoon. Patient reports sleep is poor, appetite is fair, energy level is low, and concentration level is poor. Support and encouragement provided to the patient. Scheduled medications administered to patient per physician's orders. Patient continues to be intrusive but appears less irritable and labile than when writer first met patient on his admission. Patient is seen in the milieu interacting with staff and peers. Patient is attending some groups. Q15 minute checks are maintained for safety.

## 2014-09-18 NOTE — Clinical Social Work Note (Signed)
Referral sent to [facility] at 09/18/14 at 11:30AM  [facility]eather Smart, LCSWA 09/18/2014 11:37 AM

## 2014-09-18 NOTE — H&P (Signed)
[facility] History and Physical  Billy [name] WJX:[id number] DOB: 10-16-1960 DOA: 09/13/2014  Referring physician: Dr Dub Mikes PCP: No PCP Per Patient   Chief Complaint: Consult for hypertension management  HPI: Billy [name] is a 54 y.o. male with past medical history of polysubstance abuse and hypertension. Has diabetes mellitus type 2, history of WPW, and HCV. [facility] because of hearing voices that tell him to kill himself.polysubstance abuse with UDS positive for benzos, cocaine and THC. [facility] consulted for hypertension management.  Thanks for the consultation, will follow-up with you in the morning.   Review of Systems:  Constitutional: negative for anorexia, fevers and sweats Eyes: negative for irritation, redness and visual disturbance Ears, nose, mouth, throat, and face: negative for earaches, epistaxis, nasal congestion and sore throat Respiratory: negative for cough, dyspnea on exertion, sputum and wheezing Cardiovascular: negative for chest pain, dyspnea, lower extremity edema, orthopnea, palpitations and syncope Gastrointestinal: negative for abdominal pain, constipation, diarrhea, melena, nausea and vomiting Genitourinary:negative for dysuria, frequency and hematuria Hematologic/lymphatic: negative for bleeding, easy bruising and lymphadenopathy Musculoskeletal:negative for arthralgias, muscle weakness and stiff joints Neurological: negative for coordination problems, gait problems, headaches and weakness Endocrine: negative for diabetic symptoms including polydipsia, polyuria and weight loss Allergic/Immunologic: negative for anaphylaxis, hay fever and urticaria  Past Medical History  Diagnosis Date  . Diabetes mellitus   . Hepatitis C carrier   . WPW (Wolff-Parkinson-White syndrome)   . Depression   . Chronic pain   . Suicide attempt   . Diabetes mellitus without complication   . Hypertension   . Bipolar 1  disorder   . HCV (hepatitis C virus)   . Homeless    Past Surgical History  Procedure Laterality Date  . Rhinoplasty    . Chest surgery    . Back surgery    . Tonsillectomy     Social History:   reports that he has been smoking Cigarettes.  He has a 2 pack-year smoking history. He does not have any smokeless tobacco history on file. He reports that he drinks alcohol. He reports that he uses illicit drugs (Marijuana and "Crack" cocaine).  No Known Allergies  History reviewed. No pertinent family history.    Prior to Admission medications   Medication Sig Start Date End Date Taking? Authorizing Provider  citalopram (CELEXA) 20 MG tablet Take 1 tablet by mouth daily. 07/06/14   Historical Provider, MD  gabapentin (NEURONTIN) 400 MG capsule Take 1 capsule (400 mg total) by mouth 3 (three) times daily. 09/12/13   Cathren Laine, MD  insulin glargine (LANTUS) 100 UNIT/ML injection Inject 50 Units into the skin at bedtime. Takes 40 units of lantus if blood sugar over 200    Historical Provider, MD  lisinopril (PRINIVIL,ZESTRIL) 10 MG tablet Take 10 mg by mouth daily.    Historical Provider, MD  lithium 300 MG tablet Take 1 tablet by mouth 2 (two) times daily.  in the morning, and  at bedtimes 07/06/14   Historical Provider, MD  omeprazole (PRILOSEC) 20 MG capsule Take 40 mg by mouth daily.    Historical Provider, MD  PROAIR HFA 108 (90 BASE) MCG/ACT inhaler Inhale 2 puffs into the lungs every 6 (six) hours as needed. Shortness of breath 07/06/14   Historical Provider, MD  rivaroxaban (XARELTO) 20 MG TABS tablet Take 20 mg by mouth daily with supper.    Historical Provider, MD  SPIRIVA RESPIMAT 2.5 MCG/ACT AERS Inhale 1 puff into the lungs daily.  07/06/14   Historical Provider, MD  traZODone (DESYREL) 100 MG tablet Take 2 tablets by mouth at bedtime. 07/06/14   Historical Provider, MD   Physical Exam: Filed Vitals:   09/18/14 1445  BP: 145/96  Pulse: 71  Temp: 98.3 F (36.8 C)  Resp: 16     Constitutional: Oriented to person, place, and time. Well-developed and well-nourished. Cooperative.  Head: Normocephalic and atraumatic.  Nose: Nose normal.  Mouth/Throat: Uvula is midline, oropharynx is clear and moist and mucous membranes are normal.  Eyes: Conjunctivae and EOM are normal. Pupils are equal, round, and reactive to light.  Neck: Trachea normal and normal range of motion. Neck supple.  Cardiovascular: Normal rate, regular rhythm, S1 normal, S2 normal, normal heart sounds and intact distal pulses.   Pulmonary/Chest: Effort normal and breath sounds normal.  Abdominal: Soft. Bowel sounds are normal. There is no hepatosplenomegaly. There is no tenderness.  Musculoskeletal: Normal range of motion.  Neurological: Alert and oriented to person, place, and time. Has normal strength. No cranial nerve deficit or sensory deficit.  Skin: Skin is warm, dry and intact.  Psychiatric: Has a normal mood and affect. Speech is normal and behavior is normal.   Labs on Admission:  Basic Metabolic Panel:  Recent Labs Lab 09/12/14 1754  NA 137  K 4.0  CL 104  CO2 21  GLUCOSE 207*  BUN 9  CREATININE 1.04  CALCIUM 9.3   Liver Function Tests:  Recent Labs Lab 09/12/14 1754  AST 56*  ALT 60*  ALKPHOS 57  BILITOT 1.2  PROT 8.3  ALBUMIN 4.4   No results for input(s): LIPASE, AMYLASE in the last 168 hours. No results for input(s): AMMONIA in the last 168 hours. CBC:  Recent Labs Lab 09/12/14 1754  WBC 9.3  HGB 19.3*  HCT 55.2*  MCV 96.8  PLT 150   Cardiac Enzymes: No results for input(s): CKTOTAL, CKMB, CKMBINDEX, TROPONINI in the last 168 hours.  BNP (last 3 results) No results for input(s): BNP in the last 8760 hours.  ProBNP (last 3 results) No results for input(s): PROBNP in the last 8760 hours.  CBG:  Recent Labs Lab 09/17/14 1636 09/17/14 1955 09/17/14 2107 09/18/14 0526 09/18/14 1137  GLUCAP 238* 98 178* 112* 127*    Radiological Exams on  Admission: No results found.  EKG: Independently reviewed. EKG from 09/14/2014 showed marketed sinus bradycardia with heart rate 41.  Assessment/Plan Principal Problem:   Bipolar affective disorder, depressed, severe, with psychotic behavior Active Problems:   Opiate abuse, episodic   Alcohol abuse   Cocaine abuse   Chronic pain syndrome    Hypertension Poorly controlled hypertension, patient is on lisinopril 10 mg and clonidine for his withdrawal symptoms. Increase lisinopril to 20 mg by mouth daily. Avoid beta blockers as patient has history of cocaine abuse.  Bradycardia, sinus Heart rate was in the 40s on the 14th, avoid beta and other AV nodal blocking medications. Patient currently clonidine for withdrawal symptoms, please avoid clonidine as long-term as he has bradycardia.  Diabetes mellitus type 2 Appears to be controlled, continue home medications Lantus insulin. Hemoglobin A1c is 6.7 indicating tight glycemic control. Intuniv current regimen.  Depression Per primary service, patient also appears to have psychotic symptoms with auditory hallucinations. Started on Celexa, trazodone, Abilify and lithium.  Polysubstance abuse Per primary service, positive for THC, benzos and cocaine.    Time spent: 70 minutes  Shalia Bartko A, MD [facility] Pager [phone]

## 2014-09-18 NOTE — Progress Notes (Signed)
Pt attended spiritual care group on grief and loss facilitated by chaplain Burnis KingfisherMatthew Bralynn Donado. Group opened with brief discussion and psycho-social ed around grief and loss in relationships and in relation to self - identifying life patterns, circumstances, changes that cause losses. Established group norm of speaking from own life experience. Group goal of establishing open and affirming space for members to share loss and experience with grief, normalize grief experience and provide psycho social education and grief support.  Group drew on narrative and Alderian therapeutic modalities.   Billy [name] was present throughout half of group.  While present, he was attentive as other group members spoke about processes of coping with loss in their lives.   Prior to group, Billy [name] reported to chaplain that he had experienced "some losses."  During group, Billy [name] stated that he was "burning up" and left group room.  He did not return to group today.    Billy [name], Billy [name] MDiv

## 2014-09-18 NOTE — [facility] Group Notes (Signed)
[facility] LCSW Group Therapy  09/18/2014 1:22 PM  Type of Therapy:  Group Therapy  Participation Level:  Active  Participation Quality:  Attentive  Affect:  Appropriate  Cognitive:  Alert and Oriented  Insight:  Limited  Engagement in Therapy:  Improving  Modes of Intervention:  Confrontation, Discussion, Education, Exploration, Problem-solving, Rapport Building, Socialization and Support  Summary of Progress/Problems: Today's Topic: Overcoming Obstacles. Pt identified obstacles faced currently and processed barriers involved in overcoming these obstacles. Pt identified steps necessary for overcoming these obstacles and explored motivation (internal and external) for facing these difficulties head on. Pt further identified one area of concern in their lives and chose a skill of focus pulled from their "toolbox." Billy [name] was attentive and engaged during today's processing group. He shared that his biggest obstacle involves getting directly admitted to a treatment facility. Pt reports he is on muscle relaxers, which greatly limites his options. Pt was given information to [facility] and stated that he may be interested in this facility. "But can I smoke some weed on the trip there?" Pt continues to joke about substance use but is redirectable by CSW. Pt asking several questions about facilities "I'm smart, I know to ask a lot of questions."     Smart, Lauramae Kneisley LCSWA  09/18/2014, 1:22 PM

## 2014-09-18 NOTE — Progress Notes (Signed)
[facility]  09/18/2014 7:02 PM Billy [name]  MRN:  [id number] Subjective:  Billy [name] is still having "mood swings." He is still having hallucinations but states they seem to be getting better. He is not sure of what to do after he gets out of here. His BP is still very high, he feels he is coming down with an Herpes flare up.  Principal Problem: Bipolar affective disorder, depressed, severe, with psychotic behavior Diagnosis:   Patient Active Problem List   Diagnosis Date Noted  . Bipolar affective disorder, depressed, severe, with psychotic behavior [F31.5] 09/14/2014    Priority: High  . Chronic pain syndrome [G89.4] 09/14/2014    Priority: High  . Cocaine abuse [F14.10] 09/13/2014    Priority: High  . Alcohol abuse [F10.10] 10/12/2012    Priority: High  . Opiate abuse, episodic [F11.10] 09/02/2011    Priority: High  . Alcohol dependence with uncomplicated withdrawal [F10.230] 09/13/2014  . Homeless [Z59.0] 09/02/2011  . Diabetes mellitus [250] 08/30/2011  . WPW (Wolff-Parkinson-White syndrome) [I45.6] 08/30/2011   Total Time spent with patient: 30 minutes   Past Medical History:  Past Medical History  Diagnosis Date  . Diabetes mellitus   . Hepatitis C carrier   . WPW (Wolff-Parkinson-White syndrome)   . Depression   . Chronic pain   . Suicide attempt   . Diabetes mellitus without complication   . Hypertension   . Bipolar 1 disorder   . HCV (hepatitis C virus)   . Homeless     Past Surgical History  Procedure Laterality Date  . Rhinoplasty    . Chest surgery    . Back surgery    . Tonsillectomy     Family History: History reviewed. No pertinent family history. Social History:  History  Alcohol Use  . Yes    Comment: heavy     History  Drug Use  . Yes  . Special: Marijuana, "Crack" cocaine    History   Social History  . Marital Status: Divorced    Spouse Name: N/A  . Number of Children: N/A  . Years of Education: N/A   Social History  Main Topics  . Smoking status: Current Every Day Smoker -- 0.25 packs/day for 8 years    Types: Cigarettes  . Smokeless tobacco: Not on file  . Alcohol Use: Yes     Comment: heavy  . Drug Use: Yes    Special: Marijuana, "Crack" cocaine  . Sexual Activity: Not on file   Other Topics Concern  . None   Social History Narrative   ** Merged History Encounter **       Additional History:    Sleep: interrupted claims because of his roomate  Appetite:  Fair   Assessment:   Musculoskeletal: Strength & Muscle Tone: within normal limits Gait & Station: normal Patient leans: N/A   Psychiatric Specialty Exam: Physical Exam  Review of Systems  Constitutional: Negative.   HENT: Negative.   Eyes: Negative.   Respiratory: Negative.   Cardiovascular: Negative.   Gastrointestinal: Negative.   Genitourinary: Negative.   Musculoskeletal: Positive for back pain.  Skin: Negative.   Neurological: Negative.   Endo/Heme/Allergies: Negative.   Psychiatric/Behavioral: Positive for depression, hallucinations and substance abuse. The patient is nervous/anxious.     Blood pressure 155/106, pulse 82, temperature 98.2 F (36.8 C), temperature source Oral, resp. rate 18, height 5\' 11"  (1.803 m), weight 89.812 kg (198 lb), SpO2 99 %.Body mass index is 27.63 kg/(m^2).  General  Appearance: Fairly Groomed  Patent attorney::  Fair  Speech:  Clear and Coherent  Volume:  fluctuates  Mood:  Anxious, Depressed and worried  Affect:  anxious worried  Thought Process:  Coherent and Goal Directed  Orientation:  Full (Time, Place, and Person)  Thought Content:  symtpoms events worries concerns  Suicidal Thoughts:  No  Homicidal Thoughts:  No  Memory:  Immediate;   Fair Recent;   Fair Remote;   Fair  Judgement:  Fair  Insight:  Present  Psychomotor Activity:  Restlessness  Concentration:  Fair  Recall:  [employer] of Knowledge:Fair  Language: Fair  Akathisia:  No  Handed:  Right  AIMS (if  indicated):     Assets:  Desire for Improvement  ADL's:  Intact  Cognition: WNL  Sleep:  Number of Hours: 6.25     Current Medications: Current Facility-Administered Medications  Medication Dose Route Frequency Provider Last Rate Last Dose  . acetaminophen (TYLENOL) tablet 650 mg  650 mg Oral Q6H PRN Charm Rings, NP      . albuterol (PROVENTIL HFA;VENTOLIN HFA) 108 (90 BASE) MCG/ACT inhaler 2 puff  2 puff Inhalation Q6H PRN Rachael Fee, MD   2 puff at 09/18/14 1849  . alum & mag hydroxide-simeth (MAALOX/MYLANTA) 200-200-20 MG/5ML suspension 30 mL  30 mL Oral Q4H PRN Charm Rings, NP      . ARIPiprazole (ABILIFY) tablet 7.5 mg  7.5 mg Oral BID Rachael Fee, MD   7.5 mg at 09/18/14 1731  . citalopram (CELEXA) tablet 20 mg  20 mg Oral Daily Charm Rings, NP   20 mg at 09/18/14 0865  . cloNIDine (CATAPRES) tablet 0.1 mg  0.1 mg Oral QAC breakfast Shuvon B Rankin, NP   0.1 mg at 09/18/14 0812  . docusate sodium (COLACE) capsule 100 mg  100 mg Oral BID Rachael Fee, MD   100 mg at 09/18/14 1731  . feeding supplement (RESOURCE BREEZE) (RESOURCE BREEZE) liquid 1 Container  1 Container Oral BID BM Normand Sloop, RD   1 Container at 09/15/14 1545  . gabapentin (NEURONTIN) capsule 600 mg  600 mg Oral TID Rachael Fee, MD   600 mg at 09/18/14 1731  . insulin aspart (novoLOG) injection 0-15 Units  0-15 Units Subcutaneous TID WC Kerry Hough, PA-C   2 Units at 09/18/14 1730  . insulin glargine (LANTUS) injection 30 Units  30 Units Subcutaneous QHS Kerry Hough, PA-C   30 Units at 09/17/14 2157  . [START ON 09/19/2014] lisinopril (PRINIVIL,ZESTRIL) tablet 20 mg  20 mg Oral Daily Clydia Llano, MD      . lithium carbonate capsule 300 mg  300 mg Oral Daily Charm Rings, NP   300 mg at 09/18/14 7846  . lithium carbonate capsule 600 mg  600 mg Oral QHS Charm Rings, NP   600 mg at 09/17/14 2130  . LORazepam (ATIVAN) tablet 2 mg  2 mg Oral QHS Rachael Fee, MD      . magnesium citrate  solution 1 Bottle  1 Bottle Oral Once Rachael Fee, MD      . magnesium hydroxide (MILK OF MAGNESIA) suspension 30 mL  30 mL Oral Daily PRN Charm Rings, NP   30 mL at 09/18/14 0855  . multivitamin with minerals tablet 1 tablet  1 tablet Oral Daily Shuvon B Rankin, NP   1 tablet at 09/18/14 0813  . neomycin-bacitracin-polymyxin (NEOSPORIN) ointment  Topical BID PRN Sanjuana Kava, NP      . nicotine (NICODERM CQ - dosed in mg/24 hr) patch 7 mg  7 mg Transdermal Daily Rachael Fee, MD   7 mg at 09/18/14 2956  . oxymetazoline (AFRIN) 0.05 % nasal spray 1 spray  1 spray Each Nare BID PRN Adonis Brook, NP   1 spray at 09/18/14 1849  . pantoprazole (PROTONIX) EC tablet 40 mg  40 mg Oral Daily Charm Rings, NP   40 mg at 09/18/14 0813  . rivaroxaban (XARELTO) tablet 20 mg  20 mg Oral Q supper Charm Rings, NP   20 mg at 09/18/14 1731  . thiamine (VITAMIN B-1) tablet 100 mg  100 mg Oral Daily Shuvon B Rankin, NP   100 mg at 09/18/14 0813  . tiotropium (SPIRIVA) inhalation capsule 18 mcg  18 mcg Inhalation Daily Rachael Fee, MD   18 mcg at 09/18/14 [phone]  . tiZANidine (ZANAFLEX) tablet 4 mg  4 mg Oral TID Rachael Fee, MD   4 mg at 09/18/14 1731  . traZODone (DESYREL) tablet 50 mg  50 mg Oral QHS PRN Cleotis Nipper, MD   50 mg at 09/17/14 2131  . valACYclovir (VALTREX) tablet 1,000 mg  1,000 mg Oral Daily Rachael Fee, MD   1,000 mg at 09/18/14 1731    Lab Results:  Results for orders placed or performed during the hospital encounter of 09/13/14 (from the past 48 hour(s))  Glucose, capillary     Status: Abnormal   Collection Time: 09/16/14  8:54 PM  Result Value Ref Range   Glucose-Capillary 183 (H) 70 - 99 mg/dL  Glucose, capillary     Status: None   Collection Time: 09/17/14  5:51 AM  Result Value Ref Range   Glucose-Capillary 74 70 - 99 mg/dL   Comment 1 Notify RN    Comment 2 Document in Chart   Lithium level     Status: Abnormal   Collection Time: 09/17/14  6:14 AM  Result Value  Ref Range   Lithium Lvl 0.63 (L) 0.80 - 1.40 mmol/L    Comment: Performed at [facility]  Glucose, capillary     Status: Abnormal   Collection Time: 09/17/14 11:55 AM  Result Value Ref Range   Glucose-Capillary 100 (H) 70 - 99 mg/dL   Comment 1 Notify RN    Comment 2 Document in Chart   Glucose, capillary     Status: Abnormal   Collection Time: 09/17/14  4:36 PM  Result Value Ref Range   Glucose-Capillary 238 (H) 70 - 99 mg/dL   Comment 1 Notify RN    Comment 2 Document in Chart   Glucose, capillary     Status: None   Collection Time: 09/17/14  7:55 PM  Result Value Ref Range   Glucose-Capillary 98 70 - 99 mg/dL  Glucose, capillary     Status: Abnormal   Collection Time: 09/17/14  9:07 PM  Result Value Ref Range   Glucose-Capillary 178 (H) 70 - 99 mg/dL   Comment 1 Notify RN    Comment 2 Document in Chart   Glucose, capillary     Status: Abnormal   Collection Time: 09/18/14  5:26 AM  Result Value Ref Range   Glucose-Capillary 112 (H) 70 - 99 mg/dL   Comment 1 Notify RN    Comment 2 Document in Chart   Glucose, capillary     Status: Abnormal   Collection  Time: 09/18/14 11:37 AM  Result Value Ref Range   Glucose-Capillary 127 (H) 70 - 99 mg/dL   Comment 1 Notify RN   Glucose, capillary     Status: Abnormal   Collection Time: 09/18/14  4:32 PM  Result Value Ref Range   Glucose-Capillary 135 (H) 70 - 99 mg/dL    Physical Findings: AIMS: Facial and Oral Movements Muscles of Facial Expression: None, normal Lips and Perioral Area: None, normal Jaw: None, normal Tongue: None, normal,Extremity Movements Upper (arms, wrists, hands, fingers): None, normal Lower (legs, knees, ankles, toes): None, normal, Trunk Movements Neck, shoulders, hips: None, normal, Overall Severity Severity of abnormal movements (highest score from questions above): None, normal Incapacitation due to abnormal movements: None, normal Patient's awareness of abnormal movements (rate  only patient's report): No Awareness, Dental Status Current problems with teeth and/or dentures?: Yes (missing teeth) Does patient usually wear dentures?: No  CIWA:  CIWA-Ar Total: 1 COWS:  COWS Total Score: 4  Treatment Plan Summary: Daily contact with patient to assess and evaluate symptoms and progress in treatment and Medication management Supportive approach/coping skills Cocaine abuse/alcohol dependence/ work a relapse prevention plan Mood instability will continue to work on his mood stabilizers; consider increasing the Lithium  Psychotic symptoms: will continue the Abilify at 7.5 mg BID as there is some benefit Will restart him on Valtrex 1000 mg daily as he states he feels he will have a flare up Constipation; magnesium citrate Hypertension; will consult internal medicine to help better manage  Medical Decision Making:  Review of Psycho-Social Stressors (1), Review or order clinical lab tests (1), Review of Medication Regimen & Side Effects (2) and Review of New Medication or Change in Dosage (2)     Bess Saltzman A 09/18/2014, 7:02 PM

## 2014-09-18 NOTE — Progress Notes (Signed)
Patient ID: Billy [name], male   DOB: Oct 06, 1960, 54 y.o.   MRN: [id number] PER STATE REGULATIONS 482.30  THIS CHART WAS REVIEWED FOR MEDICAL NECESSITY WITH RESPECT TO THE PATIENT'S ADMISSION/ DURATION OF STAY.  NEXT REVIEW DATE: 09/21/2014 Willa RoughJENNIFER JONES Siddiq Kaluzny, RN, BSN CASE MANAGER

## 2014-09-18 NOTE — Progress Notes (Signed)
Recreation Therapy Notes  Date: 04.18.2016 Time: 9:30am Location: [address] Group Room   Group Topic: Stress Management  Goal Area(s) Addresses:  Patient will actively participate in stress management techniques presented during session.   Behavioral Response: Did not attend.   Billy [name], LRT/CTRS        Billy [name] 09/18/2014 3:18 PM 

## 2014-09-18 NOTE — Plan of Care (Signed)
Problem: Diagnosis: Increased Risk For Suicide Attempt Goal: STG-Patient Will Report Suicidal Feelings to Staff Outcome: Progressing Patient reports suicidal feelings and can contract for safety.

## 2014-09-18 NOTE — [facility] Group Notes (Signed)
[facility]   09/18/2014 9:29 AM  Participation Quality:  Appropriate   Mood/Affect:  Appropriate  Depression Rating:  8-9  Anxiety Rating:  8-9  Thoughts of Suicide:  No Will you contract for safety?   NA  Current AVH:  No  Plan for Discharge/Comments: Pt reports that his pain is high "but manageable." Pt reports no withdrawals, poor sleep. Pt has PCP appt for med management and [facility] "but I want inpatient treatment now." CSW let pt know that pain meds will inhibit his chances of getting into treatment. Pt stated he is on muscle relaxer only. Pt given [facility] list and halfway house info on Friday and was encouraged to start calling. CSW assessing-will call [facility] for screening.    Transportation Means: bus?   Supports: none identified by pt.   Smart, American FinancialHeather LCSWA

## 2014-09-18 NOTE — Clinical Social Work Note (Signed)
CSW contacted [facility] in attempt to complete intake screening and review meds for possbile admission. MRN: [id number]. Per intake receptionist, a counselor will call back today to complete screening with CSW.  The Sherwin-WilliamsHeather Smart, LCSWA  09/18/2014 10:58 AM

## 2014-09-19 DIAGNOSIS — E1165 Type 2 diabetes mellitus with hyperglycemia: Secondary | ICD-10-CM

## 2014-09-19 DIAGNOSIS — I82401 Acute embolism and thrombosis of unspecified deep veins of right lower extremity: Secondary | ICD-10-CM

## 2014-09-19 DIAGNOSIS — I10 Essential (primary) hypertension: Secondary | ICD-10-CM

## 2014-09-19 LAB — GLUCOSE, CAPILLARY
GLUCOSE-CAPILLARY: 129 mg/dL — AB (ref 70–99)
GLUCOSE-CAPILLARY: 164 mg/dL — AB (ref 70–99)
GLUCOSE-CAPILLARY: 137 mg/dL — AB (ref 70–99)
Glucose-Capillary: 120 mg/dL — ABNORMAL HIGH (ref 70–99)

## 2014-09-19 LAB — BASIC METABOLIC PANEL
ANION GAP: 7 (ref 5–15)
BUN: 15 mg/dL (ref 6–23)
CALCIUM: 10 mg/dL (ref 8.4–10.5)
CO2: 28 mmol/L (ref 19–32)
Chloride: 105 mmol/L (ref 96–112)
Creatinine, Ser: 1.34 mg/dL (ref 0.50–1.35)
GFR calc Af Amer: 69 mL/min — ABNORMAL LOW (ref >90)
GFR, EST NON AFRICAN AMERICAN: 59 mL/min — AB (ref >90)
Glucose, Bld: 128 mg/dL — ABNORMAL HIGH (ref 70–99)
Potassium: 4.9 mmol/L (ref 3.5–5.1)
Sodium: 140 mmol/L (ref 135–145)

## 2014-09-19 MED ORDER — AMLODIPINE BESYLATE 5 MG PO TABS
5.0000 mg | ORAL_TABLET | Freq: Every day | ORAL | Status: DC
  Administered 2014-09-19 – 2014-09-20 (×2): 5 mg via ORAL
  Filled 2014-09-19 (×2): qty 1
  Filled 2014-09-19: qty 14
  Filled 2014-09-19 (×2): qty 1

## 2014-09-19 MED ORDER — HYDRALAZINE HCL 10 MG PO TABS
10.0000 mg | ORAL_TABLET | Freq: Four times a day (QID) | ORAL | Status: DC
  Administered 2014-09-19: 10 mg via ORAL
  Filled 2014-09-19 (×2): qty 1

## 2014-09-19 MED ORDER — HYDRALAZINE HCL 10 MG PO TABS
10.0000 mg | ORAL_TABLET | ORAL | Status: DC | PRN
  Administered 2014-09-19 – 2014-09-21 (×3): 10 mg via ORAL
  Filled 2014-09-19 (×4): qty 1

## 2014-09-19 NOTE — Progress Notes (Signed)
Patient up and visible on unit. Affect appropriate with congruent mood however patient rates his depression and hopelessness at a 6/10 and anxiety at an 8/10. Reports [facility] goal [facility] Only physical complaint is his chronic back pain of a 8-9/10. Medicated per orders, offered support and encouragement. Med education provided. Patient refusing pain med options stating the pain never really decreases. Does report the xaneflex helps with his spasms. While he has endorsed passive SI as recently as last night, he denies this morning. No HI/AVH. Patient remains safe. Lawrence MarseillesFriedman, Jaquitta Dupriest Eakes

## 2014-09-19 NOTE — [facility] Group Notes (Signed)
Adult Psychoeducational Group Note  Date:  09/19/2014 Time:  10:40 PM  Group Topic/Focus:  Wrap-Up Group:   The focus of this group is to help patients review their daily goal of treatment and discuss progress on daily workbooks.  Participation Level:  Active  Participation Quality:  Appropriate  Affect:  Appropriate  Cognitive:  Appropriate  Insight: Appropriate  Engagement in Group:  Engaged  Modes of Intervention:  Discussion  Additional Comments:  [name] stated his day was pretty good.  [facility].  He uses cooking and walking as a coping skills.  Caroll RancherLindsay, Ledell Codrington A 09/19/2014, 10:40 PM

## 2014-09-19 NOTE — Progress Notes (Signed)
[facility]  09/19/2014 5:39 PM Billy [name]  MRN:  [id number] Subjective:  Billy MoneyMelvin' sBP medications were adjusted. He is getting better readings right now. [facility] was [facility] Thursday AM. He was asked to come off the muscle relaxants. He states that although these medications help he is willing to do whatever he deeds to do as states he is afraid of relapsing. He states he really needs the help. States he will not be able to handle another relapse Principal Problem: Bipolar affective disorder, depressed, severe, with psychotic behavior Diagnosis:   Patient Active Problem List   Diagnosis Date Noted  . Bipolar affective disorder, depressed, severe, with psychotic behavior [F31.5] 09/14/2014    Priority: High  . Chronic pain syndrome [G89.4] 09/14/2014    Priority: High  . Cocaine abuse [F14.10] 09/13/2014    Priority: High  . Alcohol abuse [F10.10] 10/12/2012    Priority: High  . Opiate abuse, episodic [F11.10] 09/02/2011    Priority: High  . Alcohol dependence with uncomplicated withdrawal [F10.230] 09/13/2014  . Homeless [Z59.0] 09/02/2011  . Diabetes mellitus [250] 08/30/2011  . WPW (Wolff-Parkinson-White syndrome) [I45.6] 08/30/2011   Total Time spent with patient: 30 minutes   Past Medical History:  Past Medical History  Diagnosis Date  . Diabetes mellitus   . Hepatitis C carrier   . WPW (Wolff-Parkinson-White syndrome)   . Depression   . Chronic pain   . Suicide attempt   . Diabetes mellitus without complication   . Hypertension   . Bipolar 1 disorder   . HCV (hepatitis C virus)   . Homeless     Past Surgical History  Procedure Laterality Date  . Rhinoplasty    . Chest surgery    . Back surgery    . Tonsillectomy     Family History: History reviewed. No pertinent family history. Social History:  History  Alcohol Use  . Yes    Comment: heavy     History  Drug Use  . Yes  . Special: Marijuana, "Crack" cocaine    History   Social  History  . Marital Status: Divorced    Spouse Name: N/A  . Number of Children: N/A  . Years of Education: N/A   Social History Main Topics  . Smoking status: Current Every Day Smoker -- 0.25 packs/day for 8 years    Types: Cigarettes  . Smokeless tobacco: Not on file  . Alcohol Use: Yes     Comment: heavy  . Drug Use: Yes    Special: Marijuana, "Crack" cocaine  . Sexual Activity: Not on file   Other Topics Concern  . None   Social History Narrative   ** Merged History Encounter **       Additional History:    Sleep: Fair  Appetite:  Fair   Assessment:   Musculoskeletal: Strength & Muscle Tone: within normal limits Gait & Station: normal Patient leans: N/A   Psychiatric Specialty Exam: Physical Exam  Review of Systems  Constitutional: Negative.   HENT: Negative.   Eyes: Negative.   Respiratory: Negative.   Cardiovascular: Negative.   Gastrointestinal: Negative.   Genitourinary: Negative.   Musculoskeletal: Positive for back pain.  Skin: Negative.   Neurological: Negative.   Endo/Heme/Allergies: Negative.   Psychiatric/Behavioral: Positive for substance abuse. The patient is nervous/anxious.     Blood pressure 134/88, pulse 60, temperature 97.8 F (36.6 C), temperature source Oral, resp. rate 18, height 5\' 11"  (1.803 m), weight 89.812 kg (198  lb), SpO2 99 %.Body mass index is 27.63 kg/(m^2).  General Appearance: Fairly Groomed  Patent attorney::  Fair  Speech:  Clear and Coherent  Volume:  fluctuates  Mood:  Anxious  Affect:  Tearful and anxious worried  Thought Process:  Coherent and Goal Directed  Orientation:  Full (Time, Place, and Person)  Thought Content:  symptoms events worries concerns  Suicidal Thoughts:  No  Homicidal Thoughts:  No  Memory:  Immediate;   Fair Recent;   Fair Remote;   Fair  Judgement:  Fair  Insight:  Present  Psychomotor Activity:  Restlessness  Concentration:  Fair  Recall:  [employer] of Knowledge:Fair  Language: Fair   Akathisia:  No  Handed:  Right  AIMS (if indicated):     Assets:  Desire for Improvement  ADL's:  Intact  Cognition: WNL  Sleep:  Number of Hours: 6     Current Medications: Current Facility-Administered Medications  Medication Dose Route Frequency Provider Last Rate Last Dose  . acetaminophen (TYLENOL) tablet 650 mg  650 mg Oral Q6H PRN Charm Rings, NP   650 mg at 09/18/14 2048  . albuterol (PROVENTIL HFA;VENTOLIN HFA) 108 (90 BASE) MCG/ACT inhaler 2 puff  2 puff Inhalation Q6H PRN Rachael Fee, MD   2 puff at 09/18/14 1849  . alum & mag hydroxide-simeth (MAALOX/MYLANTA) 200-200-20 MG/5ML suspension 30 mL  30 mL Oral Q4H PRN Charm Rings, NP      . amLODipine (NORVASC) tablet 5 mg  5 mg Oral Daily Catarina Hartshorn, MD   5 mg at 09/19/14 1513  . ARIPiprazole (ABILIFY) tablet 7.5 mg  7.5 mg Oral BID Rachael Fee, MD   7.5 mg at 09/19/14 1657  . citalopram (CELEXA) tablet 20 mg  20 mg Oral Daily Charm Rings, NP   20 mg at 09/19/14 0813  . docusate sodium (COLACE) capsule 100 mg  100 mg Oral BID Rachael Fee, MD   100 mg at 09/19/14 1656  . feeding supplement (RESOURCE BREEZE) (RESOURCE BREEZE) liquid 1 Container  1 Container Oral BID BM Normand Sloop, RD   1 Container at 09/15/14 1545  . gabapentin (NEURONTIN) capsule 600 mg  600 mg Oral TID Rachael Fee, MD   600 mg at 09/19/14 1656  . hydrALAZINE (APRESOLINE) tablet 10 mg  10 mg Oral Q4H PRN Kerry Hough, PA-C   10 mg at 09/19/14 1914  . insulin aspart (novoLOG) injection 0-15 Units  0-15 Units Subcutaneous TID WC Kerry Hough, PA-C   3 Units at 09/19/14 1700  . insulin glargine (LANTUS) injection 30 Units  30 Units Subcutaneous QHS Kerry Hough, PA-C   30 Units at 09/18/14 2145  . lisinopril (PRINIVIL,ZESTRIL) tablet 20 mg  20 mg Oral Daily Clydia Llano, MD   20 mg at 09/19/14 0813  . lithium carbonate capsule 300 mg  300 mg Oral Daily Charm Rings, NP   300 mg at 09/19/14 0813  . lithium carbonate capsule 600 mg  600  mg Oral QHS Charm Rings, NP   600 mg at 09/18/14 2144  . LORazepam (ATIVAN) tablet 2 mg  2 mg Oral QHS Rachael Fee, MD   2 mg at 09/18/14 2144  . magnesium citrate solution 1 Bottle  1 Bottle Oral Once Rachael Fee, MD   1 Bottle at 09/19/14 0800  . magnesium hydroxide (MILK OF MAGNESIA) suspension 30 mL  30 mL Oral Daily PRN Catha Nottingham  Kennyth Lose, NP   30 mL at 09/18/14 0855  . multivitamin with minerals tablet 1 tablet  1 tablet Oral Daily Shuvon B Rankin, NP   1 tablet at 09/19/14 0813  . neomycin-bacitracin-polymyxin (NEOSPORIN) ointment   Topical BID PRN Sanjuana Kava, NP      . nicotine (NICODERM CQ - dosed in mg/24 hr) patch 7 mg  7 mg Transdermal Daily Rachael Fee, MD   7 mg at 09/19/14 0817  . oxymetazoline (AFRIN) 0.05 % nasal spray 1 spray  1 spray Each Nare BID PRN Adonis Brook, NP   1 spray at 09/19/14 0532  . pantoprazole (PROTONIX) EC tablet 40 mg  40 mg Oral Daily Charm Rings, NP   40 mg at 09/19/14 4098  . rivaroxaban (XARELTO) tablet 20 mg  20 mg Oral Q supper Charm Rings, NP   20 mg at 09/19/14 1657  . thiamine (VITAMIN B-1) tablet 100 mg  100 mg Oral Daily Shuvon B Rankin, NP   100 mg at 09/19/14 0813  . tiotropium (SPIRIVA) inhalation capsule 18 mcg  18 mcg Inhalation Daily Rachael Fee, MD   18 mcg at 09/19/14 [phone]  . tiZANidine (ZANAFLEX) tablet 4 mg  4 mg Oral TID Rachael Fee, MD   4 mg at 09/19/14 1656  . traZODone (DESYREL) tablet 50 mg  50 mg Oral QHS PRN Cleotis Nipper, MD   50 mg at 09/18/14 2145  . valACYclovir (VALTREX) tablet 1,000 mg  1,000 mg Oral Daily Rachael Fee, MD   1,000 mg at 09/19/14 4782    Lab Results:  Results for orders placed or performed during the hospital encounter of 09/13/14 (from the past 48 hour(s))  Glucose, capillary     Status: None   Collection Time: 09/17/14  7:55 PM  Result Value Ref Range   Glucose-Capillary 98 70 - 99 mg/dL  Glucose, capillary     Status: Abnormal   Collection Time: 09/17/14  9:07 PM  Result Value Ref  Range   Glucose-Capillary 178 (H) 70 - 99 mg/dL   Comment 1 Notify RN    Comment 2 Document in Chart   Glucose, capillary     Status: Abnormal   Collection Time: 09/18/14  5:26 AM  Result Value Ref Range   Glucose-Capillary 112 (H) 70 - 99 mg/dL   Comment 1 Notify RN    Comment 2 Document in Chart   Glucose, capillary     Status: Abnormal   Collection Time: 09/18/14 11:37 AM  Result Value Ref Range   Glucose-Capillary 127 (H) 70 - 99 mg/dL   Comment 1 Notify RN   Glucose, capillary     Status: Abnormal   Collection Time: 09/18/14  4:32 PM  Result Value Ref Range   Glucose-Capillary 135 (H) 70 - 99 mg/dL  Glucose, capillary     Status: Abnormal   Collection Time: 09/18/14  9:34 PM  Result Value Ref Range   Glucose-Capillary 239 (H) 70 - 99 mg/dL  Glucose, capillary     Status: Abnormal   Collection Time: 09/19/14  5:45 AM  Result Value Ref Range   Glucose-Capillary 120 (H) 70 - 99 mg/dL  Glucose, capillary     Status: Abnormal   Collection Time: 09/19/14 11:49 AM  Result Value Ref Range   Glucose-Capillary 129 (H) 70 - 99 mg/dL  Glucose, capillary     Status: Abnormal   Collection Time: 09/19/14  4:49 PM  Result Value  Ref Range   Glucose-Capillary 164 (H) 70 - 99 mg/dL   Comment 1 Notify RN     Physical Findings: AIMS: Facial and Oral Movements Muscles of Facial Expression: None, normal Lips and Perioral Area: None, normal Jaw: None, normal Tongue: None, normal,Extremity Movements Upper (arms, wrists, hands, fingers): None, normal Lower (legs, knees, ankles, toes): None, normal, Trunk Movements Neck, shoulders, hips: None, normal, Overall Severity Severity of abnormal movements (highest score from questions above): None, normal Incapacitation due to abnormal movements: None, normal Patient's awareness of abnormal movements (rate only patient's report): No Awareness, Dental Status Current problems with teeth and/or dentures?: Yes (missing teeth) Does patient usually  wear dentures?: No  CIWA:  CIWA-Ar Total: 1 COWS:  COWS Total Score: 4  Treatment Plan Summary: Daily contact with patient to assess and evaluate symptoms and progress in treatment and Medication management Supportive approach/coping skills Alcohol Dependence: work out a relapse prevention plan Mood instability; continue to work with the Celexa Abilify Neurontin combination Hallucinations: continue the Abilify 7.5 mg BID reports improvement in the voices and the depression since starting this medication Will continue the muscle relaxant as per his request until he goes to [facility] CBT/mindfulness Continue to monitor and manage his BP   Medical Decision Making:  Review of Psycho-Social Stressors (1), Review or order clinical lab tests (1), Review of Medication Regimen & Side Effects (2) and Review of New Medication or Change in Dosage (2)     Silvina Hackleman A 09/19/2014, 5:39 PM

## 2014-09-19 NOTE — Progress Notes (Signed)
Patient attended group on progressive relaxation and participated appropriately. Billy [name]  

## 2014-09-19 NOTE — Plan of Care (Signed)
Problem: Ineffective individual coping Goal: STG: Patient will remain free from self harm Outcome: Progressing Patient has not engaged in self harm and presently denies SI  Problem: Diagnosis: Increased Risk For Suicide Attempt Goal: STG-Patient Will Comply With Medication Regime Outcome: Progressing Patient has been med compliant and is receptive to med education.

## 2014-09-19 NOTE — Progress Notes (Addendum)
1400: Patient came out of group stating, "I just feel like I could rip someone's head off. Like I could strangle someone. It must be a side effect because I've never felt this way." Discussed options for decreasing stimuli and patient agreed it would be best to go to his room and try a hot shower. Patient does not have any available prn medications. Will inform Dr. Dub MikesLugo upon his return to the unit. Will continue to monitor closely. Billy [name], Billy [name]   1515: Patient came back up to nurses' station stating, "I'm okay now. Whatever it was, it passed. I feel much better. I got a shower and now I'm going to rest." patient calm. Billy [name], Billy [name]

## 2014-09-19 NOTE — Progress Notes (Addendum)
PROGRESS NOTE  Billy FlakesMelvin K [name] ZOX:[id number] DOB: 25-Mar-1961 DOA: 09/13/2014 PCP: No PCP Per Patient  Brief history 54 year old male with history of hypertension, diabetes mellitus, WPW.  [facility] because of hearing voices that tell him to kill himself.polysubstance abuse with UDS positive for benzos, cocaine and THC. [facility] consulted for hypertension and medical management. The patient endorsed poor compliance prior to hospitalization. Assessment/Plan: Hypertension -Poorly controlled hypertension, patient was on lisinopril 10 mg and clonidine for his withdrawal symptoms. -Increase lisinopril to 20 mg by mouth daily (4/19) -add amlodipine 5 mg daily -BP rising as clonidine is being weaned off (for withdrawl).  -Avoid beta blockers without alpha blockage as patient has history of cocaine abuse. Pt also mildly bradycardic. -recheck BMP  Bradycardia, sinus -Heart rate was in the 40s on 4/14, now in upper 50s and lower 60s -avoid beta and other AV nodal blocking medications. -Patient currently clonidine for withdrawal symptoms, please avoid clonidine as long-term as he has bradycardia.  Diabetes mellitus type 2 -Appears to be controlled, continue home medications Lantus insulin 30 units -Hemoglobin A1c is 6.7  -Continue NovoLog sliding scale while in the hospital  Depression Per primary service, patient also appears to have psychotic symptoms with auditory hallucinations. Started on Celexa, trazodone, Abilify and lithium.  DVT R-Common Femoral Vein -continue Xarelto but doubt he is taking anyway prior to admission -10/26/13 and 02/28/14 duplex positive for DVT on Right -no chest pain or sob  Polysubstance abuse Per primary service, positive for THC, benzos and cocaine.       Subjective: Patient denies fevers, chills, headache, chest pain, dyspnea, nausea, vomiting, diarrhea, abdominal pain, dysuria,  hematuria   Objective: Filed Vitals:   09/19/14 0642 09/19/14 0730 09/19/14 1200 09/19/14 1643  BP: 196/111 163/65 101/50 134/88  Pulse: 64 62 75 60  Temp:      TempSrc:      Resp:    18  Height:      Weight:      SpO2:       No intake or output data in the 24 hours ending 09/19/14 1834 Weight change:  Exam:   General:  Pt is alert, follows commands appropriately, not in acute distress  HEENT: No icterus, No thrush, [place]/AT  Cardiovascular: RRR, S1/S2, no rubs, no gallops  Respiratory: CTA bilaterally, no wheezing, no crackles, no rhonchi  Abdomen: Soft/+BS, non tender, non distended, no guarding  Extremities: No edema, No lymphangitis, No petechiae, No rashes, no synovitis  Data Reviewed: Basic Metabolic Panel: No results for input(s): NA, K, CL, CO2, GLUCOSE, BUN, CREATININE, CALCIUM, MG, PHOS in the last 168 hours. Liver Function Tests: No results for input(s): AST, ALT, ALKPHOS, BILITOT, PROT, ALBUMIN in the last 168 hours. No results for input(s): LIPASE, AMYLASE in the last 168 hours. No results for input(s): AMMONIA in the last 168 hours. CBC: No results for input(s): WBC, NEUTROABS, HGB, HCT, MCV, PLT in the last 168 hours. Cardiac Enzymes: No results for input(s): CKTOTAL, CKMB, CKMBINDEX, TROPONINI in the last 168 hours. BNP: Invalid input(s): POCBNP CBG:  Recent Labs Lab 09/18/14 1632 09/18/14 2134 09/19/14 0545 09/19/14 1149 09/19/14 1649  GLUCAP 135* 239* 120* 129* 164*    No results found for this or any previous visit (from the past 240 hour(s)).   Scheduled Meds: . amLODipine  5 mg Oral Daily  . ARIPiprazole  7.5 mg Oral BID  . citalopram  20  mg Oral Daily  . docusate sodium  100 mg Oral BID  . feeding supplement (RESOURCE BREEZE)  1 Container Oral BID BM  . gabapentin  600 mg Oral TID  . insulin aspart  0-15 Units Subcutaneous TID WC  . insulin glargine  30 Units Subcutaneous QHS  . lisinopril  20 mg Oral Daily  . lithium carbonate   300 mg Oral Daily  . lithium carbonate  600 mg Oral QHS  . LORazepam  2 mg Oral QHS  . magnesium citrate  1 Bottle Oral Once  . multivitamin with minerals  1 tablet Oral Daily  . nicotine  7 mg Transdermal Daily  . pantoprazole  40 mg Oral Daily  . rivaroxaban  20 mg Oral Q supper  . thiamine  100 mg Oral Daily  . tiotropium  18 mcg Inhalation Daily  . tiZANidine  4 mg Oral TID  . valACYclovir  1,000 mg Oral Daily   Continuous Infusions:    Billy Ferrero, DO  [facility] Pager [phone]  If 7PM-7AM, please contact night-coverage [email or web address] Password [facility] 09/19/2014, 6:34 PM   LOS: 6 days

## 2014-09-19 NOTE — Progress Notes (Signed)
D: Pt is negative for any SI/HI/AVH this evening. Pt reports having a drastic change in his mood from admission. " I'm feeling much better". Pt reports his psychiatric medications as effective. [facility] Pt is visible and active within the milieu.  A: Writer administered scheduled and prn medications to pt, per MD orders. Continued support and availability as needed was extended to this pt. Staff continue to monitor pt with q915min checks.  R: No adverse drug reactions noted. Pt receptive to treatment. Pt remains safe at this time.

## 2014-09-19 NOTE — [facility] Group Notes (Signed)
[facility] LCSW Group Therapy  09/19/2014 1:26 PM  Type of Therapy:  Group Therapy  Participation Level:  Active  Participation Quality:  Attentive  Affect:  Appropriate  Cognitive:  Alert and Oriented  Insight:  Improving  Engagement in Therapy:  Improving   Modes of Intervention:  Discussion, Education, Exploration, Problem-solving, Rapport Building, Socialization and Support  Summary of Progress/Problems: [facility] Speaker came to talk about his personal journey with substance abuse and addiction. The pt processed ways by which to relate to the speaker. MHA speaker provided handouts and educational information pertaining to groups and services [facility] by [facility].   [name], Billy Casillas LCSWA 09/19/2014, 1:26 PM

## 2014-09-19 NOTE — Progress Notes (Signed)
D: Pt denies any active withdrawal symptoms. Pt continues to be passively SI. Pt verbally contracts for safety. Pt is currently negative for any HI/AVH. Pt is hoping to be accepted into [facility]. Pt verbalizes readiness in staying sober. Pt is visible and active within the milieu.  A: Writer administered scheduled and prn medications to pt, per Md orders. Continued support and availability as needed was extended to this pt. Staff continue to monitor pt with q4115min checks.   R: No adverse drug reactions noted. Pt receptive to treatment. Pt remains safe at this time.

## 2014-09-19 NOTE — Progress Notes (Signed)
Asymptomatic HTN. On-call extender contacted. Order given for hyrdralazine 10 mg Q4PRN for SBP>180.

## 2014-09-20 DIAGNOSIS — F1910 Other psychoactive substance abuse, uncomplicated: Secondary | ICD-10-CM

## 2014-09-20 LAB — CBC
HEMATOCRIT: 49.5 % (ref 39.0–52.0)
Hemoglobin: 16.9 g/dL (ref 13.0–17.0)
MCH: 33.1 pg (ref 26.0–34.0)
MCHC: 34.1 g/dL (ref 30.0–36.0)
MCV: 96.9 fL (ref 78.0–100.0)
Platelets: 145 10*3/uL — ABNORMAL LOW (ref 150–400)
RBC: 5.11 MIL/uL (ref 4.22–5.81)
RDW: 13.6 % (ref 11.5–15.5)
WBC: 9.8 10*3/uL (ref 4.0–10.5)

## 2014-09-20 LAB — GLUCOSE, CAPILLARY
GLUCOSE-CAPILLARY: 201 mg/dL — AB (ref 70–99)
Glucose-Capillary: 168 mg/dL — ABNORMAL HIGH (ref 70–99)
Glucose-Capillary: 151 mg/dL — ABNORMAL HIGH (ref 70–99)
Glucose-Capillary: 133 mg/dL — ABNORMAL HIGH (ref 70–99)

## 2014-09-20 LAB — BASIC METABOLIC PANEL
ANION GAP: 7 (ref 5–15)
BUN: 14 mg/dL (ref 6–23)
CALCIUM: 9.9 mg/dL (ref 8.4–10.5)
CO2: 27 mmol/L (ref 19–32)
CREATININE: 1.18 mg/dL (ref 0.50–1.35)
Chloride: 106 mmol/L (ref 96–112)
GFR calc Af Amer: 80 mL/min — ABNORMAL LOW (ref >90)
GFR calc non Af Amer: 69 mL/min — ABNORMAL LOW (ref >90)
GLUCOSE: 133 mg/dL — AB (ref 70–99)
Potassium: 4.6 mmol/L (ref 3.5–5.1)
Sodium: 140 mmol/L (ref 135–145)

## 2014-09-20 MED ORDER — AMLODIPINE BESYLATE 10 MG PO TABS
10.0000 mg | ORAL_TABLET | Freq: Every day | ORAL | Status: DC
  Administered 2014-09-21: 10 mg via ORAL
  Filled 2014-09-20 (×2): qty 1

## 2014-09-20 MED ORDER — AMLODIPINE BESYLATE 5 MG PO TABS: 5.0000 mg | ORAL_TABLET | Freq: Every day | ORAL | Status: DC

## 2014-09-20 MED ORDER — AMLODIPINE BESYLATE 5 MG PO TABS
5.0000 mg | ORAL_TABLET | Freq: Once | ORAL | Status: AC
  Administered 2014-09-20: 5 mg via ORAL
  Filled 2014-09-20: qty 1

## 2014-09-20 MED ORDER — GABAPENTIN 600 MG PO TABS
600.0000 mg | ORAL_TABLET | Freq: Three times a day (TID) | ORAL | Status: DC
  Filled 2014-09-20: qty 42

## 2014-09-20 MED ORDER — TIZANIDINE HCL 4 MG PO TABS: 4.0000 mg | ORAL_TABLET | Freq: Three times a day (TID) | ORAL | Status: DC

## 2014-09-20 MED ORDER — LORAZEPAM 1 MG PO TABS
1.0000 mg | ORAL_TABLET | Freq: Every day | ORAL | Status: DC
  Administered 2014-09-20: 1 mg via ORAL
  Filled 2014-09-20: qty 1

## 2014-09-20 MED ORDER — LORAZEPAM 2 MG PO TABS: 2.0000 mg | ORAL_TABLET | Freq: Every day | ORAL | Status: DC

## 2014-09-20 MED ORDER — LISINOPRIL 20 MG PO TABS: 20.0000 mg | ORAL_TABLET | Freq: Every day | ORAL | Status: DC

## 2014-09-20 MED ORDER — HYDRALAZINE HCL 25 MG PO TABS: 25.0000 mg | ORAL_TABLET | Freq: Four times a day (QID) | ORAL | Status: DC

## 2014-09-20 MED ORDER — CITALOPRAM HYDROBROMIDE 20 MG PO TABS: 20.0000 mg | ORAL_TABLET | Freq: Every day | ORAL | Status: DC

## 2014-09-20 MED ORDER — AMLODIPINE BESYLATE 10 MG PO TABS
10.0000 mg | ORAL_TABLET | Freq: Every day | ORAL | Status: DC
Start: 2014-09-21 — End: 2015-08-15

## 2014-09-20 MED ORDER — HYDRALAZINE HCL 25 MG PO TABS
25.0000 mg | ORAL_TABLET | Freq: Once | ORAL | Status: DC
  Filled 2014-09-20 (×2): qty 1

## 2014-09-20 MED ORDER — ARIPIPRAZOLE 15 MG PO TABS: 7.5000 mg | ORAL_TABLET | Freq: Two times a day (BID) | ORAL | Status: DC

## 2014-09-20 MED ORDER — TRAZODONE HCL 50 MG PO TABS: 50.0000 mg | ORAL_TABLET | Freq: Every evening | ORAL | Status: DC | PRN

## 2014-09-20 MED ORDER — DOCUSATE SODIUM 100 MG PO CAPS: 100.0000 mg | ORAL_CAPSULE | Freq: Two times a day (BID) | ORAL | Status: DC

## 2014-09-20 MED ORDER — GABAPENTIN 300 MG PO CAPS: 600.0000 mg | ORAL_CAPSULE | Freq: Three times a day (TID) | ORAL | Status: DC

## 2014-09-20 MED ORDER — TIOTROPIUM BROMIDE MONOHYDRATE 18 MCG IN CAPS: 18.0000 ug | ORAL_CAPSULE | Freq: Every day | RESPIRATORY_TRACT | Status: DC

## 2014-09-20 MED ORDER — HYDRALAZINE HCL 25 MG PO TABS
25.0000 mg | ORAL_TABLET | Freq: Four times a day (QID) | ORAL | Status: DC
  Administered 2014-09-20 (×2): 25 mg via ORAL
  Filled 2014-09-20 (×4): qty 1
  Filled 2014-09-20: qty 56
  Filled 2014-09-20: qty 1
  Filled 2014-09-20: qty 56
  Filled 2014-09-20 (×5): qty 1
  Filled 2014-09-20 (×2): qty 56

## 2014-09-20 MED ORDER — OXYMETAZOLINE HCL 0.05 % NA SOLN: 1.0000 | Freq: Two times a day (BID) | NASAL | Status: DC | PRN

## 2014-09-20 MED ORDER — VALACYCLOVIR HCL 1 G PO TABS: 1000.0000 mg | ORAL_TABLET | Freq: Every day | ORAL | Status: DC

## 2014-09-20 MED ORDER — ALBUTEROL SULFATE HFA 108 (90 BASE) MCG/ACT IN AERS: 2.0000 | INHALATION_SPRAY | Freq: Four times a day (QID) | RESPIRATORY_TRACT | Status: DC | PRN

## 2014-09-20 NOTE — Progress Notes (Signed)
[facility] MD Progress Note  09/20/2014 6:14 PM Billy [name]  MRN:  [id number] Subjective:  Billy [name] expresses readiness to go to [facility] in the morning. His BP continues to fluctuate. Further medication adjustments were recommended. He will need to follow up with his PCP while at [facility]. His lithium level was not therapeutic and in the presence of some renal insuficeincy the recommendation was to D/C the Lithium. He states he wants to continue working on his sobriety.  Principal Problem: Bipolar affective disorder, depressed, severe, with psychotic behavior Diagnosis:   Patient Active Problem List   Diagnosis Date Noted  . Bipolar affective disorder, depressed, severe, with psychotic behavior [F31.5] 09/14/2014    Priority: High  . Chronic pain syndrome [G89.4] 09/14/2014    Priority: High  . Cocaine abuse [F14.10] 09/13/2014    Priority: High  . Alcohol abuse [F10.10] 10/12/2012    Priority: High  . Opiate abuse, episodic [F11.10] 09/02/2011    Priority: High  . Type 2 diabetes mellitus with hyperglycemia [E11.65] 09/19/2014  . Right leg DVT [I82.401] 09/19/2014  . Essential hypertension [I10] 09/19/2014  . Alcohol dependence with uncomplicated withdrawal [J94.174] 09/13/2014  . Homeless [Z59.0] 09/02/2011  . Diabetes mellitus [250] 08/30/2011  . WPW (Wolff-Parkinson-White syndrome) [I45.6] 08/30/2011   Total Time spent with patient: 30 minutes   Past Medical History:  Past Medical History  Diagnosis Date  . Diabetes mellitus   . Hepatitis C carrier   . WPW (Wolff-Parkinson-White syndrome)   . Depression   . Chronic pain   . Suicide attempt   . Diabetes mellitus without complication   . Hypertension   . Bipolar 1 disorder   . HCV (hepatitis C virus)   . Homeless     Past Surgical History  Procedure Laterality Date  . Rhinoplasty    . Chest surgery    . Back surgery    . Tonsillectomy     Family History: History reviewed. No pertinent family history. Social History:   History  Alcohol Use  . Yes    Comment: heavy     History  Drug Use  . Yes  . Special: Marijuana, "Crack" cocaine    History   Social History  . Marital Status: Divorced    Spouse Name: N/A  . Number of Children: N/A  . Years of Education: N/A   Social History Main Topics  . Smoking status: Current Every Day Smoker -- 0.25 packs/day for 8 years    Types: Cigarettes  . Smokeless tobacco: Not on file  . Alcohol Use: Yes     Comment: heavy  . Drug Use: Yes    Special: Marijuana, "Crack" cocaine  . Sexual Activity: Not on file   Other Topics Concern  . None   Social History Narrative   ** Merged History Encounter **       Additional History:    Sleep: affected by his roomate snoring  Appetite:  Fair   Assessment:   Musculoskeletal: Strength & Muscle Tone: within normal limits Gait & Station: normal Patient leans: N/A   Psychiatric Specialty Exam: Physical Exam  Review of Systems  Constitutional: Negative.   HENT: Negative.   Eyes: Negative.   Respiratory: Negative.   Cardiovascular: Negative.   Gastrointestinal: Negative.   Genitourinary: Negative.   Musculoskeletal: Negative.   Skin: Negative.   Neurological: Negative.   Endo/Heme/Allergies: Negative.   Psychiatric/Behavioral: Positive for substance abuse. The patient is nervous/anxious.     Blood pressure 173/103, pulse 62, temperature  98.8 F (37.1 C), temperature source Oral, resp. rate 20, height 5' 11" (1.803 m), weight 89.812 kg (198 lb), SpO2 99 %.Body mass index is 27.63 kg/(m^2).  General Appearance: Fairly Groomed  Engineer, water::  Fair  Speech:  Clear and Coherent  Volume:  Normal  Mood:  Anxious  Affect:  Appropriate  Thought Process:  Coherent and Goal Directed  Orientation:  Full (Time, Place, and Person)  Thought Content:  plans as he moves on, relapse prevention plan  Suicidal Thoughts:  No  Homicidal Thoughts:  No  Memory:  Immediate;   Fair Recent;   Fair Remote;   Fair   Judgement:  Fair  Insight:  Present  Psychomotor Activity:  Restlessness  Concentration:  Fair  Recall:  [employer] of Knowledge:Fair  Language: Fair  Akathisia:  No  Handed:  Right  AIMS (if indicated):     Assets:  Desire for Improvement  ADL's:  Intact  Cognition: WNL  Sleep:  Number of Hours: 5.25     Current Medications: Current Facility-Administered Medications  Medication Dose Route Frequency Provider Last Rate Last Dose  . acetaminophen (TYLENOL) tablet 650 mg  650 mg Oral Q6H PRN Patrecia Pour, NP   650 mg at 09/19/14 1949  . albuterol (PROVENTIL HFA;VENTOLIN HFA) 108 (90 BASE) MCG/ACT inhaler 2 puff  2 puff Inhalation Q6H PRN Nicholaus Bloom, MD   2 puff at 09/20/14 0458  . alum & mag hydroxide-simeth (MAALOX/MYLANTA) 200-200-20 MG/5ML suspension 30 mL  30 mL Oral Q4H PRN Patrecia Pour, NP      . Derrill Memo ON 09/21/2014] amLODipine (NORVASC) tablet 10 mg  10 mg Oral Daily Modena Jansky, MD      . ARIPiprazole (ABILIFY) tablet 7.5 mg  7.5 mg Oral BID Nicholaus Bloom, MD   7.5 mg at 09/20/14 1731  . citalopram (CELEXA) tablet 20 mg  20 mg Oral Daily Patrecia Pour, NP   20 mg at 09/20/14 0809  . docusate sodium (COLACE) capsule 100 mg  100 mg Oral BID Nicholaus Bloom, MD   100 mg at 09/20/14 1732  . feeding supplement (RESOURCE BREEZE) (RESOURCE BREEZE) liquid 1 Container  1 Container Oral BID BM Dorann Ou, RD   1 Container at 09/15/14 1545  . gabapentin (NEURONTIN) capsule 600 mg  600 mg Oral TID Nicholaus Bloom, MD   600 mg at 09/20/14 1731  . hydrALAZINE (APRESOLINE) tablet 10 mg  10 mg Oral Q4H PRN Laverle Hobby, PA-C   10 mg at 09/20/14 4825  . insulin aspart (novoLOG) injection 0-15 Units  0-15 Units Subcutaneous TID WC Laverle Hobby, PA-C   2 Units at 09/20/14 1734  . insulin glargine (LANTUS) injection 30 Units  30 Units Subcutaneous QHS Laverle Hobby, PA-C   30 Units at 09/19/14 2119  . lisinopril (PRINIVIL,ZESTRIL) tablet 20 mg  20 mg Oral Daily Verlee Monte,  MD   20 mg at 09/20/14 0807  . LORazepam (ATIVAN) tablet 2 mg  2 mg Oral QHS Nicholaus Bloom, MD   2 mg at 09/19/14 2120  . magnesium citrate solution 1 Bottle  1 Bottle Oral Once Nicholaus Bloom, MD   1 Bottle at 09/19/14 0800  . magnesium hydroxide (MILK OF MAGNESIA) suspension 30 mL  30 mL Oral Daily PRN Patrecia Pour, NP   30 mL at 09/18/14 0855  . multivitamin with minerals tablet 1 tablet  1 tablet Oral Daily Shuvon B  Rankin, NP   1 tablet at 09/20/14 0807  . neomycin-bacitracin-polymyxin (NEOSPORIN) ointment   Topical BID PRN Encarnacion Slates, NP      . nicotine (NICODERM CQ - dosed in mg/24 hr) patch 7 mg  7 mg Transdermal Daily Nicholaus Bloom, MD   7 mg at 09/20/14 0805  . oxymetazoline (AFRIN) 0.05 % nasal spray 1 spray  1 spray Each Nare BID PRN Kerrie Buffalo, NP   1 spray at 09/20/14 1542  . pantoprazole (PROTONIX) EC tablet 40 mg  40 mg Oral Daily Patrecia Pour, NP   40 mg at 09/20/14 5643  . rivaroxaban (XARELTO) tablet 20 mg  20 mg Oral Q supper Patrecia Pour, NP   20 mg at 09/20/14 1731  . thiamine (VITAMIN B-1) tablet 100 mg  100 mg Oral Daily Shuvon B Rankin, NP   100 mg at 09/20/14 0808  . tiotropium (SPIRIVA) inhalation capsule 18 mcg  18 mcg Inhalation Daily Nicholaus Bloom, MD   18 mcg at 09/20/14 0805  . tiZANidine (ZANAFLEX) tablet 4 mg  4 mg Oral TID Nicholaus Bloom, MD   4 mg at 09/20/14 1731  . traZODone (DESYREL) tablet 50 mg  50 mg Oral QHS PRN Kathlee Nations, MD   50 mg at 09/18/14 2145  . valACYclovir (VALTREX) tablet 1,000 mg  1,000 mg Oral Daily Nicholaus Bloom, MD   1,000 mg at 09/20/14 0809    Lab Results:  Results for orders placed or performed during the hospital encounter of 09/13/14 (from the past 48 hour(s))  Glucose, capillary     Status: Abnormal   Collection Time: 09/18/14  9:34 PM  Result Value Ref Range   Glucose-Capillary 239 (H) 70 - 99 mg/dL  Glucose, capillary     Status: Abnormal   Collection Time: 09/19/14  5:45 AM  Result Value Ref Range    Glucose-Capillary 120 (H) 70 - 99 mg/dL  Glucose, capillary     Status: Abnormal   Collection Time: 09/19/14 11:49 AM  Result Value Ref Range   Glucose-Capillary 129 (H) 70 - 99 mg/dL  Glucose, capillary     Status: Abnormal   Collection Time: 09/19/14  4:49 PM  Result Value Ref Range   Glucose-Capillary 164 (H) 70 - 99 mg/dL   Comment 1 Notify RN   Basic metabolic panel     Status: Abnormal   Collection Time: 09/19/14  7:19 PM  Result Value Ref Range   Sodium 140 135 - 145 mmol/L   Potassium 4.9 3.5 - 5.1 mmol/L   Chloride 105 96 - 112 mmol/L   CO2 28 19 - 32 mmol/L   Glucose, Bld 128 (H) 70 - 99 mg/dL   BUN 15 6 - 23 mg/dL   Creatinine, Ser 1.34 0.50 - 1.35 mg/dL   Calcium 10.0 8.4 - 10.5 mg/dL   GFR calc non Af Amer 59 (L) >90 mL/min   GFR calc Af Amer 69 (L) >90 mL/min    Comment: (NOTE) The eGFR has been calculated using the CKD EPI equation. This calculation has not been validated in all clinical situations. eGFR's persistently <90 mL/min signify possible Chronic Kidney Disease.    Anion gap 7 5 - 15    Comment: Performed at [facility]  Glucose, capillary     Status: Abnormal   Collection Time: 09/19/14  8:58 PM  Result Value Ref Range   Glucose-Capillary 137 (H) 70 - 99 mg/dL  Glucose,  capillary     Status: Abnormal   Collection Time: 09/20/14  6:24 AM  Result Value Ref Range   Glucose-Capillary 168 (H) 70 - 99 mg/dL  Basic metabolic panel     Status: Abnormal   Collection Time: 09/20/14 10:52 AM  Result Value Ref Range   Sodium 140 135 - 145 mmol/L   Potassium 4.6 3.5 - 5.1 mmol/L   Chloride 106 96 - 112 mmol/L   CO2 27 19 - 32 mmol/L   Glucose, Bld 133 (H) 70 - 99 mg/dL   BUN 14 6 - 23 mg/dL   Creatinine, Ser 1.18 0.50 - 1.35 mg/dL   Calcium 9.9 8.4 - 10.5 mg/dL   GFR calc non Af Amer 69 (L) >90 mL/min   GFR calc Af Amer 80 (L) >90 mL/min    Comment: (NOTE) The eGFR has been calculated using the CKD EPI equation. This calculation has  not been validated in all clinical situations. eGFR's persistently <90 mL/min signify possible Chronic Kidney Disease.    Anion gap 7 5 - 15    Comment: Performed at [facility]  CBC     Status: Abnormal   Collection Time: 09/20/14 10:55 AM  Result Value Ref Range   WBC 9.8 4.0 - 10.5 K/uL   RBC 5.11 4.22 - 5.81 MIL/uL   Hemoglobin 16.9 13.0 - 17.0 g/dL   HCT 49.5 39.0 - 52.0 %   MCV 96.9 78.0 - 100.0 fL   MCH 33.1 26.0 - 34.0 pg   MCHC 34.1 30.0 - 36.0 g/dL   RDW 13.6 11.5 - 15.5 %   Platelets 145 (L) 150 - 400 K/uL    Comment: Performed at [facility]  Glucose, capillary     Status: Abnormal   Collection Time: 09/20/14 11:59 AM  Result Value Ref Range   Glucose-Capillary 151 (H) 70 - 99 mg/dL   Comment 1 Notify RN   Glucose, capillary     Status: Abnormal   Collection Time: 09/20/14  5:20 PM  Result Value Ref Range   Glucose-Capillary 133 (H) 70 - 99 mg/dL    Physical Findings: AIMS: Facial and Oral Movements Muscles of Facial Expression: None, normal Lips and Perioral Area: None, normal Jaw: None, normal Tongue: None, normal,Extremity Movements Upper (arms, wrists, hands, fingers): None, normal Lower (legs, knees, ankles, toes): None, normal, Trunk Movements Neck, shoulders, hips: None, normal, Overall Severity Severity of abnormal movements (highest score from questions above): None, normal Incapacitation due to abnormal movements: None, normal Patient's awareness of abnormal movements (rate only patient's report): No Awareness, Dental Status Current problems with teeth and/or dentures?: Yes (missing teeth) Does patient usually wear dentures?: No  CIWA:  CIWA-Ar Total: 1 COWS:  COWS Total Score: 4  Treatment Plan Summary: Daily contact with patient to assess and evaluate symptoms and progress in treatment and Medication management Supportive approach/copign skills Alcohol Dependence; continue to work a relapse prevention  plan Mood instability; will D/C the Lithium as it has never achieved therapeutic levels and he is showing some renal impairment  Hypertension; will follow the internal medicine recommendations Will facilitate admission to [facility] in the AM Medical Decision Making:  Review of Psycho-Social Stressors (1), Review of Medication Regimen & Side Effects (2) and Review of New Medication or Change in Dosage (2)     Aneka Fagerstrom A 09/20/2014, 6:14 PM

## 2014-09-20 NOTE — Progress Notes (Signed)
Pt did not attend NA group this evening.  

## 2014-09-20 NOTE — Progress Notes (Signed)
Patient ID: Billy [name], male   DOB: [birth year], 54 y.o.   MRN: [id number]   All discharge paperwork is in line for his DC tomorrow. Placed in the front of pt chart. [facility] pharmacy called to receive samples for medication [facility]. Waiting for reply, 3:59 PM.   5:50 PM [facility] pharmacy called and notified writer that Amlodipine sample is ready and to be sent over tonight. Stated samples be here before pt DC time.  In order for [facility] to [facility], pt must have the correct 14 day samples.

## 2014-09-20 NOTE — Progress Notes (Signed)
PROGRESS NOTE  Billy FlakesMelvin K [name] AVW:[id number] DOB: 10/31/1960 DOA: 09/13/2014 PCP: Dorrene GermanAVBUERE,EDWIN A, MD  Brief history 54 year old male with history of hypertension, diabetes mellitus, WPW [facility] because of hearing voices that tell him to kill himself, polysubstance abuse with UDS positive for benzos, cocaine and THC. [facility] consulted for hypertension and medical management. The patient endorsed poor compliance prior to hospitalization.  Assessment/Plan: Essential Hypertension -Poorly controlled hypertension, patient was on lisinopril 10 mg and clonidine for his withdrawal symptoms. - Increase lisinopril to 20 mg by mouth daily (4/19) -added amlodipine 5 mg daily 4/19 -BP rising as clonidine has weaned off (for withdrawl).  - Avoid beta blockers without alpha blockage as patient has history of cocaine abuse. Pt also mildly bradycardic. - Blood pressures continued to be elevated. Hydralazine was initiated overnight 4/19. - Would like to keep his medication regimen simple for compliance reasons and hence DC hydralazine and increase amlodipine to 10 MG daily on 4/20 - If blood pressures continue to be elevated, consider starting low-dose HCTZ at 12.5 MG daily followed by increasing Lisinopril to 20 mg BID - Will need close outpatient follow-up with PCP for further evaluation & management - Will need periodic BMP evaluation to assess renal function while on ACEI. Creatinine was mildly elevated at 1.34 on 4/19 night but has improved to 1.18 on 4/20. Lithium has been discontinued by psychiatry. - Discussed at length with primary psychiatrist on 4/20.  Bradycardia, sinus -Heart rate was in the 40s on 4/14, now seems to have resolved. May be secondary to clonidine which was discontinued -avoid beta and other AV nodal blocking medications. - please avoid clonidine as long-term due to concern for recurrent bradycardia  Diabetes mellitus type  2 -Appears to be controlled, continue home medications Lantus insulin 30 units -Hemoglobin A1c is 6.7  -Continue NovoLog sliding scale while in the hospital  Depression Per primary service, patient also appears to have psychotic symptoms with auditory hallucinations. Started on Celexa, trazodone, Abilify and lithium.  DVT R-Common Femoral Vein -continue Xarelto and advised patient regarding compliance which he verbalized understanding -10/26/13 and 02/28/14 duplex positive for DVT on Right -no chest pain or sob  Polysubstance abuse Per primary service, positive for THC, benzos and cocaine.   Subjective: Patient denies complaints. Denied chest pain, dyspnea, palpitations, dizziness or lightheadedness. States that he used to be on HCTZ at one point but was discontinued for unclear reasons.   Objective: Filed Vitals:   09/20/14 1203 09/20/14 1549 09/20/14 1700 09/20/14 1728  BP: 153/100 143/111 184/119 173/103  Pulse: 109 87 74 62  Temp:      TempSrc:      Resp:      Height:      Weight:      SpO2:       respiratory rate 20 per minute and temperature 98.23F No intake or output data in the 24 hours ending 09/20/14 1837 Weight change:  Exam:   General:  Pleasant middle-aged male ambulating comfortably in the halls  HEENT: No icterus, No thrush, [place]/AT  Cardiovascular: RRR, S1/S2, no rubs, no gallops  Respiratory: CTA bilaterally, no wheezing, no crackles, no rhonchi  Abdomen: Soft/+BS, non tender, non distended, no guarding  Extremities: No edema, No lymphangitis, No petechiae, No rashes, no synovitis  Data Reviewed: Basic Metabolic Panel:  Recent Labs Lab 09/19/14 1919 09/20/14 1052  NA 140 140  K 4.9 4.6  CL 105  106  CO2 28 27  GLUCOSE 128* 133*  BUN 15 14  CREATININE 1.34 1.18  CALCIUM 10.0 9.9   Liver Function Tests: No results for input(s): AST, ALT, ALKPHOS, BILITOT, PROT, ALBUMIN in the last 168 hours. No results for input(s): LIPASE, AMYLASE in the  last 168 hours. No results for input(s): AMMONIA in the last 168 hours. CBC:  Recent Labs Lab 09/20/14 1055  WBC 9.8  HGB 16.9  HCT 49.5  MCV 96.9  PLT 145*   Cardiac Enzymes: No results for input(s): CKTOTAL, CKMB, CKMBINDEX, TROPONINI in the last 168 hours. BNP: Invalid input(s): POCBNP CBG:  Recent Labs Lab 09/19/14 1649 09/19/14 2058 09/20/14 0624 09/20/14 1159 09/20/14 1720  GLUCAP 164* 137* 168* 151* 133*    No results found for this or any previous visit (from the past 240 hour(s)).   Scheduled Meds: . [START ON 09/21/2014] amLODipine  10 mg Oral Daily  . ARIPiprazole  7.5 mg Oral BID  . citalopram  20 mg Oral Daily  . docusate sodium  100 mg Oral BID  . feeding supplement (RESOURCE BREEZE)  1 Container Oral BID BM  . gabapentin  600 mg Oral TID  . insulin aspart  0-15 Units Subcutaneous TID WC  . insulin glargine  30 Units Subcutaneous QHS  . lisinopril  20 mg Oral Daily  . LORazepam  1 mg Oral QHS  . magnesium citrate  1 Bottle Oral Once  . multivitamin with minerals  1 tablet Oral Daily  . nicotine  7 mg Transdermal Daily  . pantoprazole  40 mg Oral Daily  . rivaroxaban  20 mg Oral Q supper  . thiamine  100 mg Oral Daily  . tiotropium  18 mcg Inhalation Daily  . tiZANidine  4 mg Oral TID  . valACYclovir  1,000 mg Oral Daily   Continuous Infusions:    Billy Eisenbeis, MD, FACP, FHM. [facility] Pager [phone]  If 7PM-7AM, please contact night-coverage [email or web address] Password [facility] 09/20/2014, 6:41 PM   LOS: 7 days

## 2014-09-20 NOTE — Progress Notes (Signed)
Patient ID: Billy [name], male   DOB: 04-25-1961, 54 y.o.   MRN: [id number]   Pt currently presents with a animated affect and irritable, domineering behavior. Per self inventory, pt rates depression at a 6, hopelessness 6 and anxiety 8. Pt's daily goal is to [facility]."  Pt reports poor sleep, good concentration and a good appetite. Pt remains silly and jokes inappropriately with staff.   Pt provided with medications per providers orders. Pt's labs and vitals were monitored throughout the day. Pt supported emotionally and encouraged to express concerns and questions. Pt educated on medications.  Pt's safety ensured with 15 minute and environmental checks. Pt currently denies SI/HI and A/V hallucinations. Pt verbally agrees to seek staff if SI/HI or A/VH occurs and to consult with staff before acting on these thoughts.
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
## 2014-09-20 NOTE — [facility] Group Notes (Signed)
[facility] L[facility] Dis[facility] Planning [facility] Note   09/20/2014 9:45 AM  Participation Quality:  Appropriate   Mood/Affect:  Appropriate  Depression Rating:  4-5  Anxiety Rating:  6  Thoughts of Suicide:  No Will you contract for safety?   NA  Current AVH:  No  Plan for Discharge/Comments:  Pt reports that [facility] is [facility] to [facility] and is aware that he is going for screening for Possible admission. Pt reports that abilify is working well and no withdrawals. Pt reports 'wonderful' sleep. [facility].   Transportation Means: bus pass   Supports: none identified by pt.  Smart, American FinancialHeather LCSWA

## 2014-09-20 NOTE — Progress Notes (Signed)
D: Pt reports readiness to be [facility]. Pt presents anxious and pleasant in mood this evening. Pt did not attend NA this evening. " Its just a bunch of war stories". Pt is currently negative for any SI/HI/AVH. Pt's Bp and pulse WDL tonight.  A: Writer administered scheduled and prn medications to pt, per MD orders. Continued support and availability as needed was extended to this pt. Staff continue to monitor pt with q7415min checks.  R: No adverse drug reactions noted. Pt receptive to treatment. Pt remains safe at this time.

## 2014-09-20 NOTE — [facility] Suicide Risk Assessment (Signed)
[facility] Discharge Suicide Risk Assessment   Demographic Factors:  Male and Caucasian  Total Time spent with patient: 30 minutes  Musculoskeletal: Strength & Muscle Tone: within normal limits Gait & Station: normal Patient leans: N/[name]  Psychiatric Specialty Exam: Physical Exam  Review of Systems  Constitutional: Negative.   HENT: Negative.   Eyes: Negative.   Respiratory: Negative.   Cardiovascular: Negative.   Gastrointestinal: Negative.   Genitourinary: Negative.   Musculoskeletal: Negative.   Skin: Negative.   Neurological: Negative.   Endo/Heme/Allergies: Negative.   Psychiatric/Behavioral: Positive for substance abuse. The patient is nervous/anxious.     Blood pressure 173/103, pulse 62, temperature 98.8 F (37.1 C), temperature source Oral, resp. rate 20, height  (1.803 m), weight 89.812 kg (198 lb), SpO2 99 %.Body mass index is 27.63 kg/(m^2).  General Appearance: Fairly Groomed  Patent attorney::  Fair  Speech:  Clear and Coherent409  Volume:  Normal  Mood:  Anxious  Affect:  Appropriate  Thought Process:  Coherent and Goal Directed  Orientation:  Full (Time, Place, and Person)  Thought Content:  plans as he moves on, relapse prevention plan  Suicidal Thoughts:  No  Homicidal Thoughts:  No  Memory:  Immediate;   Fair Recent;   Fair Remote;   Fair  Judgement:  Fair  Insight:  Present and Shallow  Psychomotor Activity:  Restlessness  Concentration:  Fair  Recall:  [employer] of Knowledge:Fair  Language: Fair  Akathisia:  No  Handed:  Right  AIMS (if indicated):     Assets:  Desire for Improvement  Sleep:  Number of Hours: 5.25  Cognition: WNL  ADL's:  Intact   Have you used any form of tobacco in the last 30 days? (Cigarettes, Smokeless Tobacco, Cigars, and/or Pipes): Yes  Has this patient used any form of tobacco in the last 30 days? (Cigarettes, Smokeless Tobacco, Cigars, and/or Pipes) Yes, [name] prescription for an FDA-approved tobacco cessation  medication was offered at discharge and the patient refused  Mental Status Per Nursing Assessment::   On Admission:  NA  Current Mental Status by Physician: In full contact with reality. There are no active S/S of withdrawal. There are no active SI plans or intent. He is willing and motivated to pursue further work on his recovery though [facility]   Loss Factors: Decline in physical health  Historical Factors: NA  Risk Reduction Factors:   wants to do better  Continued Clinical Symptoms:  Bipolar Disorder:   Depressive phase Alcohol/Substance Abuse/Dependencies  Cognitive Features That Contribute To Risk:  Closed-mindedness, Polarized thinking and Thought constriction (tunnel vision)    Suicide Risk:  Minimal: No identifiable suicidal ideation.  Patients presenting with no risk factors but with morbid ruminations; may be classified as minimal risk based on the severity of the depressive symptoms  Principal Problem: Bipolar affective disorder, depressed, severe, with psychotic behavior Discharge Diagnoses:  Patient Active Problem List   Diagnosis Date Noted  . Bipolar affective disorder, depressed, severe, with psychotic behavior [F31.5] 09/14/2014    Priority: High  . Chronic pain syndrome [G89.4] 09/14/2014    Priority: High  . Cocaine abuse [F14.10] 09/13/2014    Priority: High  . Alcohol abuse [F10.10] 10/12/2012    Priority: High  . Opiate abuse, episodic [F11.10] 09/02/2011    Priority: High  . Type 2 diabetes mellitus with hyperglycemia [E11.65] 09/19/2014  . Right leg DVT [I82.401] 09/19/2014  . Essential hypertension [I10] 09/19/2014  . Alcohol dependence with uncomplicated withdrawal [F10.230]  09/13/2014  . Homeless [Z59.0] 09/02/2011  . Diabetes mellitus [250] 08/30/2011  . WPW (Wolff-Parkinson-White syndrome) [I45.6] 08/30/2011    Follow-up Information    Follow up with [facility] On 09/27/2014.   Why:  Appt for therapy with Billy SallesAnita [name]  on this date at 12PM. Next therapy appt with Billy Failnita is on 10/06/14 at 2:00PM. Please reschedule if accepted into [facility]. Thanks.    Contact information:   [address]  [place], [place] [address] Phone: [phone] Fax: [phone]      Follow up with [facility] On 09/25/2014.   Why:  Appt on this date for hospital follow-up/medication management at 2:15PM with Billy [name]. Please note: [facility]. Cancel/reschedule if [facility] please.    Contact information:   ATTN: Dr. Ramond [name] [address] [place], [place] [address] Phone: [phone] Fax: [phone]      [facility] 09/[facility]2014.   Why:  Medical Record number: [id number]. Screening and possible admission on this date at 8:00AM. (I [facility] Billy CanalesSteve [facility]). You must [place] photo [place] address.    Contact information:   [address] [place], [place] [address] Phone: [phone] Fax: [phone]      Follow up with [facility] On 11/16/2014.   Why:  Appt on this date for psychiatry/medication management at 8:15AM with Billy [name].     Contact information:   [address] [place], [place] [address] Phone: [phone] Fax: [phone]      Plan Of Care/Follow-up recommendations:  Activity:  as tolerated Diet:  regular Follow up [facility] Is patient on multiple antipsychotic therapies at discharge:  No   Has Patient had three or more failed trials of antipsychotic monotherapy by history:  No  Recommended Plan for Multiple Antipsychotic Therapies: NA    Billy [name] 09/20/2014, 6:27 PM

## 2014-09-20 NOTE — Progress Notes (Signed)
  [facility] :  Will you be returning to the same living situation after discharge:  No.Pt has [facility] and [facility].  At discharge, do you have transportation home?: Yes,  bus pass in chart. PT MUST D/C BY 6:15AM TO CATCH BUS TO [facility] [address], WHERE [facility] WILL PICK HIM UP AT 7:30AM Do you have the ability to pay for your medications: Yes,  Medicare  Release of information consent forms completed and submitted to Medical Records by CSW.  Patient to Follow up at: Follow-up Information    Follow up with [facility] On 09/27/2014.   Why:  Appt for therapy with Patton SallesAnita Jones on this date at 12PM. Next therapy appt with Synetta Failnita is on 10/06/14 at 2:00PM. Please reschedule if accepted into [facility]. Thanks.    Contact information:   [address]  [place], [place] [address] Phone: [phone] Fax: [phone]      Follow up with [facility] On 09/25/2014.   Why:  Appt on this date for hospital follow-up/medication management at 2:15PM with Dr. Concepcion ElkAvbuere. Please note: [facility]. Cancel/reschedule if [facility] please.    Contact information:   ATTN: Dr. Ramond CraverAvbeure [address] [place], [place] [address] Phone: [phone] Fax: [phone]      [facility] On 09/21/2014.   Why:  Medical Record number: [id number]. Screening and possible admission on this date at 8:00AM. You must [place].    Contact information:   [address] [place], [place] [address] Phone: [phone] Fax: [phone]      Follow up with [facility] On 11/16/2014.   Why:  Appt on this date for psychiatry/medication management at 8:15AM with Dr. Lolly MustacheArfeen.     Contact information:   [address] [place], [place] [address] Phone: [phone] Fax: [phone]       Patient denies SI/HI: Yes,  during group/self report.     Safety Planning and Suicide Prevention discussed: Yes,  SPE completed with pt. SPI pamphlet provided to pt and he was encouraged to share information with support network, ask questions, and talk about any concerns relating to SPE.  Have you used any form of tobacco in the last 30 days? (Cigarettes, Smokeless Tobacco, Cigars, and/or Pipes): Yes  Has patient been referred to the Quitline?: Patient refused referral  Smart, Lebron QuamHeather LCSWA  09/20/2014, 12:57 PM

## 2014-09-20 NOTE — Progress Notes (Signed)
Patient ID: Billy [name], male   DOB: 1961-01-07, 54 y.o.   MRN: [id number]  [facility]. MD consulted and verified. [facility] called and phlebotomy notified. [facility] to [facility]. Will follow up.   [facility] via [facility]. 10:48 AM  [facility]. Pt in no current distress at this time.

## 2014-09-20 NOTE — [facility] Group Notes (Signed)
[facility] LCSW Group Therapy  09/20/2014 1:12 PM  Type of Therapy:  Group Therapy  Participation Level:  Did Not Attend-pt in room sleeping/invited. He came to the last few minutes of group but did not actively participate in group discussion.   Summary of Progress/Problems: Today's Topic: Overcoming Obstacles. Pt identified obstacles faced currently and processed barriers involved in overcoming these obstacles. Pt identified steps necessary for overcoming these obstacles and explored motivation (internal and external) for facing these difficulties head on. Pt further identified one area of concern in their lives and chose a skill of focus pulled from their "toolbox."   Smart, [place] LCSWA  09/20/2014, 1:12 PM

## 2014-09-21 LAB — GLUCOSE, CAPILLARY: GLUCOSE-CAPILLARY: 145 mg/dL — AB (ref 70–99)

## 2014-09-21 NOTE — Progress Notes (Signed)
D: All discharge paperwork's verified for signatures. Norvasc sample of 10 mg added to Med sample pack. Pt received the following: AVS, Med samples, prescriptions, and documented belongings from admission. Pt's Bp and GERD medication given prior to discharge. Pt reminded of community numbers and the suicide help line for emergent situations. Pt had no questions for Clinical research associatewriter. Pt wished much success. Pt [facility] Bus passes [facility]
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
## 2014-09-25 NOTE — Progress Notes (Signed)
Patient Discharge Instructions:  After Visit Summary (AVS):   Faxed to:  09/25/14 Discharge Summary Note:   Faxed to:  09/25/14 Psychiatric Admission Assessment Note:   Faxed to:  09/25/14 Suicide Risk Assessment - Discharge Assessment:   Faxed to:  09/25/14 Faxed/Sent to the Next Level Care provider:  09/25/14 Faxed to [facility] - Dr. Concepcion ElkAvbuere @ [phone] [facility] @ [phone] [facility] to [facility] @ [phone] Jerelene ReddenSheena E Prom[place], 09/25/2014, 2:34 PM

## 2014-09-28 ENCOUNTER — Emergency Department (HOSPITAL_BASED_OUTPATIENT_CLINIC_OR_DEPARTMENT_OTHER): Payer: Medicare Other

## 2014-09-28 ENCOUNTER — Emergency Department (HOSPITAL_BASED_OUTPATIENT_CLINIC_OR_DEPARTMENT_OTHER)
Admission: EM | Admit: 2014-09-28 | Discharge: 2014-09-28 | Disposition: A | Discharge status: Home / Self Care | Payer: Medicare Other | Attending: Emergency Medicine | Admitting: Emergency Medicine

## 2014-09-28 ENCOUNTER — Encounter (HOSPITAL_BASED_OUTPATIENT_CLINIC_OR_DEPARTMENT_OTHER): Payer: Self-pay

## 2014-09-28 DIAGNOSIS — Z720 Tobacco use: Secondary | ICD-10-CM | POA: Diagnosis not present

## 2014-09-28 DIAGNOSIS — Z7901 Long term (current) use of anticoagulants: Secondary | ICD-10-CM | POA: Insufficient documentation

## 2014-09-28 DIAGNOSIS — E119 Type 2 diabetes mellitus without complications: Secondary | ICD-10-CM | POA: Diagnosis not present

## 2014-09-28 DIAGNOSIS — I10 Essential (primary) hypertension: Secondary | ICD-10-CM | POA: Diagnosis not present

## 2014-09-28 DIAGNOSIS — Z794 Long term (current) use of insulin: Secondary | ICD-10-CM | POA: Diagnosis not present

## 2014-09-28 DIAGNOSIS — B20 Human immunodeficiency virus [HIV] disease: Secondary | ICD-10-CM | POA: Diagnosis not present

## 2014-09-28 DIAGNOSIS — F319 Bipolar disorder, unspecified: Secondary | ICD-10-CM | POA: Diagnosis not present

## 2014-09-28 DIAGNOSIS — G8929 Other chronic pain: Secondary | ICD-10-CM | POA: Diagnosis not present

## 2014-09-28 DIAGNOSIS — Z86718 Personal history of other venous thrombosis and embolism: Secondary | ICD-10-CM | POA: Diagnosis not present

## 2014-09-28 DIAGNOSIS — Z915 Personal history of self-harm: Secondary | ICD-10-CM | POA: Insufficient documentation

## 2014-09-28 DIAGNOSIS — Z8619 Personal history of other infectious and parasitic diseases: Secondary | ICD-10-CM | POA: Diagnosis not present

## 2014-09-28 DIAGNOSIS — Z79899 Other long term (current) drug therapy: Secondary | ICD-10-CM | POA: Insufficient documentation

## 2014-09-28 DIAGNOSIS — R042 Hemoptysis: Secondary | ICD-10-CM | POA: Diagnosis not present

## 2014-09-28 LAB — BASIC METABOLIC PANEL
Anion gap: 6 (ref 5–15)
BUN: 14 mg/dL (ref 6–23)
CO2: 25 mmol/L (ref 19–32)
CREATININE: 0.75 mg/dL (ref 0.50–1.35)
Calcium: 9.4 mg/dL (ref 8.4–10.5)
Chloride: 109 mmol/L (ref 96–112)
Glucose, Bld: 176 mg/dL — ABNORMAL HIGH (ref 70–99)
Potassium: 4.2 mmol/L (ref 3.5–5.1)
SODIUM: 140 mmol/L (ref 135–145)

## 2014-09-28 LAB — CBC WITH DIFFERENTIAL/PLATELET
BASOS PCT: 1 % (ref 0–1)
Basophils Absolute: 0.1 10*3/uL (ref 0.0–0.1)
Eosinophils Absolute: 0.1 10*3/uL (ref 0.0–0.7)
Eosinophils Relative: 2 % (ref 0–5)
HCT: 46.2 % (ref 39.0–52.0)
Hemoglobin: 16 g/dL (ref 13.0–17.0)
Lymphocytes Relative: 26 % (ref 12–46)
Lymphs Abs: 1.7 10*3/uL (ref 0.7–4.0)
MCH: 33.2 pg (ref 26.0–34.0)
MCHC: 34.6 g/dL (ref 30.0–36.0)
MCV: 95.9 fL (ref 78.0–100.0)
MONOS PCT: 8 % (ref 3–12)
Monocytes Absolute: 0.5 10*3/uL (ref 0.1–1.0)
NEUTROS PCT: 63 % (ref 43–77)
Neutro Abs: 4.3 10*3/uL (ref 1.7–7.7)
PLATELETS: 154 10*3/uL (ref 150–400)
RBC: 4.82 MIL/uL (ref 4.22–5.81)
RDW: 13.3 % (ref 11.5–15.5)
WBC: 6.7 10*3/uL (ref 4.0–10.5)

## 2014-09-28 LAB — CBG MONITORING, ED: Glucose-Capillary: 159 mg/dL — ABNORMAL HIGH (ref 70–99)

## 2014-09-28 MED ORDER — IOHEXOL 300 MG/ML  SOLN
80.0000 mL | Freq: Once | INTRAMUSCULAR | Status: AC | PRN
  Administered 2014-09-28: 80 mL via INTRAVENOUS

## 2014-09-28 NOTE — ED Notes (Signed)
Pt resting quietly on stretcher, closing eyes at times, comfort measures provided

## 2014-09-28 NOTE — ED Notes (Signed)
Pt up to bathroom, ambulated w/o assistance, gait very steady

## 2014-09-28 NOTE — ED Notes (Signed)
Presents to ED, pt states has "issues" with DVT in Rt leg, states this am "I coughed up blood" (pt lying on stretcher, drinking water, and watching TV)
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
## 2014-09-28 NOTE — Discharge Instructions (Signed)
Return to the emergency department if you develop worsening bleeding, severe chest pain, difficulty breathing, or other new and concerning symptoms.   Hemoptysis Hemoptysis, which means coughing up blood, can be a sign of a minor problem or a serious medical condition. The blood that is coughed up may come from the lungs and airways. Coughed-up blood can also come from bleeding that occurs outside the lungs and airways. Blood can drain into the windpipe during a severe nosebleed or when blood is vomited from the stomach. Because hemoptysis can be a sign of something serious, a medical evaluation is required. For some people with hemoptysis, no definite cause is ever identified. CAUSES  The most common cause of hemoptysis is bronchitis. Some other common causes include:   A ruptured blood vessel caused by coughing or an infection.   A medical condition that causes damage to the large air passageways (bronchiectasis).   A blood clot in the lungs (pulmonary embolism).   Pneumonia.   Tuberculosis.   Breathing in a small foreign object.   Cancer. For some people with hemoptysis, no definite cause is ever identified.  HOME CARE INSTRUCTIONS  Only take over-the-counter or prescription medicines as directed by your caregiver. Do not use cough suppressants unless your caregiver approves.  If your caregiver prescribes antibiotic medicines, take them as directed. Finish them even if you start to feel better.  Do not smoke. Also avoid secondhand smoke.  Follow up with your caregiver as directed. SEEK IMMEDIATE MEDICAL CARE IF:   You cough up bloody mucus for longer than a week.  You have a blood-producing cough that is severe or getting worse.  You have a blood-producing cough thatcomes and goes over time.  You develop problems with your breathing.   You vomit blood.  You develop bloody or black-colored stools.  You have chest pain.   You develop night sweats.  You feel  faint or pass out.   You have a fever or persistent symptoms for more than 2-3 days.  You have a fever and your symptoms suddenly get worse. MAKE SURE YOU:  Understand these instructions.  Will watch your condition.  Will get help right away if you are not doing well or get worse. Document Released: 07/28/2001 Document Revised: 05/05/2012 Document Reviewed: 03/05/2012 [facility] Patient Information 2015 [place]. This information is not intended to replace advice given to you by your health care provider. Make sure you discuss any questions you have with your health care provider.

## 2014-09-28 NOTE — ED Notes (Signed)
[facility]  EDP informed.

## 2014-09-28 NOTE — ED Notes (Signed)
Patient transported to X-ray 

## 2014-11-16 ENCOUNTER — Ambulatory Visit (HOSPITAL_COMMUNITY): Payer: Self-pay | Admitting: Psychiatry

## 2015-02-05 ENCOUNTER — Encounter (HOSPITAL_COMMUNITY): Payer: Self-pay | Admitting: Emergency Medicine

## 2015-02-05 ENCOUNTER — Emergency Department (HOSPITAL_COMMUNITY)
Admission: EM | Admit: 2015-02-05 | Discharge: 2015-02-05 | Disposition: A | Discharge status: Home / Self Care | Payer: Medicare Other | Attending: Emergency Medicine | Admitting: Emergency Medicine

## 2015-02-05 DIAGNOSIS — E119 Type 2 diabetes mellitus without complications: Secondary | ICD-10-CM | POA: Insufficient documentation

## 2015-02-05 DIAGNOSIS — F10120 Alcohol abuse with intoxication, uncomplicated: Secondary | ICD-10-CM | POA: Insufficient documentation

## 2015-02-05 DIAGNOSIS — Z590 Homelessness: Secondary | ICD-10-CM | POA: Insufficient documentation

## 2015-02-05 DIAGNOSIS — Z720 Tobacco use: Secondary | ICD-10-CM | POA: Insufficient documentation

## 2015-02-05 DIAGNOSIS — Z79899 Other long term (current) drug therapy: Secondary | ICD-10-CM | POA: Diagnosis not present

## 2015-02-05 DIAGNOSIS — I10 Essential (primary) hypertension: Secondary | ICD-10-CM | POA: Diagnosis not present

## 2015-02-05 DIAGNOSIS — F1410 Cocaine abuse, uncomplicated: Secondary | ICD-10-CM | POA: Insufficient documentation

## 2015-02-05 DIAGNOSIS — F319 Bipolar disorder, unspecified: Secondary | ICD-10-CM | POA: Diagnosis not present

## 2015-02-05 DIAGNOSIS — F1910 Other psychoactive substance abuse, uncomplicated: Secondary | ICD-10-CM

## 2015-02-05 DIAGNOSIS — F10129 Alcohol abuse with intoxication, unspecified: Secondary | ICD-10-CM | POA: Diagnosis present

## 2015-02-05 DIAGNOSIS — G8929 Other chronic pain: Secondary | ICD-10-CM | POA: Diagnosis not present

## 2015-02-05 DIAGNOSIS — Z794 Long term (current) use of insulin: Secondary | ICD-10-CM | POA: Diagnosis not present

## 2015-02-05 DIAGNOSIS — F1110 Opioid abuse, uncomplicated: Secondary | ICD-10-CM | POA: Insufficient documentation

## 2015-02-05 DIAGNOSIS — Z915 Personal history of self-harm: Secondary | ICD-10-CM | POA: Insufficient documentation

## 2015-02-05 DIAGNOSIS — F10929 Alcohol use, unspecified with intoxication, unspecified: Secondary | ICD-10-CM

## 2015-02-05 NOTE — ED Notes (Signed)
When assessing pt questioned him if he had and ETOH today. Pt agreed that he has drank today. When gathering further assessment and asked pt how much he drank today he responded with "not enough." Pt would not give a clear answer on how much he drank. Pt does deny using any drugs today although he admitted to EMS that he used Cocaine today.

## 2015-02-05 NOTE — ED Notes (Signed)
Report given to Fleet Contras, RN in [place]. Sitter remains at bedside.

## 2015-02-05 NOTE — ED Notes (Signed)
Pt ambulated with no difficulty

## 2015-02-05 NOTE — ED Provider Notes (Signed)
54-year-old male here for intoxication. Reexamination patient is alert oriented able ambulate without difficulty. Has a plan to take the bus home to a friend's house where stay tonight. Doubt any head injury or other concern. Counseled him on reducing his alcohol and drug intake.  Marily Memos, MD 02/05/15 2022

## 2015-02-05 NOTE — ED Provider Notes (Signed)
CSN: [id number]     Arrival date & time 02/05/15  1337 History   First MD Initiated Contact with Patient 02/05/15 1501     Chief Complaint  Patient presents with  . Alcohol Intoxication  . Cocaine Use      (Consider location/radiation/quality/duration/timing/severity/associated sxs/prior Treatment) HPI   Billy [name] is a 54 y.o. male  who presents for evaluation of altered mental status, by EMS. He is not sure who called the ambulance. He admits to using heroin, cocaine and drinking alcohol today. He states that he is receiving help from a substance abuse facility, which has placed him into a hotel. He denies headache, weakness or dizziness. He states that he is hungry. There are no other known modifying factors.  Past Medical History  Diagnosis Date  . Diabetes mellitus   . Hepatitis C carrier   . WPW (Wolff-Parkinson-White syndrome)   . Depression   . Chronic pain   . Suicide attempt   . Diabetes mellitus without complication   . Hypertension   . Bipolar 1 disorder   . HCV (hepatitis C virus)   . Homeless    Past Surgical History  Procedure Laterality Date  . Rhinoplasty    . Chest surgery    . Back surgery    . Tonsillectomy     History reviewed. No pertinent family history. Social History  Substance Use Topics  . Smoking status: Current Every Day Smoker -- 0.50 packs/day for 8 years    Types: Cigarettes  . Smokeless tobacco: None  . Alcohol Use: Yes     Comment: last drink 24 days    Review of Systems  All other systems reviewed and are negative.     Allergies  Review of patient's allergies indicates no known allergies.  Home Medications   Prior to Admission medications   Medication Sig Start Date End Date Taking? Authorizing Provider  albuterol (PROAIR HFA) 108 (90 BASE) MCG/ACT inhaler Inhale 2 puffs into the lungs every 6 (six) hours as needed for shortness of breath. 09/20/14  Yes Shuvon B Rankin, NP  amLODipine (NORVASC) 10 MG tablet Take 1  tablet (10 mg total) by mouth daily. 09/21/14  Yes Shuvon B Rankin, NP  ARIPiprazole (ABILIFY) 15 MG tablet Take 0.5 tablets (7.5 mg total) by mouth 2 (two) times daily. For hallucination 09/20/14  Yes Shuvon B Rankin, NP  citalopram (CELEXA) 20 MG tablet Take 1 tablet (20 mg total) by mouth daily. For depression 09/20/14  Yes Shuvon B Rankin, NP  gabapentin (NEURONTIN) 300 MG capsule Take 2 capsules (600 mg total) by mouth 3 (three) times daily. 09/20/14  Yes Shuvon B Rankin, NP  gabapentin (NEURONTIN) 400 MG capsule Take 3 capsules by mouth 3 (three) times daily. 12/12/14  Yes Historical Provider, MD  insulin glargine (LANTUS) 100 UNIT/ML injection Inject 30 Units into the skin at bedtime. Takes 40 units of lantus if blood sugar over 200   Yes Historical Provider, MD  lisinopril (PRINIVIL,ZESTRIL) 20 MG tablet Take 1 tablet (20 mg total) by mouth daily. For high blood pressure 09/20/14  Yes Shuvon B Rankin, NP  oxymetazoline (AFRIN) 0.05 % nasal spray Place 1 spray into both nostrils 2 (two) times daily as needed for congestion. 09/20/14  Yes Shuvon B Rankin, NP  pantoprazole (PROTONIX) 40 MG tablet Take 40 mg by mouth daily.   Yes Historical Provider, MD  pravastatin (PRAVACHOL) 10 MG tablet Take 10 mg by mouth at bedtime. 01/01/15  Yes Historical Provider, MD  tiotropium (SPIRIVA) 18 MCG inhalation capsule Place 1 capsule (18 mcg total) into inhaler and inhale daily. For COPD 09/20/14  Yes Shuvon B Rankin, NP  tiZANidine (ZANAFLEX) 2 MG tablet Take 4 mg by mouth 3 (three) times daily. 12/12/14  Yes Historical Provider, MD  traZODone (DESYREL) 50 MG tablet Take 1 tablet (50 mg total) by mouth at bedtime as needed for sleep. 09/20/14  Yes Shuvon B Rankin, NP  docusate sodium (COLACE) 100 MG capsule Take 1 capsule (100 mg total) by mouth 2 (two) times daily. For constipation Patient not taking: Reported on 02/05/2015 09/20/14   Shuvon B Rankin, NP  tiZANidine (ZANAFLEX) 4 MG tablet Take 1 tablet (4 mg total) by  mouth 3 (three) times daily. For muscle spasticity Patient not taking: Reported on 02/05/2015 09/20/14   Shuvon B Rankin, NP   BP 142/85 mmHg  Pulse 95  Temp(Src) 97.6 F (36.4 C) (Oral)  Resp 20  SpO2 96% Physical Exam  Constitutional: He is oriented to person, place, and time. He appears well-developed and well-nourished.  HENT:  Head: Normocephalic and atraumatic.  Right Ear: External ear normal.  Left Ear: External ear normal.  Eyes: Conjunctivae and EOM are normal. Pupils are equal, round, and reactive to light.  Neck: Normal range of motion and phonation normal. Neck supple.  Cardiovascular: Normal rate, regular rhythm and normal heart sounds.   Pulmonary/Chest: Effort normal and breath sounds normal. He exhibits no bony tenderness.  Abdominal: Soft. There is no tenderness.  Musculoskeletal: Normal range of motion.  Neurological: He is alert and oriented to person, place, and time. No cranial nerve deficit or sensory deficit. He exhibits normal muscle tone. Coordination normal.  Dysarthria consistent with alcohol intoxication. No aphasia or nystagmus.  Skin: Skin is warm, dry and intact.  Psychiatric: He has a normal mood and affect. His behavior is normal. Judgment and thought content normal.  Nursing note and vitals reviewed.   ED Course  Procedures (including critical care time)  Patient is intoxicated, does not appear to need any specific intervention at this time. Is no documented history of alcohol withdrawal seizures or delirium tremens. Patient is moderately intoxicated. Therefore, therefore he will require observation, until he is sober, at which time, discharge can be considered.  Medications - No data to display  Patient Vitals for the past 24 hrs:  BP Temp Temp src Pulse Resp SpO2  02/05/15 1349 142/85 mmHg 97.6 F (36.4 C) Oral 95 20 96 %      Polysubstance abuse, with intoxication.  Labs Review Labs Reviewed - No data to display  Imaging Review No  results found. I have personally reviewed and evaluated these images and lab results as part of my medical decision-making.   EKG Interpretation None      MDM   Final diagnoses:  Alcohol intoxication, with unspecified complication  Polysubstance abuse    Polysubstance abuse with intoxication. He required observation.   Nursing Notes Reviewed/ Care Coordinated Applicable Imaging Reviewed Interpretation of Laboratory Data incorporated into ED treatment  Plan: as per oncoming provider team    Mancel Bale, MD 02/06/15 [phone]

## 2015-02-05 NOTE — ED Notes (Signed)
Tried to introduce self to pt but pt very sleepy.  MD made rounds, pt still asleep.  MD asks to be called when pt awake, walking around and seems sober. Billy [name]

## 2015-02-05 NOTE — ED Notes (Signed)
Bed: [facility] Expected date:  Expected time:  Means of arrival:  Comments: EMS- ETOH/Cocaine Intoxication

## 2015-02-05 NOTE — ED Notes (Signed)
Pt comes in today with EMS. The friend called EMS for a c/o unresponsive possible cocaine and heroin overdose. Pt is intoxicated. Pt admits to cocaine use today but could not confirm whether or not he used heroin.

## 2015-06-08 ENCOUNTER — Encounter: Payer: Self-pay | Admitting: Internal Medicine

## 2015-07-27 ENCOUNTER — Encounter: Payer: Self-pay | Admitting: Internal Medicine

## 2015-08-08 ENCOUNTER — Encounter (HOSPITAL_COMMUNITY): Payer: Self-pay

## 2015-08-08 ENCOUNTER — Emergency Department (HOSPITAL_COMMUNITY)
Admission: EM | Admit: 2015-08-08 | Discharge: 2015-08-09 | Disposition: A | Discharge status: Other Acute Inpatient Hospital | Payer: Medicare Other | Attending: Emergency Medicine | Admitting: Emergency Medicine

## 2015-08-08 DIAGNOSIS — F1410 Cocaine abuse, uncomplicated: Secondary | ICD-10-CM | POA: Diagnosis not present

## 2015-08-08 DIAGNOSIS — E119 Type 2 diabetes mellitus without complications: Secondary | ICD-10-CM | POA: Insufficient documentation

## 2015-08-08 DIAGNOSIS — F17210 Nicotine dependence, cigarettes, uncomplicated: Secondary | ICD-10-CM | POA: Diagnosis not present

## 2015-08-08 DIAGNOSIS — F32A Depression, unspecified: Secondary | ICD-10-CM

## 2015-08-08 DIAGNOSIS — Z8619 Personal history of other infectious and parasitic diseases: Secondary | ICD-10-CM | POA: Diagnosis not present

## 2015-08-08 DIAGNOSIS — F3130 Bipolar disorder, current episode depressed, mild or moderate severity, unspecified: Secondary | ICD-10-CM | POA: Diagnosis not present

## 2015-08-08 DIAGNOSIS — I10 Essential (primary) hypertension: Secondary | ICD-10-CM | POA: Insufficient documentation

## 2015-08-08 DIAGNOSIS — Z79899 Other long term (current) drug therapy: Secondary | ICD-10-CM | POA: Insufficient documentation

## 2015-08-08 DIAGNOSIS — Z590 Homelessness: Secondary | ICD-10-CM | POA: Diagnosis not present

## 2015-08-08 DIAGNOSIS — F329 Major depressive disorder, single episode, unspecified: Secondary | ICD-10-CM

## 2015-08-08 DIAGNOSIS — Z794 Long term (current) use of insulin: Secondary | ICD-10-CM | POA: Diagnosis not present

## 2015-08-08 DIAGNOSIS — R45851 Suicidal ideations: Secondary | ICD-10-CM

## 2015-08-08 DIAGNOSIS — G8929 Other chronic pain: Secondary | ICD-10-CM | POA: Diagnosis not present

## 2015-08-08 DIAGNOSIS — I456 Pre-excitation syndrome: Secondary | ICD-10-CM | POA: Diagnosis not present

## 2015-08-08 LAB — RAPID URINE DRUG SCREEN, HOSP PERFORMED
Amphetamines: NOT DETECTED
BENZODIAZEPINES: NOT DETECTED
Barbiturates: NOT DETECTED
Cocaine: POSITIVE — AB
Opiates: NOT DETECTED
Tetrahydrocannabinol: NOT DETECTED

## 2015-08-08 LAB — CBG MONITORING, ED
Glucose-Capillary: 179 mg/dL — ABNORMAL HIGH (ref 65–99)
Glucose-Capillary: 267 mg/dL — ABNORMAL HIGH (ref 65–99)
Glucose-Capillary: 110 mg/dL — ABNORMAL HIGH (ref 65–99)

## 2015-08-08 LAB — CBC
HCT: 48.8 % (ref 39.0–52.0)
Hemoglobin: 16.4 g/dL (ref 13.0–17.0)
MCH: 31.5 pg (ref 26.0–34.0)
MCHC: 33.6 g/dL (ref 30.0–36.0)
MCV: 93.7 fL (ref 78.0–100.0)
PLATELETS: 165 10*3/uL (ref 150–400)
RBC: 5.21 MIL/uL (ref 4.22–5.81)
RDW: 13.2 % (ref 11.5–15.5)
WBC: 6.2 10*3/uL (ref 4.0–10.5)

## 2015-08-08 LAB — SALICYLATE LEVEL: Salicylate Lvl: <4 mg/dL (ref 2.8–30.0)

## 2015-08-08 LAB — COMPREHENSIVE METABOLIC PANEL
ALK PHOS: 50 U/L (ref 38–126)
ALT: 52 U/L (ref 17–63)
ANION GAP: 10 (ref 5–15)
AST: 40 U/L (ref 15–41)
Albumin: 4.4 g/dL (ref 3.5–5.0)
BILIRUBIN TOTAL: 1 mg/dL (ref 0.3–1.2)
BUN: 10 mg/dL (ref 6–20)
CALCIUM: 9.6 mg/dL (ref 8.9–10.3)
CO2: 24 mmol/L (ref 22–32)
Chloride: 109 mmol/L (ref 101–111)
Creatinine, Ser: 0.95 mg/dL (ref 0.61–1.24)
GFR calc Af Amer: >60 mL/min (ref >60)
Glucose, Bld: 168 mg/dL — ABNORMAL HIGH (ref 65–99)
POTASSIUM: 3.9 mmol/L (ref 3.5–5.1)
Sodium: 143 mmol/L (ref 135–145)
TOTAL PROTEIN: 8 g/dL (ref 6.5–8.1)

## 2015-08-08 LAB — ETHANOL

## 2015-08-08 LAB — ACETAMINOPHEN LEVEL

## 2015-08-08 MED ORDER — TIOTROPIUM BROMIDE MONOHYDRATE 18 MCG IN CAPS
18.0000 ug | ORAL_CAPSULE | Freq: Every day | RESPIRATORY_TRACT | Status: DC
  Administered 2015-08-08 – 2015-08-09 (×2): 18 ug via RESPIRATORY_TRACT
  Filled 2015-08-08: qty 5

## 2015-08-08 MED ORDER — CITALOPRAM HYDROBROMIDE 20 MG PO TABS
20.0000 mg | ORAL_TABLET | Freq: Every day | ORAL | Status: DC
  Administered 2015-08-08 – 2015-08-09 (×2): 20 mg via ORAL
  Filled 2015-08-08 (×2): qty 1

## 2015-08-08 MED ORDER — ALBUTEROL SULFATE HFA 108 (90 BASE) MCG/ACT IN AERS: 2.0000 | INHALATION_SPRAY | Freq: Four times a day (QID) | RESPIRATORY_TRACT | Status: DC | PRN

## 2015-08-08 MED ORDER — OXYMETAZOLINE HCL 0.05 % NA SOLN: 1.0000 | Freq: Two times a day (BID) | NASAL | Status: DC | PRN

## 2015-08-08 MED ORDER — ARIPIPRAZOLE 15 MG PO TABS
7.5000 mg | ORAL_TABLET | Freq: Two times a day (BID) | ORAL | Status: DC
  Administered 2015-08-08 – 2015-08-09 (×3): 7.5 mg via ORAL
  Filled 2015-08-08 (×4): qty 1

## 2015-08-08 MED ORDER — ACETAMINOPHEN 325 MG PO TABS: 650.0000 mg | ORAL_TABLET | ORAL | Status: DC | PRN

## 2015-08-08 MED ORDER — TRAZODONE HCL 50 MG PO TABS
50.0000 mg | ORAL_TABLET | Freq: Every evening | ORAL | Status: DC | PRN
  Administered 2015-08-08: 50 mg via ORAL
  Filled 2015-08-08: qty 1

## 2015-08-08 MED ORDER — PRAVASTATIN SODIUM 10 MG PO TABS
10.0000 mg | ORAL_TABLET | Freq: Every day | ORAL | Status: DC
  Administered 2015-08-08: 10 mg via ORAL
  Filled 2015-08-08 (×2): qty 1

## 2015-08-08 MED ORDER — ONDANSETRON HCL 4 MG PO TABS: 4.0000 mg | ORAL_TABLET | Freq: Three times a day (TID) | ORAL | Status: DC | PRN

## 2015-08-08 MED ORDER — GABAPENTIN 400 MG PO CAPS
1200.0000 mg | ORAL_CAPSULE | Freq: Three times a day (TID) | ORAL | Status: DC
  Administered 2015-08-08 – 2015-08-09 (×4): 1200 mg via ORAL
  Filled 2015-08-08 (×4): qty 3

## 2015-08-08 MED ORDER — AMLODIPINE BESYLATE 10 MG PO TABS
10.0000 mg | ORAL_TABLET | Freq: Every day | ORAL | Status: DC
  Administered 2015-08-08 – 2015-08-09 (×2): 10 mg via ORAL
  Filled 2015-08-08 (×2): qty 1

## 2015-08-08 MED ORDER — PANTOPRAZOLE SODIUM 40 MG PO TBEC
40.0000 mg | DELAYED_RELEASE_TABLET | Freq: Every day | ORAL | Status: DC
  Administered 2015-08-08 – 2015-08-09 (×2): 40 mg via ORAL
  Filled 2015-08-08 (×2): qty 1

## 2015-08-08 MED ORDER — INSULIN ASPART 100 UNIT/ML ~~LOC~~ SOLN: 3.0000 [IU] | Freq: Three times a day (TID) | SUBCUTANEOUS | Status: DC

## 2015-08-08 MED ORDER — LISINOPRIL 20 MG PO TABS
20.0000 mg | ORAL_TABLET | Freq: Every day | ORAL | Status: DC
  Administered 2015-08-08 – 2015-08-09 (×2): 20 mg via ORAL
  Filled 2015-08-08 (×2): qty 1

## 2015-08-08 MED ORDER — INSULIN ASPART 100 UNIT/ML ~~LOC~~ SOLN
0.0000 [IU] | Freq: Three times a day (TID) | SUBCUTANEOUS | Status: DC
  Administered 2015-08-08: 8 [IU] via SUBCUTANEOUS
  Administered 2015-08-09 (×2): 3 [IU] via SUBCUTANEOUS
  Filled 2015-08-08 (×3): qty 1

## 2015-08-08 MED ORDER — INSULIN GLARGINE 100 UNIT/ML ~~LOC~~ SOLN
60.0000 [IU] | Freq: Every day | SUBCUTANEOUS | Status: DC
  Filled 2015-08-08 (×2): qty 0.6
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
## 2015-08-08 NOTE — BH Assessment (Signed)
Per Josephine, NP - patient meets criteria for inpatient hospitalization.   

## 2015-08-08 NOTE — BH Assessment (Addendum)
Tele Assessment Note   Patient is a 55 year old male with suicidal ideation and a plan to cut his throat with a razor or jumping into traffic.  Patient reports that he had access to a razor; however, the hospital took the razor from him.   Patient reports increased depression due to living in a boarding room that had bed bugs.  Patient reports previous suicide attempts in the past.  Patient reports that he does not have family support.  Patient reports that he would rather be dead than to continue to live like this.   Patient reports receiving medication management and outpatient therapy from [facility] [place].  Patient reports that he has not taken his psychiatric medication in three weeks.   Patient reports occasional cocaine abuse.  Patient reports his last use was Sunday.  Patient UDS is positive for cocaine and his BAL is <5.  Patient denies seizures and withdrawal symptoms.    Patient denies HI and psychosis.  Patient denies physical, sexual or emotional abuse.     Diagnosis: Bipolar Disorder   Past Medical History:  Past Medical History  Diagnosis Date  . Diabetes mellitus   . Hepatitis C carrier   . WPW (Wolff-Parkinson-White syndrome)   . Depression   . Chronic pain   . Suicide attempt (HCC)   . Diabetes mellitus without complication (HCC)   . Hypertension   . Bipolar 1 disorder (HCC)   . HCV (hepatitis C virus)   . Homeless     Past Surgical History  Procedure Laterality Date  . Rhinoplasty    . Chest surgery    . Back surgery    . Tonsillectomy      Family History: History reviewed. No pertinent family history.  Social History:  reports that he has been smoking Cigarettes.  He has a 4 pack-year smoking history. He does not have any smokeless tobacco history on file. He reports that he drinks alcohol. He reports that he uses illicit drugs (Marijuana, "Crack" cocaine, and Cocaine).  Additional Social History:  Alcohol / Drug Use History of alcohol  / drug use?: Yes Longest period of sobriety (when/how long): 10 years sober  Negative Consequences of Use: Financial, Personal relationships, Work / School Withdrawal Symptoms:  (None Reported) Substance #1 Name of Substance 1: Cocaine  1 - Age of First Use: 55 years old  1 - Amount (size/oz): Half a gram  1 - Frequency: Monthly 1 - Duration: On and off all of his life 1 - Last Use / Amount: Sunday, he used a half a gram   CIWA: CIWA-Ar BP: 158/100 mmHg Pulse Rate: (!) 55 COWS:    PATIENT STRENGTHS: (choose at least two) Average or above average intelligence Communication skills Financial means  Allergies: No Known Allergies  Home Medications:  (Not in a hospital admission)  OB/GYN Status:  No LMP for male patient.  General Assessment Data Location of Assessment: [facility] TTS Assessment: In system Is this a Tele or Face-to-Face Assessment?: Tele Assessment Is this an Initial Assessment or a Re-assessment for this encounter?: Initial Assessment Marital status: Divorced [place] name: NA Is patient pregnant?: No Pregnancy Status: No Living Arrangements:  (Homeless for one week, he was previously living in a bording) Can pt return to current living arrangement?: Yes Admission Status: Voluntary Is patient capable of signing voluntary admission?: Yes Referral Source: Self/Family/Friend Insurance type: Medicare     Crisis Care Plan Living Arrangements:  (Homeless for one week, he was previously  living in a bording) Legal Guardian:  (NA) Name of Psychiatrist: [facility]  Name of Therapist: [facility]  (Patton Sallesnita Jones in [place] )  Education Status Is patient currently in school?: No Current Grade: NA Highest grade of school patient has completed: 12 Name of school: NA Contact person: NA  Risk to self with the past 6 months Suicidal Ideation: Yes-Currently Present Has patient been a risk to self within the past 6 months prior  to admission? : Yes Suicidal Intent: Yes-Currently Present Has patient had any suicidal intent within the past 6 months prior to admission? : Yes Is patient at risk for suicide?: Yes Suicidal Plan?: Yes-Currently Present Has patient had any suicidal plan within the past 6 months prior to admission? : Yes Specify Current Suicidal Plan: Cut his throat Access to Means: Yes Specify Access to Suicidal Means: Razor Blade What has been your use of drugs/alcohol within the last 12 months?: Cocaine Previous Attempts/Gestures: Yes How many times?: 9 Other Self Harm Risks: None Triggers for Past Attempts: Unpredictable Intentional Self Injurious Behavior: None Family Suicide History: No Recent stressful life event(s): Financial Problems (Bed Bugs in his boarding house ) Persecutory voices/beliefs?: No Depression: Yes Depression Symptoms: Despondent, Fatigue, Guilt, Loss of interest in usual pleasures, Feeling worthless/self pity, Insomnia Substance abuse history and/or treatment for substance abuse?: Yes Suicide prevention information given to non-admitted patients: Yes  Risk to Others within the past 6 months Homicidal Ideation: No Does patient have any lifetime risk of violence toward others beyond the six months prior to admission? : No Thoughts of Harm to Others: No Current Homicidal Intent: No Current Homicidal Plan: No Access to Homicidal Means: No Identified Victim: None History of harm to others?: No Assessment of Violence: None Noted Violent Behavior Description: None Does patient have access to weapons?: No Criminal Charges Pending?: No Does patient have a court date: No Is patient on probation?: No  Psychosis Hallucinations: None noted Delusions: None noted  Mental Status Report Appearance/Hygiene: Disheveled, Poor hygiene Eye Contact: Fair Motor Activity: Freedom of movement Speech: Logical/coherent Level of Consciousness: Alert, Restless Mood: Depressed, Helpless,  Sad Affect: Anxious Anxiety Level: Minimal Thought Processes: Coherent, Relevant Judgement: Unimpaired Orientation: Person, Place, Time, Situation Obsessive Compulsive Thoughts/Behaviors: None  Cognitive Functioning Concentration: Decreased Memory: Recent Intact, Remote Intact IQ: Average Insight: Fair Impulse Control: Good Appetite: Fair Weight Loss: 0 Weight Gain: 0 Sleep: Decreased Total Hours of Sleep: 2 Vegetative Symptoms: Decreased grooming, Not bathing  ADLScreening [facility]) Patient's cognitive ability adequate to safely complete daily activities?: Yes Patient able to express need for assistance with ADLs?: Yes Independently performs ADLs?: Yes (appropriate for developmental age)  Prior Inpatient Therapy Prior Inpatient Therapy: Yes Prior Therapy Dates: 2015 [facility] Reason for Treatment: Si  Prior Outpatient Therapy Prior Outpatient Therapy: Yes Prior Therapy Dates: Ongoing  Prior Therapy Facilty/Provider(s): [facility]  Reason for Treatment: Medication Mgt and OPT Does patient have an ACCT team?: No Does patient have Intensive In-House Services?  : No Does patient have [facility] services? : No Does patient have P4CC services?: No  ADL Screening (condition at time of admission) Patient's cognitive ability adequate to safely complete daily activities?: Yes Is the patient deaf or have difficulty hearing?: No Does the patient have difficulty seeing, even when wearing glasses/contacts?: No Does the patient have difficulty concentrating, remembering, or making decisions?: No Patient able to express need for assistance with ADLs?: Yes Does the  patient have difficulty dressing or bathing?: No Independently performs ADLs?: Yes (appropriate for developmental age) Does the patient have difficulty walking or climbing stairs?: Yes Weakness of Legs: None Weakness of Arms/Hands: None  Home Assistive  Devices/Equipment Home Assistive Devices/Equipment: None    Abuse/Neglect Assessment (Assessment to be complete while patient is alone) Physical Abuse: Denies Verbal Abuse: Denies Sexual Abuse: Denies Exploitation of patient/patient's resources: Denies Self-Neglect: Denies Values / Beliefs Cultural Requests During Hospitalization: None Spiritual Requests During Hospitalization: None Consults Spiritual Care Consult Needed: No Social Work Consult Needed: No Merchant navy officer (For Healthcare) Does patient have an advance directive?: No Would patient like information on creating an advanced directive?: No - patient declined information    Additional Information 1:1 In Past 12 Months?: No CIRT Risk: No Elopement Risk: No Does patient have medical clearance?: Yes     Disposition: Per Julieanne Cotton, NP - patient meets criteria for inpatient hospitalization.  TTS will seek placement.  Disposition Initial Assessment Completed for this Encounter: Yes  Phillip Heal LaVerne 08/08/2015 11:15 AM

## 2015-08-08 NOTE — ED Notes (Signed)
Contacted EDP to put order in for parameters to administer sliding scale insulin.

## 2015-08-08 NOTE — ED Notes (Addendum)
Pt here with suicidal thoughts.  Pt has hx of same. States symptoms ongoing.  Plan to jump in front of bus.  No changes recently to increase thoughts.  Has had treatment in the past along with attempts.  Pt is not taking any of his meds including his bp meds.  Hx cocaine abuse

## 2015-08-08 NOTE — ED Notes (Signed)
Contacted EDP again to obtain order for parameters to administer sliding scale insulin, awaiting for order entry at this time.

## 2015-08-08 NOTE — ED Notes (Signed)
Pt admitted to room #43. Pt reports he is here because of "depression" Pt reports prior to admission he was suicidal with a plan to cut  his throat.Pt currently reports passive SI, but reports he feels safe in the hospital. Pt denies HI. Pt denies AVH. Pt reports he has been off his medication for a month because he can not afford them. Pt reports he is currently homeless and has been for the past 4 days. Reports he was living in a boarding house "filthy fuckin place" Pt reports he uses cocaine. Denies alcohol use. Reports he receives disability income. Pt [facility] as a support system. Special checks q 15 mins intiated for safety. Will continue to monitor.

## 2015-08-09 ENCOUNTER — Encounter: Payer: Self-pay | Admitting: Internal Medicine

## 2015-08-09 ENCOUNTER — Encounter (HOSPITAL_COMMUNITY): Payer: Self-pay

## 2015-08-09 ENCOUNTER — Inpatient Hospital Stay (HOSPITAL_COMMUNITY)
Admission: AD | Admit: 2015-08-09 | Discharge: 2015-08-15 | DRG: 885 | Disposition: A | Discharge status: Home / Self Care | Payer: Medicare Other | Source: Intra-hospital | Attending: Psychiatry | Admitting: Psychiatry

## 2015-08-09 DIAGNOSIS — G4700 Insomnia, unspecified: Secondary | ICD-10-CM | POA: Diagnosis present

## 2015-08-09 DIAGNOSIS — G894 Chronic pain syndrome: Secondary | ICD-10-CM | POA: Diagnosis present

## 2015-08-09 DIAGNOSIS — F319 Bipolar disorder, unspecified: Secondary | ICD-10-CM | POA: Diagnosis present

## 2015-08-09 DIAGNOSIS — F3130 Bipolar disorder, current episode depressed, mild or moderate severity, unspecified: Secondary | ICD-10-CM | POA: Diagnosis present

## 2015-08-09 DIAGNOSIS — I10 Essential (primary) hypertension: Secondary | ICD-10-CM | POA: Diagnosis present

## 2015-08-09 DIAGNOSIS — B182 Chronic viral hepatitis C: Secondary | ICD-10-CM | POA: Diagnosis present

## 2015-08-09 DIAGNOSIS — F17210 Nicotine dependence, cigarettes, uncomplicated: Secondary | ICD-10-CM | POA: Diagnosis present

## 2015-08-09 DIAGNOSIS — E7800 Pure hypercholesterolemia, unspecified: Secondary | ICD-10-CM | POA: Diagnosis present

## 2015-08-09 DIAGNOSIS — F410 Panic disorder [episodic paroxysmal anxiety] without agoraphobia: Secondary | ICD-10-CM | POA: Diagnosis present

## 2015-08-09 DIAGNOSIS — F1420 Cocaine dependence, uncomplicated: Secondary | ICD-10-CM | POA: Diagnosis present

## 2015-08-09 DIAGNOSIS — J45909 Unspecified asthma, uncomplicated: Secondary | ICD-10-CM | POA: Diagnosis present

## 2015-08-09 DIAGNOSIS — E119 Type 2 diabetes mellitus without complications: Secondary | ICD-10-CM | POA: Diagnosis present

## 2015-08-09 DIAGNOSIS — J449 Chronic obstructive pulmonary disease, unspecified: Secondary | ICD-10-CM | POA: Diagnosis present

## 2015-08-09 DIAGNOSIS — F1410 Cocaine abuse, uncomplicated: Secondary | ICD-10-CM | POA: Diagnosis not present

## 2015-08-09 DIAGNOSIS — R45851 Suicidal ideations: Secondary | ICD-10-CM | POA: Diagnosis present

## 2015-08-09 DIAGNOSIS — E785 Hyperlipidemia, unspecified: Secondary | ICD-10-CM | POA: Diagnosis present

## 2015-08-09 DIAGNOSIS — K219 Gastro-esophageal reflux disease without esophagitis: Secondary | ICD-10-CM | POA: Diagnosis present

## 2015-08-09 DIAGNOSIS — F314 Bipolar disorder, current episode depressed, severe, without psychotic features: Secondary | ICD-10-CM | POA: Diagnosis not present

## 2015-08-09 LAB — GLUCOSE, CAPILLARY: Glucose-Capillary: 194 mg/dL — ABNORMAL HIGH (ref 65–99)

## 2015-08-09 LAB — CBG MONITORING, ED
Glucose-Capillary: 166 mg/dL — ABNORMAL HIGH (ref 65–99)
Glucose-Capillary: 159 mg/dL — ABNORMAL HIGH (ref 65–99)

## 2015-08-09 MED ORDER — PANTOPRAZOLE SODIUM 40 MG PO TBEC
40.0000 mg | DELAYED_RELEASE_TABLET | Freq: Every day | ORAL | Status: DC
  Administered 2015-08-10 – 2015-08-15 (×6): 40 mg via ORAL
  Filled 2015-08-09 (×9): qty 1

## 2015-08-09 MED ORDER — MAGNESIUM HYDROXIDE 400 MG/5ML PO SUSP
30.0000 mL | Freq: Every day | ORAL | Status: DC | PRN
  Administered 2015-08-13: 30 mL via ORAL
  Filled 2015-08-09: qty 30

## 2015-08-09 MED ORDER — ACETAMINOPHEN 325 MG PO TABS: 650.0000 mg | ORAL_TABLET | ORAL | Status: DC | PRN

## 2015-08-09 MED ORDER — TIOTROPIUM BROMIDE MONOHYDRATE 18 MCG IN CAPS
18.0000 ug | ORAL_CAPSULE | Freq: Every day | RESPIRATORY_TRACT | Status: DC
  Administered 2015-08-11 – 2015-08-15 (×3): 18 ug via RESPIRATORY_TRACT
  Filled 2015-08-09: qty 5

## 2015-08-09 MED ORDER — GABAPENTIN 400 MG PO CAPS
1200.0000 mg | ORAL_CAPSULE | Freq: Three times a day (TID) | ORAL | Status: DC
  Administered 2015-08-09 – 2015-08-15 (×18): 1200 mg via ORAL
  Filled 2015-08-09 (×25): qty 3

## 2015-08-09 MED ORDER — OXYMETAZOLINE HCL 0.05 % NA SOLN
1.0000 | Freq: Two times a day (BID) | NASAL | Status: DC | PRN
  Administered 2015-08-10 – 2015-08-15 (×10): 1 via NASAL
  Filled 2015-08-09: qty 15

## 2015-08-09 MED ORDER — CITALOPRAM HYDROBROMIDE 20 MG PO TABS
20.0000 mg | ORAL_TABLET | Freq: Every day | ORAL | Status: DC
  Administered 2015-08-10 – 2015-08-15 (×6): 20 mg via ORAL
  Filled 2015-08-09 (×9): qty 1

## 2015-08-09 MED ORDER — ONDANSETRON HCL 4 MG PO TABS: 4.0000 mg | ORAL_TABLET | Freq: Three times a day (TID) | ORAL | Status: DC | PRN

## 2015-08-09 MED ORDER — ALUM & MAG HYDROXIDE-SIMETH 200-200-20 MG/5ML PO SUSP: 30.0000 mL | ORAL | Status: DC | PRN

## 2015-08-09 MED ORDER — TRAZODONE HCL 50 MG PO TABS
50.0000 mg | ORAL_TABLET | Freq: Every evening | ORAL | Status: DC | PRN
  Administered 2015-08-09: 50 mg via ORAL
  Filled 2015-08-09: qty 1

## 2015-08-09 MED ORDER — LISINOPRIL 20 MG PO TABS
20.0000 mg | ORAL_TABLET | Freq: Every day | ORAL | Status: DC
  Administered 2015-08-10 – 2015-08-15 (×6): 20 mg via ORAL
  Filled 2015-08-09 (×9): qty 1

## 2015-08-09 MED ORDER — AMLODIPINE BESYLATE 10 MG PO TABS
10.0000 mg | ORAL_TABLET | Freq: Every day | ORAL | Status: DC
  Administered 2015-08-10 – 2015-08-15 (×6): 10 mg via ORAL
  Filled 2015-08-09 (×3): qty 1
  Filled 2015-08-09: qty 2
  Filled 2015-08-09 (×5): qty 1

## 2015-08-09 MED ORDER — ACETAMINOPHEN 325 MG PO TABS: 650.0000 mg | ORAL_TABLET | Freq: Four times a day (QID) | ORAL | Status: DC | PRN

## 2015-08-09 MED ORDER — INSULIN ASPART 100 UNIT/ML ~~LOC~~ SOLN
0.0000 [IU] | Freq: Three times a day (TID) | SUBCUTANEOUS | Status: DC
  Administered 2015-08-09: 3 [IU] via SUBCUTANEOUS
  Administered 2015-08-10: 5 [IU] via SUBCUTANEOUS
  Administered 2015-08-10: 3 [IU] via SUBCUTANEOUS
  Administered 2015-08-10: 5 [IU] via SUBCUTANEOUS
  Administered 2015-08-11: 8 [IU] via SUBCUTANEOUS
  Administered 2015-08-11: 3 [IU] via SUBCUTANEOUS
  Administered 2015-08-11: 11 [IU] via SUBCUTANEOUS
  Administered 2015-08-12: 5 [IU] via SUBCUTANEOUS
  Administered 2015-08-12 (×2): 8 [IU] via SUBCUTANEOUS
  Administered 2015-08-13: 11 [IU] via SUBCUTANEOUS
  Administered 2015-08-13: 8 [IU] via SUBCUTANEOUS
  Administered 2015-08-13 – 2015-08-14 (×2): 15 [IU] via SUBCUTANEOUS
  Administered 2015-08-14: 5 [IU] via SUBCUTANEOUS
  Administered 2015-08-14: 15 [IU] via SUBCUTANEOUS
  Administered 2015-08-15: 11 [IU] via SUBCUTANEOUS
  Administered 2015-08-15: 8 [IU] via SUBCUTANEOUS

## 2015-08-09 MED ORDER — ARIPIPRAZOLE 15 MG PO TABS
7.5000 mg | ORAL_TABLET | Freq: Two times a day (BID) | ORAL | Status: DC
  Administered 2015-08-09 – 2015-08-15 (×12): 7.5 mg via ORAL
  Filled 2015-08-09 (×18): qty 1

## 2015-08-09 MED ORDER — ALBUTEROL SULFATE HFA 108 (90 BASE) MCG/ACT IN AERS: 2.0000 | INHALATION_SPRAY | Freq: Four times a day (QID) | RESPIRATORY_TRACT | Status: DC | PRN

## 2015-08-09 MED ORDER — PRAVASTATIN SODIUM 10 MG PO TABS
10.0000 mg | ORAL_TABLET | Freq: Every day | ORAL | Status: DC
  Administered 2015-08-09 – 2015-08-14 (×6): 10 mg via ORAL
  Filled 2015-08-09 (×8): qty 1

## 2015-08-09 MED ORDER — INSULIN GLARGINE 100 UNIT/ML ~~LOC~~ SOLN
60.0000 [IU] | Freq: Every day | SUBCUTANEOUS | Status: DC
  Administered 2015-08-09 – 2015-08-14 (×6): 60 [IU] via SUBCUTANEOUS

## 2015-08-09 NOTE — Progress Notes (Signed)
Psychoeducational Group Note  Date:  08/09/2015 Time:  2100 Group Topic/Focus:  wrap up group  Participation Level: Did Not Attend  Participation Quality:  Not Applicable  Affect:  Not Applicable  Cognitive:  Not Applicable  Insight:  Not Applicable  Engagement in Group: Not Applicable  Additional Comments:  Pt was notified that group was beginning but remained in bed.   [name], [name] 08/09/2015, 10:27 PM

## 2015-08-09 NOTE — Tx Team (Addendum)
Initial Interdisciplinary Treatment Plan   PATIENT STRESSORS: Financial difficulties homelessness   PATIENT STRENGTHS: Ability for insight Psychologist, counsellingCommunication skills Financial means   PROBLEM LIST: Problem List/Patient Goals Date to be addressed Date deferred Reason deferred Estimated date of resolution  "I want to kill myself" 08/09/2015     "I was using cocaine"      Suicidal Ideation      Substance                                     DISCHARGE CRITERIA:  Adequate post-discharge living arrangements Verbal commitment to aftercare and medication compliance  PRELIMINARY DISCHARGE PLAN: Attend 12-step recovery group Placement in alternative living arrangements  PATIENT/FAMIILY INVOLVEMENT: This treatment plan has been presented to and reviewed with the patient, Maudie FlakesMelvin K Bourgoin. The patient and family have been given the opportunity to ask questions and make suggestions.  Jerrye BushyLaRonica R Waller 08/09/2015, 6:53 PM

## 2015-08-09 NOTE — ED Notes (Signed)
Pt discharged per MD order. [facility] at facility [facility]. Pt signed for personal belongings and personal belongings given to [facility] transport service. Pt signed e-signature and ambulatory out of facility.

## 2015-08-09 NOTE — Progress Notes (Signed)
Patient ID: Billy FlakesMelvin K Wescoat, male   DOB: 07/17/1960, 55 y.o.   MRN: [id number] Patient was admitted to the unit due to increased suicidal thought.  Patient acknowledged feelings of depression and stated that he wanted to walk in front of traffic or cut his throat with a knife.  Patient is able to contract for safety while in the hospital.  Patient acknowledge understanding of patient treatment agreement.  Belonging search and skin assessment complete. Patient compliant with unit expectations.

## 2015-08-09 NOTE — BH Assessment (Signed)
Received a call from Berneice Heinrichina [facility]. [facility]. [facility] by Dr. Jannifer FranklinAkintayo and Julieanne CottonJosephine, NP. Nursing report # is [phone]. Patient is voluntary. Patient has signed support paperwork. Nursing staff updated regarding patient's disposition. Nursing staff will need to complete [facility]'s [facility]. [facility]

## 2015-08-09 NOTE — Progress Notes (Signed)
Patient ID: Billy [name], male   DOB: 06/12/1960, 55 y.o.   MRN: [id number] PER STATE REGULATIONS 482.30  THIS CHART WAS REVIEWED FOR MEDICAL NECESSITY WITH RESPECT TO THE PATIENT'S ADMISSION/DURATION OF STAY.  NEXT REVIEW DATE: 08/13/15  Loura HaltBARBARA Palestine Mosco, RN, BSN CASE MANAGER

## 2015-08-10 ENCOUNTER — Encounter (HOSPITAL_COMMUNITY): Payer: Self-pay | Admitting: Psychiatry

## 2015-08-10 DIAGNOSIS — F1410 Cocaine abuse, uncomplicated: Secondary | ICD-10-CM

## 2015-08-10 DIAGNOSIS — F314 Bipolar disorder, current episode depressed, severe, without psychotic features: Secondary | ICD-10-CM

## 2015-08-10 DIAGNOSIS — R45851 Suicidal ideations: Secondary | ICD-10-CM

## 2015-08-10 LAB — GLUCOSE, CAPILLARY
GLUCOSE-CAPILLARY: 181 mg/dL — AB (ref 65–99)
GLUCOSE-CAPILLARY: 297 mg/dL — AB (ref 65–99)
Glucose-Capillary: 198 mg/dL — ABNORMAL HIGH (ref 65–99)
Glucose-Capillary: 219 mg/dL — ABNORMAL HIGH (ref 65–99)
Glucose-Capillary: 297 mg/dL — ABNORMAL HIGH (ref 65–99)

## 2015-08-10 MED ORDER — TRAZODONE HCL 150 MG PO TABS
300.0000 mg | ORAL_TABLET | Freq: Every day | ORAL | Status: DC
  Administered 2015-08-10 – 2015-08-14 (×5): 300 mg via ORAL
  Filled 2015-08-10 (×7): qty 2

## 2015-08-10 MED ORDER — TIZANIDINE HCL 4 MG PO TABS
4.0000 mg | ORAL_TABLET | Freq: Three times a day (TID) | ORAL | Status: DC
  Administered 2015-08-10 – 2015-08-15 (×15): 4 mg via ORAL
  Filled 2015-08-10 (×20): qty 1

## 2015-08-10 NOTE — Tx Team (Signed)
  Interdisciplinary Treatment Plan Update (Adult)  Date:  08/10/2015  Time Reviewed:  8:59 AM   Progress in Treatment: Attending groups: No. New to unit. Continuing to assess.  Participating in groups:  No. Taking medication as prescribed:  Yes. Tolerating medication:  Yes. Family/Significant othe contact made:  SPE required for this pt.  Patient understands diagnosis:  Yes. and As evidenced by:  seeking treatment for increased depression, SI, and cocaine abuse. Discussing patient identified problems/goals with staff:  Yes. Medical problems stabilized or resolved:  Yes. Denies suicidal/homicidal ideation: Yes. Issues/concerns per patient self-inventory:  Other:  Discharge Plan or Barriers: CSW assessing for appropriate referrals. Pt last admission 09/2014. Pt did not attend morning discharge planning group.   Reason for Continuation of Hospitalization: Depression Medication stabilization Withdrawal symptoms  Comments:  Patient was admitted to the unit due to increased suicidal thought. Patient acknowledged feelings of depression and stated that he wanted to walk in front of traffic or cut his throat with a knife. Pt UDS positive for cocaine only.   Estimated length of stay:  3-5 days   New goal(s): to develop effective aftercare plan.   Additional Comments:  Patient and CSW reviewed pt's identified goals and treatment plan. Patient verbalized understanding and agreed to treatment plan. CSW reviewed [facility] "Discharge Process and Patient Involvement" Form. Pt verbalized understanding of information provided and signed form.    Review of initial/current patient goals per problem list:  1. Goal(s): Patient will participate in aftercare plan  Met: No.   Target date: at discharge  As evidenced by: Patient will participate within aftercare plan AEB aftercare provider and housing plan at discharge being identified.  3/10 :CSW assessing for appropriate referrals.   2. Goal (s):  Patient will exhibit decreased depressive symptoms and suicidal ideations.  Met: No.    Target date: at discharge  As evidenced by: Patient will utilize self rating of depression at 3 or below and demonstrate decreased signs of depression or be deemed stable for discharge by MD.  3/10: Pt rates depression as high. Denies SI/HI/AVH today.   3. Goal(s): Patient will demonstrate decreased signs of withdrawal due to substance abuse  Met:No.   Target date:at discharge   As evidenced by: Patient will produce a CIWA/COWS score of 0, have stable vitals signs, and no symptoms of withdrawal.  3/10: Pt reports mild withdrawals with CIWA not taken and high pulse/BP.   Attendees: Patient:   08/10/2015 8:59 AM   Family:   08/10/2015 8:59 AM   Physician:  Dr. Carlton Adam, MD 08/10/2015 8:59 AM   Nursing:   Charise Carwin RN 08/10/2015 8:59 AM   Clinical Social Worker: Maxie Better, LCSW 08/10/2015 8:59 AM   Clinical Social Worker: Erasmo Downer Drinkard LCSWA; Peri Maris LCSWA 08/10/2015 8:59 AM   Other:  Gerline Legacy Nurse Case Manager 08/10/2015 8:59 AM   Other:  Agustina Caroli NP  08/10/2015 8:59 AM   Other:   08/10/2015 8:59 AM   Other:  08/10/2015 8:59 AM   Other:  08/10/2015 8:59 AM   Other:  08/10/2015 8:59 AM    08/10/2015 8:59 AM    08/10/2015 8:59 AM    08/10/2015 8:59 AM    08/10/2015 8:59 AM    Scribe for Treatment Team:   Maxie Better, LCSW 08/10/2015 8:59 AM

## 2015-08-10 NOTE — [facility] Suicide Risk Assessment (Signed)
[facility]   Nursing information obtained from:    Demographic factors:    Current Mental Status:    Loss Factors:    Historical Factors:    Risk Reduction Factors:     Total Time spent with patient: 45 minutes Principal Problem: Bipolar disorder with current episode depressed (HCC) Diagnosis:   Patient Active Problem List   Diagnosis Date Noted  . Bipolar affective disorder, depressed, severe, with psychotic behavior (HCC) [F31.5] 09/14/2014    Priority: High  . Chronic pain syndrome [G89.4] 09/14/2014    Priority: High  . Cocaine abuse [F14.10] 09/13/2014    Priority: High  . Alcohol abuse [F10.10] 10/12/2012    Priority: High  . Opiate abuse, episodic [F11.10] 09/02/2011    Priority: High  . Bipolar affective disorder, depressed (HCC) [F31.30] 08/09/2015  . Bipolar disorder with current episode depressed (HCC) [F31.30] 08/09/2015  . Polysubstance abuse [F19.10]   . Type 2 diabetes mellitus with hyperglycemia (HCC) [E11.65] 09/19/2014  . Right leg DVT (HCC) [I82.401] 09/19/2014  . Essential hypertension [I10] 09/19/2014  . Alcohol dependence with uncomplicated withdrawal (HCC) [F10.230] 09/13/2014  . Homeless [Z59.0] 09/02/2011  . Diabetes mellitus [250] 08/30/2011  . WPW (Wolff-Parkinson-White syndrome) [I45.6] 08/30/2011   Subjective Data: see admission H and P  Continued Clinical Symptoms:  Alcohol Use Disorder Identification Test Final Score (AUDIT): 0 The "Alcohol Use Disorders Identification Test", Guidelines for Use in Primary Care, Second Edition.  World Science writerHealth Organization [facility](WHO). Score between 0-7:  no or low risk or alcohol related problems. Score between 8-15:  moderate risk of alcohol related problems. Score between 16-19:  high risk of alcohol related problems. Score 20 or above:  warrants further diagnostic evaluation for alcohol dependence and treatment.   CLINICAL FACTORS:   Bipolar Disorder:   Depressive  phase Alcohol/Substance Abuse/Dependencies  Psychiatric Specialty Exam: ROS  Blood pressure 143/96, pulse 108, temperature 97.9 F (36.6 C), temperature source Oral, resp. rate 16, height 6' (1.829 m), weight 94.348 kg (208 lb), SpO2 98 %.Body mass index is 28.2 kg/(m^2).  COGNITIVE FEATURES THAT CONTRIBUTE TO RISK:  Closed-mindedness, Polarized thinking and Thought constriction (tunnel vision)    SUICIDE RISK:   Moderate:  Frequent suicidal ideation with limited intensity, and duration, some specificity in terms of plans, no associated intent, good self-control, limited dysphoria/symptomatology, some risk factors present, and identifiable protective factors, including available and accessible social support.  PLAN OF CARE: See admission H and P  I certify that inpatient services furnished can reasonably be expected to improve the patient's condition.   Rachael FeeLUGO,Tayli Buch A, MD 08/10/2015, 6:43 PM

## 2015-08-10 NOTE — Progress Notes (Signed)
Recreation Therapy Notes  Date: 03.10.2017 Time: 9:30am Location: [address] Hall Group Room   Group Topic: Stress Management  Goal Area(s) Addresses:  Patient will actively participate in stress management techniques presented during session.   Behavioral Response: Did not attend.    Maryela Tapper L Tyshae Stair, LRT/CTRS        Pricila Bridge L 08/10/2015 10:23 AM 

## 2015-08-10 NOTE — Progress Notes (Signed)
D- Patient has been in his room for the majority of the shift.  Patient did not engage in  Unit activities and remains withdrawn and depressed.  Patient is still having suicidal thoughts but states "I am trying not to thinks so negatively."  Patient has been compliant with medications.   A- Assess patient for safety, offer medications as prescribed, engage patient in 1. 1 staff talks, offer   R-  Patient able to contract for safety

## 2015-08-10 NOTE — H&P (Signed)
Psychiatric Admission Assessment Adult  Patient Identification: Billy [name] MRN:  [id number] Date of Evaluation:  08/10/2015 Chief Complaint:  bipolar disorder Principal Diagnosis: <principal problem not specified> Diagnosis:   Patient Active Problem List   Diagnosis Date Noted  . Bipolar affective disorder, depressed, severe, with psychotic behavior (HCC) [F31.5] 09/14/2014    Priority: High  . Chronic pain syndrome [G89.4] 09/14/2014    Priority: High  . Cocaine abuse [F14.10] 09/13/2014    Priority: High  . Alcohol abuse [F10.10] 10/12/2012    Priority: High  . Opiate abuse, episodic [F11.10] 09/02/2011    Priority: High  . Bipolar affective disorder, depressed (HCC) [F31.30] 08/09/2015  . Bipolar disorder with current episode depressed (HCC) [F31.30] 08/09/2015  . Polysubstance abuse [F19.10]   . Type 2 diabetes mellitus with hyperglycemia (HCC) [E11.65] 09/19/2014  . Right leg DVT (HCC) [I82.401] 09/19/2014  . Essential hypertension [I10] 09/19/2014  . Alcohol dependence with uncomplicated withdrawal (HCC) [F10.230] 09/13/2014  . Homeless [Z59.0] 09/02/2011  . Diabetes mellitus [250] 08/30/2011  . WPW (Wolff-Parkinson-White syndrome) [I45.6] 08/30/2011   History of Present Illness:: 55 Y/O male who states that after he left here on September 21 2014,  he went to [facility] for 2 weeks then [facility] 6 months He got out May 05 2015. Then a rooming house what states was full of"crack heads and bed bugs". Left there last Monday. He relapsed on cocaine. Admits to feeling increasingly more depressed, despondent suicidal. States he feels hopeless helpless. States if this is what the way his life was going to be he would rather kill himslef  The initial assessment was as follows: Patient is a 55 year old male with suicidal ideation and a plan to cut his throat with a razor or jumping into traffic. Patient reports that he had access to a razor; however, the hospital took the  razor from him. Patient reports increased depression due to living in a boarding room that had bed bugs. Patient reports previous suicide attempts in the past. Patient reports that he does not have family support. Patient reports that he would rather be dead than to continue to live like this. Patient reports receiving medication management and outpatient therapy from [facility] [place]. Patient reports that he has not taken his psychiatric medication in three weeks. Patient reports occasional cocaine abuse. Patient reports his last use was Sunday. Patient UDS is positive for cocaine and his BAL is <5. Patient denies seizures and withdrawal symptoms.  Patient denies HI and psychosis. Patient denies physical, sexual or emotional abuse.    Associated Signs/Symptoms: Depression Symptoms:  depressed mood, anhedonia, insomnia, fatigue, feelings of worthlessness/guilt, difficulty concentrating, hopelessness, suicidal thoughts with specific plan, anxiety, panic attacks, loss of energy/fatigue, disturbed sleep, (Hypo) Manic Symptoms:  Impulsivity, Irritable Mood, Labiality of Mood, Anxiety Symptoms:  Excessive Worry, Panic Symptoms, Psychotic Symptoms:  denies PTSD Symptoms: Negative Total Time spent with patient: 45 minutes  Past Psychiatric History:   Is the patient at risk to self? Yes.    Has the patient been a risk to self in the past 6 months? Yes.    Has the patient been a risk to self within the distant past? Yes.    Is the patient a risk to others? No.  Has the patient been a risk to others in the past 6 months? No.  Has the patient been a risk to others within the distant past? No.   Prior Inpatient Therapy:  [facility]  [facility] Out[facility] Celexa Trazodone Ability  Alcohol Screening: 1. How often do you have a drink containing alcohol?: Never 9. Have you or someone else been injured as a result of your drinking?: No 10. Has  a relative or friend or a doctor or another health worker been concerned about your drinking or suggested you cut down?: No Alcohol Use Disorder Identification Test Final Score (AUDIT): 0 Substance Abuse History in the last 12 months:  Yes.   Consequences of Substance Abuse: Negative Previous Psychotropic Medications: Yes  Psychological Evaluations: No  Past Medical History:  Past Medical History  Diagnosis Date  . Diabetes mellitus   . Hepatitis C carrier   . WPW (Wolff-Parkinson-White syndrome)   . Depression   . Chronic pain   . Suicide attempt (HCC)   . Diabetes mellitus without complication (HCC)   . Hypertension   . Bipolar 1 disorder (HCC)   . HCV (hepatitis C virus)   . Homeless     Past Surgical History  Procedure Laterality Date  . Rhinoplasty    . Chest surgery    . Back surgery    . Tonsillectomy     Family History: History reviewed. No pertinent family history. Family Psychiatric  History: Denies family history Tobacco Screening: [phone])::1)@ Social History:  History  Alcohol Use  . Yes    Comment: last drink 24 days     History  Drug Use  . Yes  . 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) Additional Social History:                           Allergies:  No Known Allergies Lab Results:  Results for orders placed or performed during the hospital encounter of 08/09/15 (from the past 48 hour(s))  Glucose, capillary     Status: Abnormal   Collection Time: 08/09/15  5:07 PM  Result Value Ref Range   Glucose-Capillary 198 (H) 65 - 99 mg/dL   Comment 1 Notify RN    Comment 2 Document in Chart   Glucose, capillary     Status: Abnormal   Collection Time: 08/09/15  9:22 PM  Result Value Ref Range   Glucose-Capillary 194 (H) 65 - 99 mg/dL   Comment 1 Notify RN    Comment 2 Document in Chart   Glucose,  capillary     Status: Abnormal   Collection Time: 08/10/15  6:08 AM  Result Value Ref Range   Glucose-Capillary 219 (H) 65 - 99 mg/dL   Comment 1 Notify RN    Comment 2 Document in Chart     Blood Alcohol level:  Lab Results  Component Value Date   ETH <5 08/08/2015   ETH <5 09/12/2014    Metabolic Disorder Labs:  Lab Results  Component Value Date   HGBA1C 6.7* 09/14/2014   MPG 146 09/14/2014   MPG 126* 10/12/2012   No results found for: PROLACTIN No results found for: CHOL, TRIG, HDL, CHOLHDL, VLDL, LDLCALC  Current Medications: Current Facility-Administered Medications  Medication Dose Route Frequency Provider Last Rate Last Dose  . acetaminophen (TYLENOL) tablet 650 mg  650 mg Oral Q4H PRN Earney Navy, NP      . albuterol (PROVENTIL HFA;VENTOLIN HFA) 108 (90 Base) MCG/ACT inhaler 2 puff  2 puff Inhalation Q6H  PRN Earney Navy, NP      . alum & mag hydroxide-simeth (MAALOX/MYLANTA) 200-200-20 MG/5ML suspension 30 mL  30 mL Oral Q4H PRN Earney Navy, NP      . amLODipine (NORVASC) tablet 10 mg  10 mg Oral Daily Earney Navy, NP   10 mg at 08/10/15 0848  . ARIPiprazole (ABILIFY) tablet 7.5 mg  7.5 mg Oral BID Earney Navy, NP   7.5 mg at 08/10/15 0847  . citalopram (CELEXA) tablet 20 mg  20 mg Oral Daily Earney Navy, NP   20 mg at 08/10/15 0848  . gabapentin (NEURONTIN) capsule 1,200 mg  1,200 mg Oral TID Earney Navy, NP   1,200 mg at 08/10/15 0848  . insulin aspart (novoLOG) injection 0-15 Units  0-15 Units Subcutaneous TID WC Earney Navy, NP   5 Units at 08/10/15 0636  . insulin glargine (LANTUS) injection 60 Units  60 Units Subcutaneous QHS Earney Navy, NP   60 Units at 08/09/15 2225  . lisinopril (PRINIVIL,ZESTRIL) tablet 20 mg  20 mg Oral Daily Earney Navy, NP   20 mg at 08/10/15 0848  . magnesium hydroxide (MILK OF MAGNESIA) suspension 30 mL  30 mL Oral Daily PRN Earney Navy, NP      . ondansetron  (ZOFRAN) tablet 4 mg  4 mg Oral Q8H PRN Earney Navy, NP      . oxymetazoline (AFRIN) 0.05 % nasal spray 1 spray  1 spray Each Nare BID PRN Earney Navy, NP      . pantoprazole (PROTONIX) EC tablet 40 mg  40 mg Oral Daily Earney Navy, NP   40 mg at 08/10/15 0848  . pravastatin (PRAVACHOL) tablet 10 mg  10 mg Oral QHS Earney Navy, NP   10 mg at 08/09/15 2219  . tiotropium (SPIRIVA) inhalation capsule 18 mcg  18 mcg Inhalation Daily Earney Navy, NP   18 mcg at 08/10/15 1058  . traZODone (DESYREL) tablet 50 mg  50 mg Oral QHS PRN Earney Navy, NP   50 mg at 08/09/15 2155   PTA Medications: Prescriptions prior to admission  Medication Sig Dispense Refill Last Dose  . albuterol (PROAIR HFA) 108 (90 BASE) MCG/ACT inhaler Inhale 2 puffs into the lungs every 6 (six) hours as needed for shortness of breath. 1 Inhaler 0 08/07/2015 at Unknown time  . amLODipine (NORVASC) 10 MG tablet Take 1 tablet (10 mg total) by mouth daily. 30 tablet 0 unknown  . ARIPiprazole (ABILIFY) 15 MG tablet Take 0.5 tablets (7.5 mg total) by mouth 2 (two) times daily. For hallucination 60 tablet 0 unknown  . citalopram (CELEXA) 20 MG tablet Take 1 tablet (20 mg total) by mouth daily. For depression 30 tablet 0 unknown  . docusate sodium (COLACE) 100 MG capsule Take 1 capsule (100 mg total) by mouth 2 (two) times daily. For constipation 60 capsule 0 unknown  . gabapentin (NEURONTIN) 400 MG capsule Take 3 capsules by mouth 3 (three) times daily.  5 unknown  . insulin aspart (NOVOLOG) 100 UNIT/ML injection Inject 3-15 Units into the skin 3 (three) times daily before meals.   2 weeks  . insulin glargine (LANTUS) 100 UNIT/ML injection Inject 60 Units into the skin at bedtime.    08/07/2015 at Unknown time  . lisinopril (PRINIVIL,ZESTRIL) 20 MG tablet Take 1 tablet (20 mg total) by mouth daily. For high blood pressure 30 tablet 0 Past Week at Unknown time  .  omeprazole (PRILOSEC) 20 MG capsule Take 40  mg by mouth daily.   08/07/2015 at Unknown time  . oxymetazoline (AFRIN) 0.05 % nasal spray Place 1 spray into both nostrils 2 (two) times daily as needed for congestion. 30 mL 0 08/07/2015 at Unknown time  . pravastatin (PRAVACHOL) 10 MG tablet Take 10 mg by mouth at bedtime.  5 unknown  . tiotropium (SPIRIVA) 18 MCG inhalation capsule Place 1 capsule (18 mcg total) into inhaler and inhale daily. For COPD 30 capsule 12 Past Week at Unknown time  . tiZANidine (ZANAFLEX) 2 MG tablet Take 4 mg by mouth 3 (three) times daily.  5 Past Week at Unknown time  . traZODone (DESYREL) 50 MG tablet Take 1 tablet (50 mg total) by mouth at bedtime as needed for sleep. 30 tablet 0 Past Week at Unknown time    Musculoskeletal: Strength & Muscle Tone: within normal limits Gait & Station: normal Patient leans: normal  Psychiatric Specialty Exam: Physical Exam  Review of Systems  Constitutional: Positive for malaise/fatigue.  HENT: Negative.   Eyes: Negative.   Respiratory: Positive for cough and shortness of breath.   Cardiovascular: Negative.   Gastrointestinal: Positive for heartburn.  Genitourinary: Negative.   Musculoskeletal: Positive for back pain.  Skin: Negative.   Neurological: Positive for weakness.  Endo/Heme/Allergies: Negative.   Psychiatric/Behavioral: Positive for depression, suicidal ideas and substance abuse. The patient is nervous/anxious and has insomnia.     Blood pressure 143/96, pulse 108, temperature 97.9 F (36.6 C), temperature source Oral, resp. rate 16, height 6' (1.829 m), weight 94.348 kg (208 lb), SpO2 98 %.Body mass index is 28.2 kg/(m^2).  General Appearance: Fairly Groomed  Patent attorneyye Contact::  Fair  Speech:  Clear and Coherent  Volume:  Normal  Mood:  Anxious, Depressed and Dysphoric  Affect:  anxious worried   Thought Process:  Coherent and Goal Directed  Orientation:  Full (Time, Place, and Person)  Thought Content:  deals with symptoms events worries concerns   Suicidal Thoughts:  Yes.  without intent/plan  Homicidal Thoughts:  No  Memory:  Immediate;   Fair Recent;   Fair Remote;   Fair  Judgement:  Fair  Insight:  Present  Psychomotor Activity:  Restlessness  Concentration:  Fair  Recall:  FiservFair  Fund of Knowledge:Fair  Language: Fair  Akathisia:  No  Handed:  Right  AIMS (if indicated):     Assets:  Desire for Improvement  ADL's:  Intact  Cognition: WNL  Sleep:  Number of Hours: 6.25     Treatment Plan Summary: Daily contact with patient to assess and evaluate symptoms and progress in treatment and Medication management Supportive approach/coping skills Cocaine dependence; monitor for acute mood changes from coming off the cocaine Depression; continue the Celexa 20 mg daily Mood instability; Abilify 7.5 mg daily Work with CBT/mindfulness Explore residential treatment options   Observation Level/Precautions:  15 minute checks  Laboratory:  As per the ED  Psychotherapy:  Individual/group  Medications:  Will reassess for need for a detox protocol  Consultations:    Discharge Concerns:  Need for a residential treatment program  Estimated LOS: 3-5 days  Other:     I certify that inpatient services furnished can reasonably be expected to improve the patient's condition.    Rachael FeeLUGO,Zayvian Mcmurtry A, MD 3/10/20171:44 PM
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
## 2015-08-10 NOTE — [facility] Group Notes (Signed)
[facility]   08/10/2015 9:30 AM  Participation Quality:  Invited. DID NOT ATTEND. Pt chose to remain in bed.   Smart, Kenroy Timberman LCSW

## 2015-08-10 NOTE — Progress Notes (Signed)
D: Pt is isolative and withdrawn to room. Pt endorses severe depression with moderate anxiety; he state, "I don't a tab better than when I came in" Pt denies SI/HI, pain and AVH. Pt is blunt and irritable; states, "I don't think you guys know what you are doing sometimes. A: Medications offered as prescribed.  Support, encouragement, and safe environment provided.  15-minute safety checks continue. R: Pt was med compliant.  Pt did not attend group. Safety checks continue

## 2015-08-11 LAB — GLUCOSE, CAPILLARY
GLUCOSE-CAPILLARY: 190 mg/dL — AB (ref 65–99)
GLUCOSE-CAPILLARY: 301 mg/dL — AB (ref 65–99)
Glucose-Capillary: 345 mg/dL — ABNORMAL HIGH (ref 65–99)
Glucose-Capillary: 268 mg/dL — ABNORMAL HIGH (ref 65–99)

## 2015-08-11 MED ORDER — HYDROXYZINE HCL 50 MG PO TABS
50.0000 mg | ORAL_TABLET | Freq: Every evening | ORAL | Status: DC | PRN
  Administered 2015-08-11 – 2015-08-14 (×4): 50 mg via ORAL
  Filled 2015-08-11 (×12): qty 1

## 2015-08-11 NOTE — Progress Notes (Signed)
D: Patient observed in room sitting on side of bed. Patient voiced no concerns at this time. Patient states goal for today was to "make it through the day." Patient engaged in conversation very little with this Clinical research associatewriter. Patient was in room most of shift. A: Support and encouragement offered. Q 15 minute checks in progress and maintained.  R: Patient remains safe.

## 2015-08-11 NOTE — Progress Notes (Signed)
D-  Patient has been able in his room for the majority of the shift with the exception of groups and meals.  Patient has been compliant with medications.  Patient denies HI and AVH but endorses SI.  Patient is flat and stated that his depression remains the same.    A- Assess for safety, offer medications as prescribed, engage patient in 1:1 staff talks.   R-  Patient able to contract for safety.

## 2015-08-11 NOTE — Progress Notes (Signed)
[facility]  08/11/2015 11:52 AM Billy [name]  MRN:  [id number] Subjective:  Patient reports " I am having thoughts to jump in front of a bus.'"  Objective: Billy [name] is awake, alert and oriented X4 , found resting in bedroom. Patient reports suicidal ideations to "jump in front of a bus" states that theses thoughts are ongoing and chronic. Patient denies homicidal ideation. Denies auditory or visual hallucination and does not appear to be responding to internal stimuli. Patient reports " I have been to tired to attend group session"  Patient reports he is medication compliant without mediation side effects. States his depression 8/10.  Reports good appetite.  States that he is not resting well throughout the night.   Support, encouragement and reassurance was provided.   Principal Problem: Bipolar disorder with current episode depressed (HCC) Diagnosis:   Patient Active Problem List   Diagnosis Date Noted  . Bipolar affective disorder, depressed (HCC) [F31.30] 08/09/2015  . Bipolar disorder with current episode depressed (HCC) [F31.30] 08/09/2015  . Polysubstance abuse [F19.10]   . Type 2 diabetes mellitus with hyperglycemia (HCC) [E11.65] 09/19/2014  . Right leg DVT (HCC) [I82.401] 09/19/2014  . Essential hypertension [I10] 09/19/2014  . Bipolar affective disorder, depressed, severe, with psychotic behavior (HCC) [F31.5] 09/14/2014  . Chronic pain syndrome [G89.4] 09/14/2014  . Alcohol dependence with uncomplicated withdrawal (HCC) [F10.230] 09/13/2014  . Cocaine abuse [F14.10] 09/13/2014  . Alcohol abuse [F10.10] 10/12/2012  . Homeless [Z59.0] 09/02/2011  . Opiate abuse, episodic [F11.10] 09/02/2011  . Diabetes mellitus [250] 08/30/2011  . WPW (Wolff-Parkinson-White syndrome) [I45.6] 08/30/2011   Total Time spent with patient: 30 minutes  Past Psychiatric History: See Above  Past Medical History:  Past Medical History  Diagnosis Date  . Diabetes mellitus   .  Hepatitis C carrier   . WPW (Wolff-Parkinson-White syndrome)   . Depression   . Chronic pain   . Suicide attempt (HCC)   . Diabetes mellitus without complication (HCC)   . Hypertension   . Bipolar 1 disorder (HCC)   . HCV (hepatitis C virus)   . Homeless     Past Surgical History  Procedure Laterality Date  . Rhinoplasty    . Chest surgery    . Back surgery    . Tonsillectomy     Family History: History reviewed. No pertinent family history. Family Psychiatric  History: See above Social History:  History  Alcohol Use  . Yes    Comment: last drink 24 days     History  Drug Use  . Yes  . Special: Marijuana, "Crack" cocaine, Cocaine    Comment: last used 09/03/2014    Social History   Social History  . Marital Status: Divorced    Spouse Name: N/A  . Number of Children: N/A  . Years of Education: N/A   Social History Main Topics  . Smoking status: Current Every Day Smoker -- 0.50 packs/day for 8 years    Types: Cigarettes  . Smokeless tobacco: None  . Alcohol Use: Yes     Comment: last drink 24 days  . Drug Use: Yes    Special: Marijuana, "Crack" cocaine, Cocaine     Comment: last used 09/03/2014  . Sexual Activity: Not Asked   Other Topics Concern  . None   Social History Narrative   ** Merged History Encounter **       Additional Social History:  Sleep: Poor  Appetite:  Fair  Current Medications: Current Facility-Administered Medications  Medication Dose Route Frequency Provider Last Rate Last Dose  . acetaminophen (TYLENOL) tablet 650 mg  650 mg Oral Q4H PRN Earney Navy, NP      . albuterol (PROVENTIL HFA;VENTOLIN HFA) 108 (90 Base) MCG/ACT inhaler 2 puff  2 puff Inhalation Q6H PRN Earney Navy, NP      . alum & mag hydroxide-simeth (MAALOX/MYLANTA) 200-200-20 MG/5ML suspension 30 mL  30 mL Oral Q4H PRN Earney Navy, NP      . amLODipine (NORVASC) tablet 10 mg  10 mg Oral Daily Earney Navy, NP    10 mg at 08/11/15 0836  . ARIPiprazole (ABILIFY) tablet 7.5 mg  7.5 mg Oral BID Earney Navy, NP   7.5 mg at 08/11/15 0836  . citalopram (CELEXA) tablet 20 mg  20 mg Oral Daily Earney Navy, NP   20 mg at 08/11/15 0836  . gabapentin (NEURONTIN) capsule 1,200 mg  1,200 mg Oral TID Earney Navy, NP   1,200 mg at 08/11/15 0835  . insulin aspart (novoLOG) injection 0-15 Units  0-15 Units Subcutaneous TID WC Earney Navy, NP   3 Units at 08/11/15 0658  . insulin glargine (LANTUS) injection 60 Units  60 Units Subcutaneous QHS Earney Navy, NP   60 Units at 08/10/15 2225  . lisinopril (PRINIVIL,ZESTRIL) tablet 20 mg  20 mg Oral Daily Earney Navy, NP   20 mg at 08/11/15 0835  . magnesium hydroxide (MILK OF MAGNESIA) suspension 30 mL  30 mL Oral Daily PRN Earney Navy, NP      . ondansetron (ZOFRAN) tablet 4 mg  4 mg Oral Q8H PRN Earney Navy, NP      . oxymetazoline (AFRIN) 0.05 % nasal spray 1 spray  1 spray Each Nare BID PRN Earney Navy, NP   1 spray at 08/11/15 0620  . pantoprazole (PROTONIX) EC tablet 40 mg  40 mg Oral Daily Earney Navy, NP   40 mg at 08/11/15 0835  . pravastatin (PRAVACHOL) tablet 10 mg  10 mg Oral QHS Earney Navy, NP   10 mg at 08/10/15 2225  . tiotropium (SPIRIVA) inhalation capsule 18 mcg  18 mcg Inhalation Daily Earney Navy, NP   18 mcg at 08/11/15 0835  . tiZANidine (ZANAFLEX) tablet 4 mg  4 mg Oral TID Rachael Fee, MD   4 mg at 08/11/15 0835  . traZODone (DESYREL) tablet 300 mg  300 mg Oral QHS Rachael Fee, MD   300 mg at 08/10/15 2225    Lab Results:  Results for orders placed or performed during the hospital encounter of 08/09/15 (from the past 48 hour(s))  Glucose, capillary     Status: Abnormal   Collection Time: 08/09/15  5:07 PM  Result Value Ref Range   Glucose-Capillary 198 (H) 65 - 99 mg/dL   Comment 1 Notify RN    Comment 2 Document in Chart   Glucose, capillary     Status:  Abnormal   Collection Time: 08/09/15  9:22 PM  Result Value Ref Range   Glucose-Capillary 194 (H) 65 - 99 mg/dL   Comment 1 Notify RN    Comment 2 Document in Chart   Glucose, capillary     Status: Abnormal   Collection Time: 08/10/15  6:08 AM  Result Value Ref Range   Glucose-Capillary 219 (H) 65 - 99 mg/dL  Comment 1 Notify RN    Comment 2 Document in Chart   Glucose, capillary     Status: Abnormal   Collection Time: 08/10/15 11:50 AM  Result Value Ref Range   Glucose-Capillary 181 (H) 65 - 99 mg/dL   Comment 1 Notify RN   Glucose, capillary     Status: Abnormal   Collection Time: 08/10/15  5:05 PM  Result Value Ref Range   Glucose-Capillary 297 (H) 65 - 99 mg/dL   Comment 1 Notify RN   Glucose, capillary     Status: Abnormal   Collection Time: 08/10/15 10:16 PM  Result Value Ref Range   Glucose-Capillary 297 (H) 65 - 99 mg/dL  Glucose, capillary     Status: Abnormal   Collection Time: 08/11/15  6:12 AM  Result Value Ref Range   Glucose-Capillary 190 (H) 65 - 99 mg/dL  Glucose, capillary     Status: Abnormal   Collection Time: 08/11/15 11:46 AM  Result Value Ref Range   Glucose-Capillary 345 (H) 65 - 99 mg/dL    Blood Alcohol level:  Lab Results  Component Value Date   ETH <5 08/08/2015   ETH <5 09/12/2014    Physical Findings: AIMS: Facial and Oral Movements Muscles of Facial Expression: None, normal Lips and Perioral Area: None, normal Jaw: None, normal Tongue: None, normal,Extremity Movements Upper (arms, wrists, hands, fingers): None, normal Lower (legs, knees, ankles, toes): None, normal, Trunk Movements Neck, shoulders, hips: None, normal, Overall Severity Severity of abnormal movements (highest score from questions above): None, normal Incapacitation due to abnormal movements: None, normal Patient's awareness of abnormal movements (rate only patient's report): No Awareness, Dental Status Current problems with teeth and/or dentures?: Yes (patient has no  teeth and doesn't wear his dentures ) Does patient usually wear dentures?: No  CIWA:  CIWA-Ar Total: 0 COWS:     Musculoskeletal: Strength & Muscle Tone: within normal limits Gait & Station: normal Patient leans: N/A  Psychiatric Specialty Exam: Review of Systems  Psychiatric/Behavioral: Positive for depression, suicidal ideas and substance abuse. Negative for hallucinations. The patient is nervous/anxious and has insomnia.   All other systems reviewed and are negative.   Blood pressure 141/1, pulse 80, temperature 98.8 F (37.1 C), temperature source Oral, resp. rate 16, height 6' (1.829 m), weight 94.348 kg (208 lb), SpO2 98 %.Body mass index is 28.2 kg/(m^2).  General Appearance: Casual  Eye Contact::  Fair  Speech:  Clear and Coherent  Volume:  Normal  Mood:  Anxious and Depressed  Affect:  Congruent  Thought Process:  Logical  Orientation:  Full (Time, Place, and Person)  Thought Content:  Hallucinations: None  Suicidal Thoughts:  Yes.  with intent/plan Patient agrees to contract for safety  Homicidal Thoughts:  No  Memory:  Immediate;   Fair Recent;   Fair  Judgement:  Fair  Insight:  Fair  Psychomotor Activity:  Restlessness  Concentration:  Fair  Recall:  [employer] of Knowledge:Fair  Language: Fair  Akathisia:  No  Handed:  Right  AIMS (if indicated):     Assets:  Desire for Improvement Financial Resources/Insurance Housing Resilience Social Support  ADL's:  Intact  Cognition: WNL  Sleep:  Number of Hours: 6.25    I agree with current treatment plan on 08/11/2015, Patient seen face-to-face for psychiatric evaluation follow-up, chart reviewed and discussed with MD.Alanea Woolridge.  Reviewed the information documented and agree with the treatment plan.  Treatment Plan Summary: Daily contact with patient to assess and evaluate  symptoms and progress in treatment and Medication management   Supportive approach/coping skills Cocaine dependence; monitor for acute mood  changes from coming off the cocaine Depression; continue the Celexa 20 mg daily Mood instability; Abilify 7.5 mg daily Continue Trazodone 300 mg for insomnia and start vistaril 50 mg with 1x repeat dose   Work with CBT/mindfulness Explore residential treatment options   Oneta Rack, NP 08/11/2015, 11:52 AM  I reviewed chart and agreed with the findings and treatment Plan.  Kathryne Sharper, MD

## 2015-08-11 NOTE — [facility] Group Notes (Signed)
[facility] Group Notes: (Clinical Social Work)   08/11/2015      Type of Therapy:  Group Therapy   Participation Level:  Did Not Attend despite MHT prompting   Kemo Spruce Grossman-Orr, LCSW 08/11/2015, 12:46 PM     

## 2015-08-11 NOTE — Progress Notes (Signed)
D.  Pt in bed on approach, pleasant when awakened for nightly medication.  Pt denies complaints at this time. Pt has remained in bed this shift.  Pt denies SI/HI/hallucinaitons at this time.  A.  Support and encouragement offered, medications given as ordered.  R.  Pt remains safe on the unit, will continue to monitor.

## 2015-08-12 DIAGNOSIS — F319 Bipolar disorder, unspecified: Secondary | ICD-10-CM | POA: Insufficient documentation

## 2015-08-12 LAB — GLUCOSE, CAPILLARY
GLUCOSE-CAPILLARY: 277 mg/dL — AB (ref 65–99)
Glucose-Capillary: 261 mg/dL — ABNORMAL HIGH (ref 65–99)
Glucose-Capillary: 235 mg/dL — ABNORMAL HIGH (ref 65–99)
Glucose-Capillary: 292 mg/dL — ABNORMAL HIGH (ref 65–99)

## 2015-08-12 NOTE — Progress Notes (Signed)
[facility] MD Progress Note  08/12/2015 9:32 AM Billy [name]  MRN:  [id number] Subjective:  Patient reports " I am feeling crappy today they took one hour away from me'"  Objective: Billy [name] is awake, alert and oriented X4 found standing at nurses station. Patient continues to endorse suicidal ideations to "jump in front of a bus" states that theses thoughts are ongoing and chronic. Patient denies homicidal ideation. Denies auditory or visual hallucination and does not appear to be responding to internal stimuli. Patient reports " I need a few day for my medications to work."  Patient reports he is medication compliant without mediation side effects. States his depression 8/10.  Reports good appetite.  States that he is did have a better night rest.Support,encouragement and reassurance was provided.   Principal Problem: Bipolar disorder with current episode depressed (HCC) Diagnosis:   Patient Active Problem List   Diagnosis Date Noted  . Affective psychosis, bipolar (HCC) [F31.9]   . Bipolar affective disorder, depressed (HCC) [F31.30] 08/09/2015  . Bipolar disorder with current episode depressed (HCC) [F31.30] 08/09/2015  . Polysubstance abuse [F19.10]   . Type 2 diabetes mellitus with hyperglycemia (HCC) [E11.65] 09/19/2014  . Right leg DVT (HCC) [I82.401] 09/19/2014  . Essential hypertension [I10] 09/19/2014  . Bipolar affective disorder, depressed, severe, with psychotic behavior (HCC) [F31.5] 09/14/2014  . Chronic pain syndrome [G89.4] 09/14/2014  . Alcohol dependence with uncomplicated withdrawal (HCC) [F10.230] 09/13/2014  . Cocaine abuse [F14.10] 09/13/2014  . Alcohol abuse [F10.10] 10/12/2012  . Homeless [Z59.0] 09/02/2011  . Opiate abuse, episodic [F11.10] 09/02/2011  . Diabetes mellitus [250] 08/30/2011  . WPW (Wolff-Parkinson-White syndrome) [I45.6] 08/30/2011   Total Time spent with patient: 30 minutes  Past Psychiatric History: See Above  Past Medical History:  Past  Medical History  Diagnosis Date  . Diabetes mellitus   . Hepatitis C carrier   . WPW (Wolff-Parkinson-White syndrome)   . Depression   . Chronic pain   . Suicide attempt (HCC)   . Diabetes mellitus without complication (HCC)   . Hypertension   . Bipolar 1 disorder (HCC)   . HCV (hepatitis C virus)   . Homeless     Past Surgical History  Procedure Laterality Date  . Rhinoplasty    . Chest surgery    . Back surgery    . Tonsillectomy     Family History: History reviewed. No pertinent family history. Family Psychiatric  History: See above Social History:  History  Alcohol Use  . Yes    Comment: last drink 24 days     History  Drug Use  . Yes  . Special: Marijuana, "Crack" cocaine, Cocaine    Comment: last used 09/03/2014    Social History   Social History  . Marital Status: Divorced    Spouse Name: N/A  . Number of Children: N/A  . Years of Education: N/A   Social History Main Topics  . Smoking status: Current Every Day Smoker -- 0.50 packs/day for 8 years    Types: Cigarettes  . Smokeless tobacco: None  . Alcohol Use: Yes     Comment: last drink 24 days  . Drug Use: Yes    Special: Marijuana, "Crack" cocaine, Cocaine     Comment: last used 09/03/2014  . Sexual Activity: Not Asked   Other Topics Concern  . None   Social History Narrative   ** Merged History Encounter **       Additional Social History:  Sleep: Poor  Appetite:  Fair  Current Medications: Current Facility-Administered Medications  Medication Dose Route Frequency Provider Last Rate Last Dose  . acetaminophen (TYLENOL) tablet 650 mg  650 mg Oral Q4H PRN Earney NavyJosephine C Onuoha, NP      . albuterol (PROVENTIL HFA;VENTOLIN HFA) 108 (90 Base) MCG/ACT inhaler 2 puff  2 puff Inhalation Q6H PRN Earney NavyJosephine C Onuoha, NP      . alum & mag hydroxide-simeth (MAALOX/MYLANTA) 200-200-20 MG/5ML suspension 30 mL  30 mL Oral Q4H PRN Earney NavyJosephine C Onuoha, NP      . amLODipine  (NORVASC) tablet 10 mg  10 mg Oral Daily Earney NavyJosephine C Onuoha, NP   10 mg at 08/12/15 [id number]  . ARIPiprazole (ABILIFY) tablet 7.5 mg  7.5 mg Oral BID Earney NavyJosephine C Onuoha, NP   7.5 mg at 08/12/15 0923  . citalopram (CELEXA) tablet 20 mg  20 mg Oral Daily Earney NavyJosephine C Onuoha, NP   20 mg at 08/12/15 0923  . gabapentin (NEURONTIN) capsule 1,200 mg  1,200 mg Oral TID Earney NavyJosephine C Onuoha, NP   1,200 mg at 08/12/15 0924  . hydrOXYzine (ATARAX/VISTARIL) tablet 50 mg  50 mg Oral QHS,MR X 1 Oneta Rackanika N Lewis, NP   50 mg at 08/11/15 2127  . insulin aspart (novoLOG) injection 0-15 Units  0-15 Units Subcutaneous TID WC Earney NavyJosephine C Onuoha, NP   8 Units at 08/12/15 [phone] [id number]  . insulin glargine (LANTUS) injection 60 Units  60 Units Subcutaneous QHS Earney NavyJosephine C Onuoha, NP   60 Units at 08/11/15 2127  . lisinopril (PRINIVIL,ZESTRIL) tablet 20 mg  20 mg Oral Daily Earney NavyJosephine C Onuoha, NP   20 mg at 08/12/15 [id number]  . magnesium hydroxide (MILK OF MAGNESIA) suspension 30 mL  30 mL Oral Daily PRN Earney NavyJosephine C Onuoha, NP      . ondansetron (ZOFRAN) tablet 4 mg  4 mg Oral Q8H PRN Earney NavyJosephine C Onuoha, NP      . oxymetazoline (AFRIN) 0.05 % nasal spray 1 spray  1 spray Each Nare BID PRN Earney NavyJosephine C Onuoha, NP   1 spray at 08/12/15 0924  . pantoprazole (PROTONIX) EC tablet 40 mg  40 mg Oral Daily Earney NavyJosephine C Onuoha, NP   40 mg at 08/12/15 0921  . pravastatin (PRAVACHOL) tablet 10 mg  10 mg Oral QHS Earney NavyJosephine C Onuoha, NP   10 mg at 08/11/15 2127  . tiotropium (SPIRIVA) inhalation capsule 18 mcg  18 mcg Inhalation Daily Earney NavyJosephine C Onuoha, NP   18 mcg at 08/11/15 0835  . tiZANidine (ZANAFLEX) tablet 4 mg  4 mg Oral TID Rachael FeeIrving A Lugo, MD   4 mg at 08/12/15 0925  . traZODone (DESYREL) tablet 300 mg  300 mg Oral QHS Rachael FeeIrving A Lugo, MD   300 mg at 08/11/15 2127    Lab Results:  Results for orders placed or performed during the hospital encounter of 08/09/15 (from the past 48 hour(s))  Glucose, capillary     Status: Abnormal   Collection Time:  08/10/15 11:50 AM  Result Value Ref Range   Glucose-Capillary 181 (H) 65 - 99 mg/dL   Comment 1 Notify RN   Glucose, capillary     Status: Abnormal   Collection Time: 08/10/15  5:05 PM  Result Value Ref Range   Glucose-Capillary 297 (H) 65 - 99 mg/dL   Comment 1 Notify RN   Glucose, capillary     Status: Abnormal   Collection Time: 08/10/15 10:16 PM  Result Value Ref Range   Glucose-Capillary 297 (  H) 65 - 99 mg/dL  Glucose, capillary     Status: Abnormal   Collection Time: 08/11/15  6:12 AM  Result Value Ref Range   Glucose-Capillary 190 (H) 65 - 99 mg/dL  Glucose, capillary     Status: Abnormal   Collection Time: 08/11/15 11:46 AM  Result Value Ref Range   Glucose-Capillary 345 (H) 65 - 99 mg/dL  Glucose, capillary     Status: Abnormal   Collection Time: 08/11/15  5:11 PM  Result Value Ref Range   Glucose-Capillary 268 (H) 65 - 99 mg/dL  Glucose, capillary     Status: Abnormal   Collection Time: 08/11/15  9:15 PM  Result Value Ref Range   Glucose-Capillary 301 (H) 65 - 99 mg/dL   Comment 1 Notify RN    Comment 2 Document in Chart   Glucose, capillary     Status: Abnormal   Collection Time: 08/12/15  6:06 AM  Result Value Ref Range   Glucose-Capillary 261 (H) 65 - 99 mg/dL    Blood Alcohol level:  Lab Results  Component Value Date   ETH <5 08/08/2015   ETH <5 09/12/2014    Physical Findings: AIMS: Facial and Oral Movements Muscles of Facial Expression: None, normal Lips and Perioral Area: None, normal Jaw: None, normal Tongue: None, normal,Extremity Movements Upper (arms, wrists, hands, fingers): None, normal Lower (legs, knees, ankles, toes): None, normal, Trunk Movements Neck, shoulders, hips: None, normal, Overall Severity Severity of abnormal movements (highest score from questions above): None, normal Incapacitation due to abnormal movements: None, normal Patient's awareness of abnormal movements (rate only patient's report): No Awareness, Dental  Status Current problems with teeth and/or dentures?: Yes (patient has no teeth and doesn't wear his dentures ) Does patient usually wear dentures?: No  CIWA:  CIWA-Ar Total: 0 COWS:     Musculoskeletal: Strength & Muscle Tone: within normal limits Gait & Station: normal Patient leans: N/A  Psychiatric Specialty Exam: Review of Systems  Psychiatric/Behavioral: Positive for depression, suicidal ideas and substance abuse. Negative for hallucinations. The patient is nervous/anxious and has insomnia.   All other systems reviewed and are negative.   Blood pressure 141/1, pulse 80, temperature 98.8 F (37.1 C), temperature source Oral, resp. rate 16, height 6' (1.829 m), weight 94.348 kg (208 lb), SpO2 98 %.Body mass index is 28.2 kg/(m^2).  General Appearance: Casual  Eye Contact::  Fair  Speech:  Clear and Coherent  Volume:  Normal  Mood:  Anxious and Depressed  Affect:  Congruent  Thought Process:  Logical  Orientation:  Full (Time, Place, and Person)  Thought Content:  Hallucinations: None  Suicidal Thoughts:  Yes.  with intent/plan Patient agrees to contract for safety  Homicidal Thoughts:  No  Memory:  Immediate;   Fair Recent;   Fair  Judgement:  Fair  Insight:  Fair  Psychomotor Activity:  Restlessness- improving   Concentration:  Fair  Recall:  [employer] of Knowledge:Fair  Language: Fair  Akathisia:  No  Handed:  Right  AIMS (if indicated):     Assets:  Desire for Improvement Financial Resources/Insurance Housing Resilience Social Support  ADL's:  Intact  Cognition: WNL  Sleep:  Number of Hours: 4.75    I agree with current treatment plan on 08/12/2015, Patient seen face-to-face for psychiatric evaluation follow-up, chart reviewed and discussed with MD.Arfeen.  Reviewed the information documented and agree with the treatment plan.  Treatment Plan Summary: Daily contact with patient to assess and evaluate symptoms and progress  in treatment and Medication  management   Supportive approach/coping skills Cocaine dependence; monitor for acute mood changes from coming off the cocaine Depression; continue the Celexa 20 mg daily Mood instability; Abilify 7.5 mg daily Continue Trazodone 300 mg for insomnia and Continue vistaril 50 mg with 1x repeat dose   Work with CBT/mindfulness Explore residential treatment options   Oneta Rack, NP 08/12/2015, 9:32 AM   Patient seen face to face for psychiatric evaluation. Chart reviewed and finding discussed with Physician extender. Agreed with disposition and treatment plan.   Kathryne Sharper, MD

## 2015-08-12 NOTE — [facility] Group Notes (Signed)
[facility] Group Notes:  (Nursing/MHT/Case Management/Adjunct)  Date:  08/12/2015  Time:  10:29 AM  Type of Therapy:  Psychoeducational Skills  Participation Level:  Did Not Attend  Participation Quality:  N/A  Affect:  N/A  Cognitive:  N/A  Insight:  None  Engagement in Group:  None  Modes of Intervention:  Discussion and Education  Summary of Progress/Problems: Patient was invited but did not attend.   Kesa Birky E 08/12/2015, 10:29 AM 

## 2015-08-12 NOTE — Progress Notes (Addendum)
Patient did not attend the evening speaker AA meeting. Pt was notified that group was beginning but remained in bed.   

## 2015-08-12 NOTE — [facility] Group Notes (Signed)
[facility] Group Notes: (Clinical Social Work)   08/12/2015      Type of Therapy:  Group Therapy   Participation Level:  Did Not Attend despite MHT prompting   Ambrose MantleMareida Grossman-Orr, LCSW 08/12/2015, 12:39 PM

## 2015-08-12 NOTE — Progress Notes (Signed)
D.  Pt pleasant on approach denies complaints at this time.  Pt did not attend evening AA group, he stayed in bed.  Pt did come up for snacks and to have his blood sugar checked later.  Pt denies SI/HI/hallucinations at this time.  Interacting appropriately with his peers on the unit.  A.  Support and encouragement offered, medication given as ordered  R.  Pt remains safe on the unit, will continue to monitor.

## 2015-08-13 LAB — GLUCOSE, CAPILLARY
GLUCOSE-CAPILLARY: 436 mg/dL — AB (ref 65–99)
GLUCOSE-CAPILLARY: 278 mg/dL — AB (ref 65–99)
GLUCOSE-CAPILLARY: 307 mg/dL — AB (ref 65–99)
Glucose-Capillary: 282 mg/dL — ABNORMAL HIGH (ref 65–99)

## 2015-08-13 MED ORDER — METFORMIN HCL 500 MG PO TABS
500.0000 mg | ORAL_TABLET | Freq: Two times a day (BID) | ORAL | Status: DC
  Filled 2015-08-13 (×8): qty 1

## 2015-08-13 MED ORDER — INSULIN ASPART 100 UNIT/ML ~~LOC~~ SOLN
4.0000 [IU] | Freq: Three times a day (TID) | SUBCUTANEOUS | Status: DC
  Administered 2015-08-13 – 2015-08-14 (×4): 4 [IU] via SUBCUTANEOUS

## 2015-08-13 MED ORDER — NICOTINE 21 MG/24HR TD PT24
21.0000 mg | MEDICATED_PATCH | Freq: Every day | TRANSDERMAL | Status: DC
  Filled 2015-08-13 (×5): qty 1

## 2015-08-13 NOTE — Progress Notes (Addendum)
Patient ID: Billy [name], male   DOB: 04-17-1961, 55 y.o.   MRN: [id number]  Pt currently presents with a masked affect and cooperative behavior. Per self inventory, pt rates depression at a 8, hopelessness 8 and anxiety 5. Pt's daily goal is to "not to think about hurting myself" and they intend to do so by "not to think about it." Pt reports fair sleep, a good appetite, low energy and poor concentration.   Pt provided with medications per providers orders. Pt's labs and vitals were monitored throughout the day. Pt supported emotionally and encouraged to express concerns and questions. Pt educated on medications.  Pt's safety ensured with 15 minute and environmental checks. Pt currently denies SI/HI and A/V hallucinations. Pt does endorse some thoughts of hurting himself, pt has history of self harm. Pt verbally agrees to seek staff if SI/HI or A/VH occurs and to consult with staff before acting on any harmful thoughts. Pt remains safe today. Will continue POC.

## 2015-08-13 NOTE — Progress Notes (Signed)
[facility] MD Progress Note  08/13/2015 4:42 PM Billy [name]  MRN:  [id number] Subjective:  Billy [name] states he is still trying to get his life back together. Still endorses feeling down depressed, easily overwhelmed. States he is eating 3 meals and thinks this is why his blood sugar is up. Still not sure where he will go from here Principal Problem: Bipolar disorder with current episode depressed (HCC) Diagnosis:   Patient Active Problem List   Diagnosis Date Noted  . Bipolar affective disorder, depressed, severe, with psychotic behavior (HCC) [F31.5] 09/14/2014    Priority: High  . Chronic pain syndrome [G89.4] 09/14/2014    Priority: High  . Cocaine abuse [F14.10] 09/13/2014    Priority: High  . Alcohol abuse [F10.10] 10/12/2012    Priority: High  . Opiate abuse, episodic [F11.10] 09/02/2011    Priority: High  . Affective psychosis, bipolar (HCC) [F31.9]   . Bipolar affective disorder, depressed (HCC) [F31.30] 08/09/2015  . Bipolar disorder with current episode depressed (HCC) [F31.30] 08/09/2015  . Polysubstance abuse [F19.10]   . Type 2 diabetes mellitus with hyperglycemia (HCC) [E11.65] 09/19/2014  . Right leg DVT (HCC) [I82.401] 09/19/2014  . Essential hypertension [I10] 09/19/2014  . Alcohol dependence with uncomplicated withdrawal (HCC) [F10.230] 09/13/2014  . Homeless [Z59.0] 09/02/2011  . Diabetes mellitus [250] 08/30/2011  . WPW (Wolff-Parkinson-White syndrome) [I45.6] 08/30/2011   Total Time spent with patient: 15 minutes  Past Psychiatric History: see admission H and P  Past Medical History:  Past Medical History  Diagnosis Date  . Diabetes mellitus   . Hepatitis C carrier   . WPW (Wolff-Parkinson-White syndrome)   . Depression   . Chronic pain   . Suicide attempt (HCC)   . Diabetes mellitus without complication (HCC)   . Hypertension   . Bipolar 1 disorder (HCC)   . HCV (hepatitis C virus)   . Homeless     Past Surgical History  Procedure Laterality Date  .  Rhinoplasty    . Chest surgery    . Back surgery    . Tonsillectomy     Family History: History reviewed. No pertinent family history. Family Psychiatric  History: see admission H and P Social History:  History  Alcohol Use  . Yes    Comment: last drink 24 days     History  Drug Use  . Yes  . Special: Marijuana, "Crack" cocaine, Cocaine    Comment: last used 09/03/2014    Social History   Social History  . Marital Status: Divorced    Spouse Name: N/A  . Number of Children: N/A  . Years of Education: N/A   Social History Main Topics  . Smoking status: Current Every Day Smoker -- 0.50 packs/day for 8 years    Types: Cigarettes  . Smokeless tobacco: None  . Alcohol Use: Yes     Comment: last drink 24 days  . Drug Use: Yes    Special: Marijuana, "Crack" cocaine, Cocaine     Comment: last used 09/03/2014  . Sexual Activity: Not Asked   Other Topics Concern  . None   Social History Narrative   ** Merged History Encounter **       Additional Social History:                         Sleep: Fair  Appetite:  Fair  Current Medications: Current Facility-Administered Medications  Medication Dose Route Frequency Provider Last Rate Last Dose  . acetaminophen (  TYLENOL) tablet 650 mg  650 mg Oral Q4H PRN Earney Navy, NP      . albuterol (PROVENTIL HFA;VENTOLIN HFA) 108 (90 Base) MCG/ACT inhaler 2 puff  2 puff Inhalation Q6H PRN Earney Navy, NP      . alum & mag hydroxide-simeth (MAALOX/MYLANTA) 200-200-20 MG/5ML suspension 30 mL  30 mL Oral Q4H PRN Earney Navy, NP      . amLODipine (NORVASC) tablet 10 mg  10 mg Oral Daily Earney Navy, NP   10 mg at 08/13/15 0807  . ARIPiprazole (ABILIFY) tablet 7.5 mg  7.5 mg Oral BID Earney Navy, NP   7.5 mg at 08/13/15 0806  . citalopram (CELEXA) tablet 20 mg  20 mg Oral Daily Earney Navy, NP   20 mg at 08/13/15 0807  . gabapentin (NEURONTIN) capsule 1,200 mg  1,200 mg Oral TID Earney Navy, NP   1,200 mg at 08/13/15 1205  . hydrOXYzine (ATARAX/VISTARIL) tablet 50 mg  50 mg Oral QHS,MR X 1 Oneta Rack, NP   50 mg at 08/12/15 2128  . insulin aspart (novoLOG) injection 0-15 Units  0-15 Units Subcutaneous TID WC Earney Navy, NP   8 Units at 08/13/15 1210  . insulin aspart (novoLOG) injection 4 Units  4 Units Subcutaneous TID WC Sanjuana Kava, NP   4 Units at 08/13/15 1210  . insulin glargine (LANTUS) injection 60 Units  60 Units Subcutaneous QHS Earney Navy, NP   60 Units at 08/12/15 2110  . lisinopril (PRINIVIL,ZESTRIL) tablet 20 mg  20 mg Oral Daily Earney Navy, NP   20 mg at 08/13/15 0806  . magnesium hydroxide (MILK OF MAGNESIA) suspension 30 mL  30 mL Oral Daily PRN Earney Navy, NP   30 mL at 08/13/15 1443  . metFORMIN (GLUCOPHAGE) tablet 500 mg  500 mg Oral BID WC Sanjuana Kava, NP      . nicotine (NICODERM CQ - dosed in mg/24 hours) patch 21 mg  21 mg Transdermal Daily Rachael Fee, MD   21 mg at 08/13/15 0800  . ondansetron (ZOFRAN) tablet 4 mg  4 mg Oral Q8H PRN Earney Navy, NP      . oxymetazoline (AFRIN) 0.05 % nasal spray 1 spray  1 spray Each Nare BID PRN Earney Navy, NP   1 spray at 08/12/15 0924  . pantoprazole (PROTONIX) EC tablet 40 mg  40 mg Oral Daily Earney Navy, NP   40 mg at 08/13/15 0807  . pravastatin (PRAVACHOL) tablet 10 mg  10 mg Oral QHS Earney Navy, NP   10 mg at 08/12/15 2129  . tiotropium (SPIRIVA) inhalation capsule 18 mcg  18 mcg Inhalation Daily Earney Navy, NP   18 mcg at 08/13/15 0805  . tiZANidine (ZANAFLEX) tablet 4 mg  4 mg Oral TID Rachael Fee, MD   4 mg at 08/13/15 1205  . traZODone (DESYREL) tablet 300 mg  300 mg Oral QHS Rachael Fee, MD   300 mg at 08/12/15 2128    Lab Results:  Results for orders placed or performed during the hospital encounter of 08/09/15 (from the past 48 hour(s))  Glucose, capillary     Status: Abnormal   Collection Time: 08/11/15  5:11 PM   Result Value Ref Range   Glucose-Capillary 268 (H) 65 - 99 mg/dL  Glucose, capillary     Status: Abnormal   Collection Time: 08/11/15  9:15  PM  Result Value Ref Range   Glucose-Capillary 301 (H) 65 - 99 mg/dL   Comment 1 Notify RN    Comment 2 Document in Chart   Glucose, capillary     Status: Abnormal   Collection Time: 08/12/15  6:06 AM  Result Value Ref Range   Glucose-Capillary 261 (H) 65 - 99 mg/dL  Glucose, capillary     Status: Abnormal   Collection Time: 08/12/15 12:00 PM  Result Value Ref Range   Glucose-Capillary 235 (H) 65 - 99 mg/dL   Comment 1 Notify RN   Glucose, capillary     Status: Abnormal   Collection Time: 08/12/15  5:04 PM  Result Value Ref Range   Glucose-Capillary 292 (H) 65 - 99 mg/dL   Comment 1 Notify RN    Comment 2 Document in Chart   Glucose, capillary     Status: Abnormal   Collection Time: 08/12/15  9:07 PM  Result Value Ref Range   Glucose-Capillary 277 (H) 65 - 99 mg/dL   Comment 1 Notify RN    Comment 2 Document in Chart   Glucose, capillary     Status: Abnormal   Collection Time: 08/13/15  6:22 AM  Result Value Ref Range   Glucose-Capillary 436 (H) 65 - 99 mg/dL  Glucose, capillary     Status: Abnormal   Collection Time: 08/13/15 12:02 PM  Result Value Ref Range   Glucose-Capillary 278 (H) 65 - 99 mg/dL   Comment 1 Notify RN     Blood Alcohol level:  Lab Results  Component Value Date   ETH <5 08/08/2015   ETH <5 09/12/2014    Physical Findings: AIMS: Facial and Oral Movements Muscles of Facial Expression: None, normal Lips and Perioral Area: None, normal Jaw: None, normal Tongue: None, normal,Extremity Movements Upper (arms, wrists, hands, fingers): None, normal Lower (legs, knees, ankles, toes): None, normal, Trunk Movements Neck, shoulders, hips: None, normal, Overall Severity Severity of abnormal movements (highest score from questions above): None, normal Incapacitation due to abnormal movements: None, normal Patient's  awareness of abnormal movements (rate only patient's report): No Awareness, Dental Status Current problems with teeth and/or dentures?: Yes (Pt is edentulous) Does patient usually wear dentures?: No  CIWA:  CIWA-Ar Total: 2 COWS:     Musculoskeletal: Strength & Muscle Tone: within normal limits Gait & Station: normal Patient leans: normal  Psychiatric Specialty Exam: Review of Systems  Constitutional: Positive for malaise/fatigue.  HENT: Negative.   Eyes: Negative.   Respiratory: Negative.   Cardiovascular: Negative.   Gastrointestinal: Negative.   Genitourinary: Negative.   Musculoskeletal: Negative.   Skin: Negative.   Neurological: Positive for weakness.  Endo/Heme/Allergies: Negative.   Psychiatric/Behavioral: Positive for depression and substance abuse. The patient is nervous/anxious.     Blood pressure 121/67, pulse 94, temperature 98.6 F (37 C), temperature source Oral, resp. rate 16, height 6' (1.829 m), weight 94.348 kg (208 lb), SpO2 98 %.Body mass index is 28.2 kg/(m^2).  General Appearance: Disheveled  Eye Solicitor::  Fair  Speech:  Clear and Coherent  Volume:  Decreased  Mood:  Anxious and Depressed  Affect:  Restricted  Thought Process:  Coherent and Goal Directed  Orientation:  Full (Time, Place, and Person)  Thought Content:  symptoms events worries concerns  Suicidal Thoughts:  No  Homicidal Thoughts:  No  Memory:  Immediate;   Fair Recent;   Fair Remote;   Fair  Judgement:  Fair  Insight:  Shallow  Psychomotor Activity:  Restlessness  Concentration:  Fair  Recall:  FiservFair  Fund of Knowledge:Fair  Language: Fair  Akathisia:  No  Handed:  Right  AIMS (if indicated):     Assets:  Desire for Improvement  ADL's:  Intact  Cognition: WNL  Sleep:  Number of Hours: 6   Treatment Plan Summary: Daily contact with patient to assess and evaluate symptoms and progress in treatment and Medication management Supportive approach/coping skills Alcohol/cocaine  abuse; continue to work a relapse prevention plan Depression; continue the Celexa 20 mg daily Mood instability; continue the Abilify 7.5 mg  Insomnia: continue the Trazodone 300 mg HS Work with CBT/mindfulness Explore placement options Alyanah Elliott A, MD 08/13/2015, 4:42 PM

## 2015-08-13 NOTE — Care Management Utilization Note (Signed)
   Per State Regulation 482.30  This chart was reviewed for necessity with respect to the patient's Admission/ Duration of stay.  Next review date: 08/17/15  Nicolasa Duckingrystal Morrison RN, BSN

## 2015-08-13 NOTE — Progress Notes (Signed)
Recreation Therapy Notes  Date: 03.13.2017 Time: 9:30am  Location: [address] Group Room   Group Topic: Stress Management  Goal Area(s) Addresses:  Patient will actively participate in stress management techniques presented during session.   Behavioral Response: Did not attend.   Jaydah Stahle L Kasin Tonkinson, LRT/CTRS        Chasity Outten L 08/13/2015 1:41 PM 

## 2015-08-13 NOTE — Progress Notes (Signed)
Inpatient Diabetes Program Recommendations  AACE/ADA: New Consensus Statement on Inpatient Glycemic Control (2015)  Target Ranges:  Prepandial:   less than 140 mg/dL      Peak postprandial:   less than 180 mg/dL (1-2 hours)      Critically ill patients:  140 - 180 mg/dL   Review of Glycemic Control  Diabetes history: DM2 Outpatient Diabetes medications: Lantus 60 units QHS, Novolog 3-15 units tidwc (per RN, pt not taking Novolog) Current orders for Inpatient glycemic control: Lantus 60 units QHS, Novolog moderate tidwc and hs + 3 units tidwc. Results for Billy [name], Billy [name] (MRN [id number]) as of 08/13/2015 14:31  Ref. Range 08/12/2015 12:00 08/12/2015 17:04 08/12/2015 21:07 08/13/2015 06:22 08/13/2015 12:02  Glucose-Capillary Latest Ref Range: 65-99 mg/dL 235 (H) 292 (H) 277 (H) 436 (H) 278 (H)  Results for Billy [name], Billy [name] (MRN [id number]) as of 08/13/2015 14:31  Ref. Range 08/08/2015 09:36  Sodium Latest Ref Range: 135-145 mmol/L 143  Potassium Latest Ref Range: 3.5-5.1 mmol/L 3.9  Chloride Latest Ref Range: 101-111 mmol/L 109  CO2 Latest Ref Range: 22-32 mmol/L 24  BUN Latest Ref Range: 6-20 mg/dL 10  Creatinine Latest Ref Range: 0.61-1.24 mg/dL 0.95  Calcium Latest Ref Range: 8.9-10.3 mg/dL 9.6  EGFR (Non-African Amer.) Latest Ref Range: >60 mL/min >60  EGFR (African American) Latest Ref Range: >60 mL/min >60  Glucose Latest Ref Range: 65-99 mg/dL 168 (H)  Anion gap Latest Ref Range: 5-15  10  HgbA1C pending. RN reports pt ate 2 nutri-grain bars prior to getting blood sugar checked this morning. Pt is homeless and does not have meter to check blood sugars.  Inpatient Diabetes Program Recommendations:    Increase meal coverage insulin - Novolog 6 units tidwc Case manager - please make appt for pt to see MD at [facility] for diabetes management Will need glucose meter and supplies - can get at [facility].   Will continue to follow while inpatient. Thank you. Lorenda Peck, RD, LDN, CDE Inpatient  Diabetes Coordinator [phone]

## 2015-08-13 NOTE — [facility] Group Notes (Signed)
[facility]   08/13/2015 3:13 PM  Participation Quality:  Invited. DID NOT ATTEND. Pt chose to rest in room.   Smart, Dayvion Sans LCSW

## 2015-08-13 NOTE — Plan of Care (Signed)
Problem: Diagnosis: Increased Risk For Suicide Attempt Goal: STG-Patient Will Attend All Groups On The Unit Outcome: Not Progressing Pt has not attended AA groups this evening

## 2015-08-13 NOTE — [facility] Group Notes (Signed)
Adult Psychoeducational Group Note  Date:  08/13/2015 Time:  9:31 PM  Group Topic/Focus:  Wrap-Up Group:   The focus of this group is to help patients review their daily goal of treatment and discuss progress on daily workbooks.  Participation Level:  Did Not Attend  Participation Quality:  None  Affect:  None  Cognitive:  None  Insight: None  Engagement in Group:  None  Modes of Intervention:  Discussion  Additional Comments:  Pt did not attend group.  Caroll RancherLindsay, Bryceton Hantz A 08/13/2015, 9:31 PM

## 2015-08-13 NOTE — Progress Notes (Signed)
D:Patient in the hallway on approach.  Patient states he had a good day.  Patient states he has been trying to have a better diet to help his blood sugars but states sometimes there are no diabetic options available.  Patient states his blood sugars are usually better at home.  Patient denies SI/HI and denies AVH. A: Staff to monitor Q 15 mins for safety.  Encouragement and support offered.  Scheduled medications administered per orders.  Nasal spray administered prn. R: Patient remains safe on the unit.  Patient attended group tonight.  Patient visible on the unit and interacting with peers.  Patient taking administered medications.

## 2015-08-13 NOTE — Progress Notes (Signed)
CSW called Billy [name] [phone] and requested that he meet with pt to discuss [facility]. Tee arrived at 3:00PM to speak with pt.   Trula SladeHeather Smart, MSW, LCSW Clinical Social Worker 08/13/2015 3:53 PM

## 2015-08-13 NOTE — [facility] Group Notes (Signed)
[facility] LCSW Group Therapy  08/13/2015 3:13 PM  Type of Therapy:  Group Therapy  Participation Level:  Active  Participation Quality:  Attentive  Affect:  Appropriate  Cognitive:  Alert and Oriented  Insight:  Improving  Engagement in Therapy:  Improving  Modes of Intervention:  Confrontation, Discussion, Education, Exploration, Problem-solving, Rapport Building, Socialization and Support  Summary of Progress/Problems: Today's Topic: Overcoming Obstacles. Patients identified one short term goal and potential obstacles in reaching this goal. Patients processed barriers involved in overcoming these obstacles. Patients identified steps necessary for overcoming these obstacles and explored motivation (internal and external) for facing these difficulties head on. Billy [name] was attentive and engaged during today's processing group.  He shared that his biggest obstacle was "staying afloat until my disability check comes in." He was receptive to information provided by CSW and other group members relating to housing/homelessness resources. Billy [name] was open to meeting with Winferd Humphreyee Gray this afternoon ([facility] founder) and is hoping to get into a halfway house at discharge.   Billy [name], Billy Purohit LCSW 08/13/2015, 3:13 PM

## 2015-08-14 LAB — GLUCOSE, CAPILLARY
GLUCOSE-CAPILLARY: 351 mg/dL — AB (ref 65–99)
GLUCOSE-CAPILLARY: 247 mg/dL — AB (ref 65–99)
GLUCOSE-CAPILLARY: 388 mg/dL — AB (ref 65–99)
Glucose-Capillary: 285 mg/dL — ABNORMAL HIGH (ref 65–99)

## 2015-08-14 MED ORDER — INSULIN ASPART 100 UNIT/ML ~~LOC~~ SOLN
2.0000 [IU] | Freq: Once | SUBCUTANEOUS | Status: AC
  Administered 2015-08-14: 2 [IU] via SUBCUTANEOUS

## 2015-08-14 MED ORDER — INSULIN ASPART 100 UNIT/ML ~~LOC~~ SOLN
6.0000 [IU] | Freq: Three times a day (TID) | SUBCUTANEOUS | Status: DC
  Administered 2015-08-14 – 2015-08-15 (×3): 6 [IU] via SUBCUTANEOUS

## 2015-08-14 NOTE — Progress Notes (Signed)
Patient did not attend the evening speaker AA meeting. Pt was notified that group was beginning and acknowledged the announcement but remained in bed until snack time.

## 2015-08-14 NOTE — Progress Notes (Signed)
Recreation Therapy Notes  Animal-Assisted Activity (AAA) Program Checklist/Progress Notes Patient Eligibility Criteria Checklist & Daily Group note for Rec Tx Intervention  Date: 03.14.2017 Time: 2:45am Location: [address]    AAA/T Program Assumption of Risk Form signed by Patient/ or Parent Legal Guardian yes  Patient is free of allergies or sever asthma yes  Patient reports no fear of animals yes  Patient reports no history of cruelty to animals yes  Patient understands his/her participation is voluntary yes  Behavioral Response: Did not attend.   Marykay Lexenise L Ellissa Ayo, LRT/CTRS  Jacksyn Beeks L 08/14/2015 3:01 PM

## 2015-08-14 NOTE — [facility] Suicide Risk Assessment (Signed)
[facility] INPATIENT:  Family/Significant Other Suicide Prevention Education  Suicide Prevention Education:  Patient Refusal for Family/Significant Other Suicide Prevention Education: The patient Billy [name] has refused to provide written consent for family/significant other to be provided Family/Significant Other Suicide Prevention Education during admission and/or prior to discharge.  Physician notified.  SPE completed with pt, as pt refused to consent to family contact. SPI pamphlet provided to pt and pt was encouraged to share information with support network, ask questions, and talk about any concerns relating to SPE. Pt denies access to guns/firearms and verbalized understanding of information provided. Mobile Crisis information also provided to pt.   Smart, Diego Delancey LCSW 08/14/2015, 9:09 AM

## 2015-08-14 NOTE — Progress Notes (Signed)
Patient ID: Maudie FlakesMelvin K Strickler, male   DOB: Mar 13, 1961, 55 y.o.   MRN: [id number]  Adult Psychoeducational Group Note  Date:  08/14/2015 Time: 09:00am  Group Topic/Focus:  Self Care:   The focus of this group is to help patients understand the importance of self-care in order to improve or restore emotional, physical, spiritual, interpersonal, and financial health.  Participation Level:  Did Not Attend  Participation Quality: n/a  Affect: n/a  Cognitive: n/a  Insight: n/a  Engagement in Group: n/a  Modes of Intervention:  Activity, Discussion, Education and Support  Additional Comments:  Pt did not attend group, pt in bed asleep.   Aurora Maskwyman, Ameyah Bangura E 08/14/2015, 9:45 AM

## 2015-08-14 NOTE — [facility] Group Notes (Signed)
[facility] LCSW Group Therapy  08/14/2015 1:58 PM  Type of Therapy:  Group Therapy  Participation Level:  Did Not Attend-pt invited. Stated that he would come to group. Did not leave room.  Summary of Progress/Problems: [facility] Speaker came to talk about his personal journey with substance abuse and addiction.   Smart, Yenifer Saccente LCSW 08/14/2015, 1:58 PM

## 2015-08-14 NOTE — Progress Notes (Signed)
[facility] MD Progress Note  08/14/2015 7:37 PM Billy [name]  MRN:  [id number] Subjective:  Billy [name] continues to work on getting better. States he will go to the [facility] tomorrow and get some temporary accommodations at the shelter until he is able to get some money for a deposit in a more stable living situation. He states he is committed to pursue his medications and continue to abstain Principal Problem: Bipolar disorder with current episode depressed (HCC) Diagnosis:   Patient Active Problem List   Diagnosis Date Noted  . Bipolar affective disorder, depressed, severe, with psychotic behavior (HCC) [F31.5] 09/14/2014    Priority: High  . Chronic pain syndrome [G89.4] 09/14/2014    Priority: High  . Cocaine abuse [F14.10] 09/13/2014    Priority: High  . Alcohol abuse [F10.10] 10/12/2012    Priority: High  . Opiate abuse, episodic [F11.10] 09/02/2011    Priority: High  . Affective psychosis, bipolar (HCC) [F31.9]   . Bipolar affective disorder, depressed (HCC) [F31.30] 08/09/2015  . Bipolar disorder with current episode depressed (HCC) [F31.30] 08/09/2015  . Polysubstance abuse [F19.10]   . Type 2 diabetes mellitus with hyperglycemia (HCC) [E11.65] 09/19/2014  . Right leg DVT (HCC) [I82.401] 09/19/2014  . Essential hypertension [I10] 09/19/2014  . Alcohol dependence with uncomplicated withdrawal (HCC) [F10.230] 09/13/2014  . Homeless [Z59.0] 09/02/2011  . Diabetes mellitus [250] 08/30/2011  . WPW (Wolff-Parkinson-White syndrome) [I45.6] 08/30/2011   Total Time spent with patient: 20 min  Past Psychiatric History: see admission H and P  Past Medical History:  Past Medical History  Diagnosis Date  . Diabetes mellitus   . Hepatitis C carrier   . WPW (Wolff-Parkinson-White syndrome)   . Depression   . Chronic pain   . Suicide attempt (HCC)   . Diabetes mellitus without complication (HCC)   . Hypertension   . Bipolar 1 disorder (HCC)   . HCV (hepatitis C virus)   . Homeless      Past Surgical History  Procedure Laterality Date  . Rhinoplasty    . Chest surgery    . Back surgery    . Tonsillectomy     Family History: History reviewed. No pertinent family history. Family Psychiatric  History: see admission H and P Social History:  History  Alcohol Use  . Yes    Comment: last drink 24 days     History  Drug Use  . Yes  . Special: Marijuana, "Crack" cocaine, Cocaine    Comment: last used 09/03/2014    Social History   Social History  . Marital Status: Divorced    Spouse Name: N/A  . Number of Children: N/A  . Years of Education: N/A   Social History Main Topics  . Smoking status: Current Every Day Smoker -- 0.50 packs/day for 8 years    Types: Cigarettes  . Smokeless tobacco: None  . Alcohol Use: Yes     Comment: last drink 24 days  . Drug Use: Yes    Special: Marijuana, "Crack" cocaine, Cocaine     Comment: last used 09/03/2014  . Sexual Activity: Not Asked   Other Topics Concern  . None   Social History Narrative   ** Merged History Encounter **       Additional Social History:                         Sleep: Fair  Appetite:  Fair  Current Medications: Current Facility-Administered Medications  Medication Dose Route  Frequency Provider Last Rate Last Dose  . acetaminophen (TYLENOL) tablet 650 mg  650 mg Oral Q4H PRN Earney NavyJosephine C Onuoha, NP      . albuterol (PROVENTIL HFA;VENTOLIN HFA) 108 (90 Base) MCG/ACT inhaler 2 puff  2 puff Inhalation Q6H PRN Earney NavyJosephine C Onuoha, NP      . alum & mag hydroxide-simeth (MAALOX/MYLANTA) 200-200-20 MG/5ML suspension 30 mL  30 mL Oral Q4H PRN Earney NavyJosephine C Onuoha, NP      . amLODipine (NORVASC) tablet 10 mg  10 mg Oral Daily Earney NavyJosephine C Onuoha, NP   10 mg at 08/14/15 0802  . ARIPiprazole (ABILIFY) tablet 7.5 mg  7.5 mg Oral BID Earney NavyJosephine C Onuoha, NP   7.5 mg at 08/14/15 1710  . citalopram (CELEXA) tablet 20 mg  20 mg Oral Daily Earney NavyJosephine C Onuoha, NP   20 mg at 08/14/15 0803  . gabapentin  (NEURONTIN) capsule 1,200 mg  1,200 mg Oral TID Earney NavyJosephine C Onuoha, NP   1,200 mg at 08/14/15 1710  . hydrOXYzine (ATARAX/VISTARIL) tablet 50 mg  50 mg Oral QHS,MR X 1 Oneta Rackanika N Lewis, NP   50 mg at 08/13/15 2122  . insulin aspart (novoLOG) injection 0-15 Units  0-15 Units Subcutaneous TID WC Earney NavyJosephine C Onuoha, NP   15 Units at 08/14/15 1715  . insulin aspart (novoLOG) injection 6 Units  6 Units Subcutaneous TID WC Sanjuana KavaAgnes I Nwoko, NP   6 Units at 08/14/15 1716  . insulin glargine (LANTUS) injection 60 Units  60 Units Subcutaneous QHS Earney NavyJosephine C Onuoha, NP   60 Units at 08/13/15 2123  . lisinopril (PRINIVIL,ZESTRIL) tablet 20 mg  20 mg Oral Daily Earney NavyJosephine C Onuoha, NP   20 mg at 08/14/15 0803  . magnesium hydroxide (MILK OF MAGNESIA) suspension 30 mL  30 mL Oral Daily PRN Earney NavyJosephine C Onuoha, NP   30 mL at 08/13/15 1443  . metFORMIN (GLUCOPHAGE) tablet 500 mg  500 mg Oral BID WC Sanjuana KavaAgnes I Nwoko, NP   500 mg at 08/13/15 1700  . nicotine (NICODERM CQ - dosed in mg/24 hours) patch 21 mg  21 mg Transdermal Daily Rachael FeeIrving A Sorcha Rotunno, MD   21 mg at 08/13/15 0800  . ondansetron (ZOFRAN) tablet 4 mg  4 mg Oral Q8H PRN Earney NavyJosephine C Onuoha, NP      . oxymetazoline (AFRIN) 0.05 % nasal spray 1 spray  1 spray Each Nare BID PRN Earney NavyJosephine C Onuoha, NP   1 spray at 08/14/15 1806  . pantoprazole (PROTONIX) EC tablet 40 mg  40 mg Oral Daily Earney NavyJosephine C Onuoha, NP   40 mg at 08/14/15 0802  . pravastatin (PRAVACHOL) tablet 10 mg  10 mg Oral QHS Earney NavyJosephine C Onuoha, NP   10 mg at 08/13/15 2122  . tiotropium (SPIRIVA) inhalation capsule 18 mcg  18 mcg Inhalation Daily Earney NavyJosephine C Onuoha, NP   18 mcg at 08/13/15 0805  . tiZANidine (ZANAFLEX) tablet 4 mg  4 mg Oral TID Rachael FeeIrving A Mardie Kellen, MD   4 mg at 08/14/15 1710  . traZODone (DESYREL) tablet 300 mg  300 mg Oral QHS Rachael FeeIrving A Mileigh Tilley, MD   300 mg at 08/13/15 2122    Lab Results:  Results for orders placed or performed during the hospital encounter of 08/09/15 (from the past 48 hour(s))   Glucose, capillary     Status: Abnormal   Collection Time: 08/12/15  9:07 PM  Result Value Ref Range   Glucose-Capillary 277 (H) 65 - 99 mg/dL   Comment 1  Notify RN    Comment 2 Document in Chart   Glucose, capillary     Status: Abnormal   Collection Time: 08/13/15  6:22 AM  Result Value Ref Range   Glucose-Capillary 436 (H) 65 - 99 mg/dL  Glucose, capillary     Status: Abnormal   Collection Time: 08/13/15 12:02 PM  Result Value Ref Range   Glucose-Capillary 278 (H) 65 - 99 mg/dL   Comment 1 Notify RN   Glucose, capillary     Status: Abnormal   Collection Time: 08/13/15  5:01 PM  Result Value Ref Range   Glucose-Capillary 307 (H) 65 - 99 mg/dL   Comment 1 Notify RN   Glucose, capillary     Status: Abnormal   Collection Time: 08/13/15  8:10 PM  Result Value Ref Range   Glucose-Capillary 282 (H) 65 - 99 mg/dL  Glucose, capillary     Status: Abnormal   Collection Time: 08/14/15  5:52 AM  Result Value Ref Range   Glucose-Capillary 351 (H) 65 - 99 mg/dL  Glucose, capillary     Status: Abnormal   Collection Time: 08/14/15 11:45 AM  Result Value Ref Range   Glucose-Capillary 247 (H) 65 - 99 mg/dL   Comment 1 Notify RN   Glucose, capillary     Status: Abnormal   Collection Time: 08/14/15  5:09 PM  Result Value Ref Range   Glucose-Capillary 388 (H) 65 - 99 mg/dL   Comment 1 Notify RN     Blood Alcohol level:  Lab Results  Component Value Date   ETH <5 08/08/2015   ETH <5 09/12/2014    Physical Findings: AIMS: Facial and Oral Movements Muscles of Facial Expression: None, normal Lips and Perioral Area: None, normal Jaw: None, normal Tongue: None, normal,Extremity Movements Upper (arms, wrists, hands, fingers): None, normal Lower (legs, knees, ankles, toes): None, normal, Trunk Movements Neck, shoulders, hips: None, normal, Overall Severity Severity of abnormal movements (highest score from questions above): None, normal Incapacitation due to abnormal movements: None,  normal Patient's awareness of abnormal movements (rate only patient's report): No Awareness, Dental Status Current problems with teeth and/or dentures?: Yes (Pt is edentulous) Does patient usually wear dentures?: No  CIWA:  CIWA-Ar Total: 2 COWS:     Musculoskeletal: Strength & Muscle Tone: within normal limits Gait & Station: normal Patient leans: normal  Psychiatric Specialty Exam: Review of Systems  Constitutional: Negative.   HENT: Negative.   Eyes: Negative.   Respiratory: Negative.   Cardiovascular: Negative.   Gastrointestinal: Negative.   Genitourinary: Negative.   Musculoskeletal: Negative.   Skin: Negative.   Neurological: Negative.   Endo/Heme/Allergies: Negative.   Psychiatric/Behavioral: Positive for substance abuse. The patient is nervous/anxious.     Blood pressure 102/76, pulse 80, temperature 98.5 F (36.9 C), temperature source Oral, resp. rate 18, height 6' (1.829 m), weight 94.348 kg (208 lb), SpO2 98 %.Body mass index is 28.2 kg/(m^2).  General Appearance: Fairly Groomed  Patent attorney::  Fair  Speech:  Clear and Coherent  Volume:  fluctuates  Mood:  Anxious  Affect:  anxious worried  Thought Process:  Coherent and Goal Directed  Orientation:  Full (Time, Place, and Person)  Thought Content:  symptoms events worries concerns  Suicidal Thoughts:  No  Homicidal Thoughts:  No  Memory:  Immediate;   Fair Recent;   Fair Remote;   Fair  Judgement:  Fair  Insight:  Present and Shallow  Psychomotor Activity:  Normal  Concentration:  Fair  Recall:  Fair  Fund of Knowledge:Fair  Language: Fair  Akathisia:  No  Handed:  Right  AIMS (if indicated):     Assets:  Desire for Improvement  ADL's:  Intact  Cognition: WNL  Sleep:  Number of Hours: 6   Treatment Plan Summary: Daily contact with patient to assess and evaluate symptoms and progress in treatment and Medication management Supportive approach/coping skills Substance abuse; work a relapse  prevention plan Depression; continue the Celexa 20 mg daily Mood instability; continue the Abilify 7.5 mg daily Insomnia; continue the Trazodone 300 mg HS Work with CBT/mindfulness Aqua Denslow A, MD 08/14/2015, 7:37 PM

## 2015-08-14 NOTE — Progress Notes (Signed)
Patient ID: Billy FlakesMelvin K Isidro, male   DOB: 19-Oct-1960, 55 y.o.   MRN: [id number]  Pt currently presents with a sad affect and anxious behavior. Per self inventory, pt rates depression at a 8, hopelessness 8 and anxiety 5. Pt's daily goal is to "not 2 hurt myself" and they intend to do so by "sleep." Pt reports good sleep, a good appetite, low energy and good concentration. Pt remains in the milieu this afternoon, interacts with peers positively.   Pt provided with medications per providers orders. Pt's labs and vitals were monitored throughout the day. Pt supported emotionally and encouraged to express concerns and questions. Pt educated on medications.  Pt's safety ensured with 15 minute and environmental checks. Pt currently denies SI/HI and A/V hallucinations. Pt verbally agrees to seek staff if SI/HI or A/VH occurs and to consult with staff before acting on these thoughts. Will continue POC.

## 2015-08-14 NOTE — Progress Notes (Signed)
D:Patient in bed on approach.  Patient states he had a good day.  Patient states he has tried to make better meal choices but states his blood sugars have still been high.  Patient states his depression is minimal.  Patient denies SI/HI and denies AVH.   A: Staff to monitor Q 15 mins for safety.  Encouragement and support offered.  Scheduled medications administered per orders.   R: Patient remains safe on the unit.  Patient did not attend group tonight.  Patient visible on the unit for snacks.  Patient taking administered medications.

## 2015-08-15 LAB — GLUCOSE, CAPILLARY
GLUCOSE-CAPILLARY: 275 mg/dL — AB (ref 65–99)
GLUCOSE-CAPILLARY: 315 mg/dL — AB (ref 65–99)

## 2015-08-15 MED ORDER — AMLODIPINE BESYLATE 10 MG PO TABS: 10.0000 mg | ORAL_TABLET | Freq: Every day | ORAL | Status: DC

## 2015-08-15 MED ORDER — CITALOPRAM HYDROBROMIDE 20 MG PO TABS: 20.0000 mg | ORAL_TABLET | Freq: Every day | ORAL | Status: DC

## 2015-08-15 MED ORDER — TIZANIDINE HCL 2 MG PO TABS: 4.0000 mg | ORAL_TABLET | Freq: Three times a day (TID) | ORAL | Status: DC

## 2015-08-15 MED ORDER — ALBUTEROL SULFATE HFA 108 (90 BASE) MCG/ACT IN AERS: 2.0000 | INHALATION_SPRAY | Freq: Four times a day (QID) | RESPIRATORY_TRACT | Status: DC | PRN

## 2015-08-15 MED ORDER — OXYMETAZOLINE HCL 0.05 % NA SOLN: 1.0000 | Freq: Two times a day (BID) | NASAL | Status: DC | PRN

## 2015-08-15 MED ORDER — INSULIN ASPART 100 UNIT/ML ~~LOC~~ SOLN: 6.0000 [IU] | Freq: Three times a day (TID) | SUBCUTANEOUS | Status: DC

## 2015-08-15 MED ORDER — HYDROXYZINE HCL 50 MG PO TABS: 50.0000 mg | ORAL_TABLET | Freq: Every evening | ORAL | Status: DC | PRN

## 2015-08-15 MED ORDER — OMEPRAZOLE 20 MG PO CPDR: 40.0000 mg | DELAYED_RELEASE_CAPSULE | Freq: Every day | ORAL | Status: DC

## 2015-08-15 MED ORDER — PRAVASTATIN SODIUM 10 MG PO TABS: 10.0000 mg | ORAL_TABLET | Freq: Every day | ORAL | Status: DC

## 2015-08-15 MED ORDER — INSULIN ASPART 100 UNIT/ML ~~LOC~~ SOLN: 3.0000 [IU] | Freq: Three times a day (TID) | SUBCUTANEOUS | Status: DC

## 2015-08-15 MED ORDER — ARIPIPRAZOLE 15 MG PO TABS
7.5000 mg | ORAL_TABLET | Freq: Two times a day (BID) | ORAL | Status: DC
Start: 2015-08-15 — End: 2016-05-22

## 2015-08-15 MED ORDER — NICOTINE 21 MG/24HR TD PT24: 21.0000 mg | MEDICATED_PATCH | Freq: Every day | TRANSDERMAL | Status: DC

## 2015-08-15 MED ORDER — TRAZODONE HCL 300 MG PO TABS: 300.0000 mg | ORAL_TABLET | Freq: Every day | ORAL | Status: DC

## 2015-08-15 MED ORDER — LISINOPRIL 20 MG PO TABS: 20.0000 mg | ORAL_TABLET | Freq: Every day | ORAL | Status: DC

## 2015-08-15 MED ORDER — TIOTROPIUM BROMIDE MONOHYDRATE 18 MCG IN CAPS: 18.0000 ug | ORAL_CAPSULE | Freq: Every day | RESPIRATORY_TRACT | Status: DC

## 2015-08-15 MED ORDER — GABAPENTIN 400 MG PO CAPS: 1200.0000 mg | ORAL_CAPSULE | Freq: Three times a day (TID) | ORAL | Status: DC

## 2015-08-15 MED ORDER — METFORMIN HCL 500 MG PO TABS: 500.0000 mg | ORAL_TABLET | Freq: Two times a day (BID) | ORAL | Status: DC

## 2015-08-15 MED ORDER — INSULIN GLARGINE 100 UNIT/ML ~~LOC~~ SOLN: 60.0000 [IU] | Freq: Every day | SUBCUTANEOUS | Status: DC

## 2015-08-15 NOTE — Progress Notes (Signed)
Recreation Therapy Notes  Date: 03.15.2017 Time: 9:30am Location: 300 Hall Group Room   Group Topic: Stress Management  Goal Area(s) Addresses:  Patient will actively participate in stress management techniques presented during session.   Behavioral Response: Did not attend.   Billy [name], LRT/CTRS        Emme Rosenau L 08/15/2015 1:46 PM 

## 2015-08-15 NOTE — Tx Team (Signed)
Interdisciplinary Treatment Plan Update (Adult)  Date:  08/15/2015  Time Reviewed:  9:34 AM   Progress in Treatment: Attending groups: Yes Participating in groups:  Yes Taking medication as prescribed:  Yes. Tolerating medication:  Yes. Family/Significant othe contact made:  SPE completed with pt, as he declined to consent to family contact.  Patient understands diagnosis:  Yes. and As evidenced by:  seeking treatment for increased depression, SI, and cocaine abuse. Discussing patient identified problems/goals with staff:  Yes. Medical problems stabilized or resolved:  Yes. Denies suicidal/homicidal ideation: Yes. Issues/concerns per patient self-inventory:  Other:  Discharge Plan or Barriers: Pt plans to go to [facility] for PATH assessment. He has been given bus passes and plans to follow-up at [facility]. Pt has been given PATH/IRC handout, Mental health association information, [employer] account planner, [employer], and boarding house/extended stay hotel list.   Reason for Continuation of Hospitalization: None  Comments:  Patient was admitted to the unit due to increased suicidal thought. Patient acknowledged feelings of depression and stated that he wanted to walk in front of traffic or cut his throat with a knife. Pt UDS positive for cocaine only.   Estimated length of stay:  D/c today  Additional Comments:  Patient and CSW reviewed pt's identified goals and treatment plan. Patient verbalized understanding and agreed to treatment plan. CSW reviewed [facility] "Discharge Process and Patient Involvement" Form. Pt verbalized understanding of information provided and signed form.    Review of initial/current patient goals per problem list:  1. Goal(s): Patient will participate in aftercare plan  Met: Yes  Target date: at discharge  As evidenced by: Patient will participate within aftercare plan AEB aftercare provider and housing plan at discharge being  identified.  3/10 :CSW assessing for appropriate referrals.   3/15: Pt reports that he plans to go to [facility] for placement at shelter and follow-up at [facility].   2. Goal (s): Patient will exhibit decreased depressive symptoms and suicidal ideations.  Met: Yes   Target date: at discharge  As evidenced by: Patient will utilize self rating of depression at 3 or below and demonstrate decreased signs of depression or be deemed stable for discharge by MD.  3/10: Pt rates depression as high. Denies SI/HI/AVH today.   3/15: Pt reports no depression today. Denies SI/HI/AVH.   3. Goal(s): Patient will demonstrate decreased signs of withdrawal due to substance abuse  Met:Yes   Target date:at discharge   As evidenced by: Patient will produce a CIWA/COWS score of 0, have stable vitals signs, and no symptoms of withdrawal.  3/10: Pt reports mild withdrawals with CIWA not taken and high pulse/BP.   3/15: Pt reports no withdrawals with no CIWA and stable vitals.  Attendees: Patient:   08/15/2015 9:34 AM   Family:   08/15/2015 9:34 AM   Physician:  Dr. Carlton Adam, MD 08/15/2015 9:34 AM   Nursing:   Babette Relic RN  08/15/2015 9:34 AM   Clinical Social Worker: Maxie Better, LCSW 08/15/2015 9:34 AM   Clinical Social Worker: Erasmo Downer Drinkard LCSW; Peri Maris LCSWA 08/15/2015 9:34 AM   Other:  Gerline Legacy Nurse Case Manager 08/15/2015 9:34 AM   Other:  Agustina Caroli NP ; May Augustin RN 08/15/2015 9:34 AM   Other:   08/15/2015 9:34 AM   Other:  08/15/2015 9:34 AM   Other:  08/15/2015 9:34 AM   Other:  08/15/2015 9:34 AM    08/15/2015 9:34 AM  08/15/2015 9:34 AM    08/15/2015 9:34 AM    08/15/2015 9:34 AM    Scribe for Treatment Team:   Maxie Better, LCSW 08/15/2015 9:34 AM

## 2015-08-15 NOTE — [facility] Suicide Risk Assessment (Signed)
[facility]   Principal Problem: Bipolar disorder with current episode depressed [facility](HCC) Discharge Diagnoses:  Patient Active Problem List   Diagnosis Date Noted  . Bipolar affective disorder, depressed, severe, with psychotic behavior (HCC) [F31.5] 09/14/2014    Priority: High  . Chronic pain syndrome [G89.4] 09/14/2014    Priority: High  . Cocaine abuse [F14.10] 09/13/2014    Priority: High  . Alcohol abuse [F10.10] 10/12/2012    Priority: High  . Opiate abuse, episodic [F11.10] 09/02/2011    Priority: High  . Affective psychosis, bipolar (HCC) [F31.9]   . Bipolar affective disorder, depressed (HCC) [F31.30] 08/09/2015  . Bipolar disorder with current episode depressed (HCC) [F31.30] 08/09/2015  . Polysubstance abuse [F19.10]   . Type 2 diabetes mellitus with hyperglycemia (HCC) [E11.65] 09/19/2014  . Right leg DVT (HCC) [I82.401] 09/19/2014  . Essential hypertension [I10] 09/19/2014  . Alcohol dependence with uncomplicated withdrawal (HCC) [F10.230] 09/13/2014  . Homeless [Z59.0] 09/02/2011  . Diabetes mellitus [250] 08/30/2011  . WPW (Wolff-Parkinson-White syndrome) [I45.6] 08/30/2011    Total Time spent with patient: 20 minutes  Musculoskeletal: Strength & Muscle Tone: within normal limits Gait & Station: normal Patient leans: normal  Psychiatric Specialty Exam: Review of Systems  Constitutional: Negative.   HENT: Negative.   Eyes: Negative.   Respiratory: Negative.   Cardiovascular: Negative.   Gastrointestinal: Negative.   Genitourinary: Negative.   Musculoskeletal: Negative.   Skin: Negative.   Neurological: Negative.   Endo/Heme/Allergies: Negative.   Psychiatric/Behavioral: Positive for substance abuse.    Blood pressure 117/68, pulse 76, temperature 98 F (36.7 C), temperature source Oral, resp. rate 20, height 6' (1.829 m), weight 94.348 kg (208 lb), SpO2 98 %.Body mass index is 28.2 kg/(m^2).  General Appearance: Fairly  Groomed  Patent attorneyye Contact::  Fair  Speech:  Clear and Coherent409  Volume:  Normal  Mood:  Euthymic  Affect:  Appropriate  Thought Process:  Coherent and Goal Directed  Orientation:  Full (Time, Place, and Person)  Thought Content:  plans as he moves on, relapse prevention plan  Suicidal Thoughts:  No  Homicidal Thoughts:  No  Memory:  Immediate;   Fair Recent;   Fair Remote;   Fair  Judgement:  Fair  Insight:  Present  Psychomotor Activity:  Normal  Concentration:  Fair  Recall:  [employer]  Fund of Knowledge:Fair  Language: Fair  Akathisia:  No  Handed:  Right  AIMS (if indicated):     Assets:  Desire for Improvement  Sleep:  Number of Hours: 5.5  Cognition: WNL  ADL's:  Intact  In full contact with reality. There are no active S/S of withdrawal. There are no active SI plans or intent. [facility] for the next few weeks while he gathers the funds to get amore permanent placement Mental Status Per Nursing Assessment::   On Admission:     Demographic Factors:  Male and Caucasian  Loss Factors: Financial stress  Historical Factors: none identified  Risk Reduction Factors:   wants to do better  Continued Clinical Symptoms:  Bipolar Disorder:   Depressive phase Alcohol/Substance Abuse/Dependencies  Cognitive Features That Contribute To Risk:  None    Suicide Risk:  Minimal: No identifiable suicidal ideation.  Patients presenting with no risk factors but with morbid ruminations; may be classified as minimal risk based on the severity of the depressive symptoms  Follow-up Information    [facility] with [facility] [place]   Why:  Walk in between 8am-12pm Monday through Friday for hospital follow-up/medication management/counseling services.    Contact information:   [address], [place] [address] Phone: [phone] Fax: [phone]      Plan Of Care/Follow-up recommendations:  Activity:  as  tolerated Diet:  diabetic diet Follow up as above [place] A, MD 08/15/2015, 11:49 AM

## 2015-08-15 NOTE — Discharge Summary (Signed)
Physician Discharge Summary Note  Patient:  Billy [name] is an 55 y.o., male MRN:  [id number] DOB:  Jun 27, 1960 Patient phone:  [phone] (home)  Patient address:   [address] [place] [place] [address],   Total Time spent with patient: Greater than 30 minutes  Date of Admission:  08/09/2015  Date of Discharge: 08-15-15  Reason for Admission: Worsening symptoms of depression, substance abuse issues.   Principal Problem: Bipolar disorder with current episode depressed [facility])  Discharge Diagnoses: Patient Active Problem List   Diagnosis Date Noted  . Affective psychosis, bipolar (HCC) [F31.9]   . Bipolar affective disorder, depressed (HCC) [F31.30] 08/09/2015  . Bipolar disorder with current episode depressed (HCC) [F31.30] 08/09/2015  . Polysubstance abuse [F19.10]   . Type 2 diabetes mellitus with hyperglycemia (HCC) [E11.65] 09/19/2014  . Right leg DVT (HCC) [I82.401] 09/19/2014  . Essential hypertension [I10] 09/19/2014  . Bipolar affective disorder, depressed, severe, with psychotic behavior (HCC) [F31.5] 09/14/2014  . Chronic pain syndrome [G89.4] 09/14/2014  . Alcohol dependence with uncomplicated withdrawal (HCC) [F10.230] 09/13/2014  . Cocaine abuse [F14.10] 09/13/2014  . Alcohol abuse [F10.10] 10/12/2012  . Homeless [Z59.0] 09/02/2011  . Opiate abuse, episodic [F11.10] 09/02/2011  . Diabetes mellitus [250] 08/30/2011  . WPW (Wolff-Parkinson-White syndrome) [I45.6] 08/30/2011   Musculoskeletal: Strength & Muscle Tone: within normal limits Gait & Station: normal Patient leans: N/A  Psychiatric Specialty Exam: See Suicide Risk Assessment Physical Exam  Constitutional: He is oriented to person, place, and time. He appears well-developed.  HENT:  Head: Normocephalic.  Neck: Normal range of motion.  Cardiovascular: Normal rate.   Respiratory: Effort normal.  GI: Soft.  Genitourinary:  Denies any issues in this area  Musculoskeletal: Normal range of motion.   Neurological: He is alert and oriented to person, place, and time.  Skin: Skin is warm and dry.    Review of Systems  Constitutional: Negative.   HENT: Negative.   Eyes: Negative.   Respiratory: Negative.   Cardiovascular: Negative.   Gastrointestinal: Negative.   Genitourinary: Negative.   Musculoskeletal: Negative.   Skin: Negative.   Neurological: Negative.   Endo/Heme/Allergies: Negative.   Psychiatric/Behavioral: Positive for depression (Stable) and substance abuse (Hx. Cocaine dependence). Negative for suicidal ideas, hallucinations and memory loss. The patient has insomnia (Stable). The patient is not nervous/anxious.     Blood pressure 117/68, pulse 76, temperature 98 F (36.7 C), temperature source Oral, resp. rate 20, height 6' (1.829 m), weight 94.348 kg (208 lb), SpO2 98 %.Body mass index is 28.2 kg/(m^2).  See Md's SRA  Past Medical History:  Past Medical History  Diagnosis Date  . Diabetes mellitus   . Hepatitis C carrier   . WPW (Wolff-Parkinson-White syndrome)   . Depression   . Chronic pain   . Suicide attempt (HCC)   . Diabetes mellitus without complication (HCC)   . Hypertension   . Bipolar 1 disorder (HCC)   . HCV (hepatitis C virus)   . Homeless     Past Surgical History  Procedure Laterality Date  . Rhinoplasty    . Chest surgery    . Back surgery    . Tonsillectomy     Family History: History reviewed. No pertinent family history.  Social History:  History  Alcohol Use  . Yes    Comment: last drink 24 days     History  Drug Use  . Yes  . Special: Marijuana, "Crack" cocaine, Cocaine    Comment: last used 09/03/2014    Social  History   Social History  . Marital Status: Divorced    Spouse Name: N/A  . Number of Children: N/A  . Years of Education: N/A   Social History Main Topics  . Smoking status: Current Every Day Smoker -- 0.50 packs/day for 8 years    Types: Cigarettes  . Smokeless tobacco: None  . Alcohol Use: Yes      Comment: last drink 24 days  . Drug Use: Yes    Special: Marijuana, "Crack" cocaine, Cocaine     Comment: last used 09/03/2014  . Sexual Activity: Not Asked   Other Topics Concern  . None   Social History Narrative   ** Merged History Encounter **        Risk to Self: No Risk to Others: No Prior Inpatient Therapy: Yes ([facility] x multiple times) Prior Outpatient Therapy: Yes Vesta Mixer([facility])  [facility]  Hospital Course:  55 Y/O male who states that after he left here on September 21 2014, [facility] then [facility]. He got out May 05 2015. Then a rooming house what states was full of"crack heads and bed bugs". Left there last Monday. He relapsed on cocaine. Admits to feeling increasingly more depressed, despondent suicidal. States he feels hopeless & helpless. States if this is what the way his life was going to be he would rather kill himself.  Billy [name] for worsening symptoms of depression. He also recently relapsed on his cocaine use. He stated on admission, frustrated on how his was going & would rather die than to continue on that path. During his admission assessment, he was evaluated and his symptoms were identified. Medication management were discussed & initiated targeting those presenting symptoms. He was oriented to the unit and encouraged to participate in the unit programming. His other pre-existing medical problems were identified and treated appropriately by resuming his pertinent home medication for those health issues including diabetic consults & recommendations.  During his hospital stay, Billy [name] was evaluated each day by a clinical provider to ascertain his response to his treatment regimen. Medication changes & adjustments were made according to need. As his treatment progressed, improvement was noted as evidenced by his report of decreasing symptoms, improved sleep, medication tolerance & participation in  the unit programming.  He was required on daily basis to complete a self inventory asssessment noting mood, any new symptoms, anxiety or concerns. His symptoms responded well to his treatment regimen, being in a therapeutic and supportive environment also assisted in his mood stability. Billy [name] did present appropriate behavior & was motivated for recovery. He worked closely with the treatment team and case manager to develop a discharge plan with appropriate goals to maintain mood stability after discharge. Coping skills, problem solving as well as relaxation therapies were also part of the unit programming.  On this day of his discharge, Billy [name] was in much improved condition than upon admission. His symptoms were reported as significantly decreased or resolved completely. Upon discharge, he denies SIHI & voiced no AVH. He was motivated to continue taking medication with a goal of continued improvement in mental health. He was medicated & discharge on; Abilify 15 mg for mood stabilization, Gabapentin 400 mg for agitation, Citalopram 20 mg for depression, Hydroxyzine 50 mg prn for anxiety/insomnia & Trazodone 300 mg for insomnia. He tolerated his treatment regimen without problems. Billy [name] will follow-up care as noted below. He left [facility] with all  belongings in no apparent distress. Transportation per city bus. [facility] assisted with bus pass.  Consults:  psychiatry  Discharge Vitals:   Blood pressure 117/68, pulse 76, temperature 98 F (36.7 C), temperature source Oral, resp. rate 20, height 6' (1.829 m), weight 94.348 kg (208 lb), SpO2 98 %. Body mass index is 28.2 kg/(m^2). Lab Results:   Results for orders placed or performed during the hospital encounter of 08/09/15 (from the past 72 hour(s))  Glucose, capillary     Status: Abnormal   Collection Time: 08/13/15 12:02 PM  Result Value Ref Range   Glucose-Capillary 278 (H) 65 - 99 mg/dL   Comment 1 Notify RN   Glucose, capillary     Status: Abnormal    Collection Time: 08/13/15  5:01 PM  Result Value Ref Range   Glucose-Capillary 307 (H) 65 - 99 mg/dL   Comment 1 Notify RN   Glucose, capillary     Status: Abnormal   Collection Time: 08/13/15  8:10 PM  Result Value Ref Range   Glucose-Capillary 282 (H) 65 - 99 mg/dL  Glucose, capillary     Status: Abnormal   Collection Time: 08/14/15  5:52 AM  Result Value Ref Range   Glucose-Capillary 351 (H) 65 - 99 mg/dL  Glucose, capillary     Status: Abnormal   Collection Time: 08/14/15 11:45 AM  Result Value Ref Range   Glucose-Capillary 247 (H) 65 - 99 mg/dL   Comment 1 Notify RN   Glucose, capillary     Status: Abnormal   Collection Time: 08/14/15  5:09 PM  Result Value Ref Range   Glucose-Capillary 388 (H) 65 - 99 mg/dL   Comment 1 Notify RN   Glucose, capillary     Status: Abnormal   Collection Time: 08/14/15  8:58 PM  Result Value Ref Range   Glucose-Capillary 285 (H) 65 - 99 mg/dL  Glucose, capillary     Status: Abnormal   Collection Time: 08/15/15  6:02 AM  Result Value Ref Range   Glucose-Capillary 275 (H) 65 - 99 mg/dL   Comment 1 Notify RN    Comment 2 Document in Chart   Glucose, capillary     Status: Abnormal   Collection Time: 08/15/15 11:57 AM  Result Value Ref Range   Glucose-Capillary 315 (H) 65 - 99 mg/dL   Comment 1 Notify RN    Physical Findings:  AIMS: Facial and Oral Movements Muscles of Facial Expression: None, normal Lips and Perioral Area: None, normal Jaw: None, normal Tongue: None, normal,Extremity Movements Upper (arms, wrists, hands, fingers): None, normal Lower (legs, knees, ankles, toes): None, normal, Trunk Movements Neck, shoulders, hips: None, normal, Overall Severity Severity of abnormal movements (highest score from questions above): None, normal Incapacitation due to abnormal movements: None, normal Patient's awareness of abnormal movements (rate only patient's report): No Awareness, Dental Status Current problems with teeth and/or  dentures?: Yes Does patient usually wear dentures?: No  CIWA:  CIWA-Ar Total: 2 COWS:     See Psychiatric Specialty Exam and Suicide Risk Assessment completed by Attending Physician prior to discharge.  Discharge destination:  Home  Is patient on multiple antipsychotic therapies at discharge:  No   Has Patient had three or more failed trials of antipsychotic monotherapy by history:  No  Recommended Plan for Multiple Antipsychotic Therapies: NA    Medication List    STOP taking these medications        docusate sodium 100 MG capsule  Commonly known as:  COLACE  TAKE these medications      Indication   albuterol 108 (90 Base) MCG/ACT inhaler  Commonly known as:  PROAIR HFA  Inhale 2 puffs into the lungs every 6 (six) hours as needed for shortness of breath.   Indication:  Asthma, Chronic Obstructive Lung Disease     amLODipine 10 MG tablet  Commonly known as:  NORVASC  Take 1 tablet (10 mg total) by mouth daily. For high blood pressure   Indication:  High Blood Pressure     ARIPiprazole 15 MG tablet  Commonly known as:  ABILIFY  Take 0.5 tablets (7.5 mg total) by mouth 2 (two) times daily. For mood control   Indication:  Mood control     citalopram 20 MG tablet  Commonly known as:  CELEXA  Take 1 tablet (20 mg total) by mouth daily. For depression   Indication:  Depression     gabapentin 400 MG capsule  Commonly known as:  NEURONTIN  Take 3 capsules (1,200 mg total) by mouth 3 (three) times daily. For agitation   Indication:  Agitation     hydrOXYzine 50 MG tablet  Commonly known as:  ATARAX/VISTARIL  Take 1 tablet (50 mg total) by mouth at bedtime and may repeat dose one time if needed. For anxiety/insomnia   Indication:  Anxiety/insomnia     insulin aspart 100 UNIT/ML injection  Commonly known as:  novoLOG  Inject 6 Units into the skin 3 (three) times daily with meals. For diabetes management   Indication:  Type 2 Diabetes     insulin glargine 100  UNIT/ML injection  Commonly known as:  LANTUS  Inject 0.6 mLs (60 Units total) into the skin at bedtime. For diabetes managment   Indication:  Type 2 Diabetes     lisinopril 20 MG tablet  Commonly known as:  PRINIVIL,ZESTRIL  Take 1 tablet (20 mg total) by mouth daily. For high blood pressure   Indication:  High Blood Pressure     metFORMIN 500 MG tablet  Commonly known as:  GLUCOPHAGE  Take 1 tablet (500 mg total) by mouth 2 (two) times daily with a meal. For diabetes management   Indication:  Type 2 Diabetes     nicotine 21 mg/24hr patch  Commonly known as:  NICODERM CQ - dosed in mg/24 hours  Place 1 patch (21 mg total) onto the skin daily. For nicotine addiction   Indication:  Nicotine Addiction     omeprazole 20 MG capsule  Commonly known as:  PRILOSEC  Take 2 capsules (40 mg total) by mouth daily. For acid reflux   Indication:  Gastroesophageal Reflux Disease     oxymetazoline 0.05 % nasal spray  Commonly known as:  AFRIN  Place 1 spray into both nostrils 2 (two) times daily as needed for congestion.   Indication:  Stuffy Nose     pravastatin 10 MG tablet  Commonly known as:  PRAVACHOL  Take 1 tablet (10 mg total) by mouth at bedtime. For high cholesterol   Indication:  Inherited Heterozygous Hypercholesterolemia, Type II B Hyperlipidemia     tiotropium 18 MCG inhalation capsule  Commonly known as:  SPIRIVA  Place 1 capsule (18 mcg total) into inhaler and inhale daily. For COPD   Indication:  Chronic Obstructive Lung Disease     tiZANidine 2 MG tablet  Commonly known as:  ZANAFLEX  Take 2 tablets (4 mg total) by mouth 3 (three) times daily. For pain   Indication:  Muscle Spasticity  trazodone 300 MG tablet  Commonly known as:  DESYREL  Take 1 tablet (300 mg total) by mouth at bedtime. For sleep   Indication:  Trouble Sleeping       Follow-up Information    Follow up with [facility].   Why:  Walk in between 8am-12pm Monday through  Friday for hospital follow-up/medication management/counseling services.    Contact information:   [address] [place] [address] Phone: [phone] Fax: [phone]     Follow-up recommendations: Activity:  As tolerated Diet: As recommended by your primary care doctor. Keep all scheduled follow-up appointments as recommended.   Comments:  Take all your medications as prescribed by your mental healthcare provider. Report any adverse effects and or reactions from your medicines to your outpatient provider promptly. Patient is instructed and cautioned to not engage in alcohol and or illegal drug use while on prescription medicines. In the event of worsening symptoms, patient is instructed to call the crisis hotline, 911 and or go to the nearest ED for appropriate evaluation and treatment of symptoms. Follow-up with your primary care provider for your other medical issues, concerns and or health care needs.    Signed: Sanjuana Kava, FNP, PMHNP-BC 08/16/2015, 10:54 AM  I personally assessed the patient and formulated the plan Madie Reno A. Dub Mikes, M.D.

## 2015-08-15 NOTE — Progress Notes (Signed)
Pt discharged home with husband. Pt was ambulatory, stable and appreciative at that time. All papers and prescriptions were given and valuables returned. Verbal understanding expressed. Husband present at the time. Denies SI/HI and A/VH. Pt given opportunity to express concerns and ask questions.

## 2015-08-15 NOTE — Progress Notes (Signed)
  [facility] Discharge [facility] :  Will you be returning to the same living situation after discharge:  No. [facility]  At discharge, do you have transportation home?: Yes,  bus passes in chart Do you have the ability to pay for your medications: Yes,  Medicare  Release of information consent forms completed and submitted to medical records by CSW. Patient to Follow up at: Follow-up Information    Follow up with [facility].   Why:  Walk in between 8am-12pm Monday through Friday for hospital follow-up/medication management/counseling services.       Next level of care provider has access to [facility] Link:no  Safety Planning and Suicide Prevention discussed: Yes,  SPE completed with pt, as he declined to consent to family contact. SPI pamphlet and Mobile Crisis information provided.   Have you used any form of tobacco in the last 30 days? (Cigarettes, Smokeless Tobacco, Cigars, and/or Pipes): Yes  Has patient been referred to the Quitline?: Patient refused referral  Patient has been referred for addiction treatment: Yes  Smart, Riker Collier LCSW 08/15/2015, 9:39 AM

## 2015-08-28 ENCOUNTER — Other Ambulatory Visit (HOSPITAL_COMMUNITY): Payer: Self-pay | Admitting: Nurse Practitioner

## 2015-08-28 DIAGNOSIS — B1920 Unspecified viral hepatitis C without hepatic coma: Secondary | ICD-10-CM

## 2015-09-25 ENCOUNTER — Ambulatory Visit (HOSPITAL_COMMUNITY)
Admission: RE | Admit: 2015-09-25 | Discharge: 2015-09-25 | Disposition: A | Discharge status: Home / Self Care | Payer: Medicare Other | Source: Ambulatory Visit | Attending: Nurse Practitioner | Admitting: Nurse Practitioner

## 2015-09-25 DIAGNOSIS — B1920 Unspecified viral hepatitis C without hepatic coma: Secondary | ICD-10-CM | POA: Insufficient documentation

## 2015-09-25 DIAGNOSIS — N1330 Unspecified hydronephrosis: Secondary | ICD-10-CM | POA: Diagnosis not present

## 2015-10-26 ENCOUNTER — Encounter (HOSPITAL_COMMUNITY): Payer: Self-pay

## 2015-10-26 ENCOUNTER — Emergency Department (HOSPITAL_COMMUNITY)
Admission: EM | Admit: 2015-10-26 | Discharge: 2015-10-26 | Disposition: A | Discharge status: Home / Self Care | Payer: Medicare Other | Attending: Emergency Medicine | Admitting: Emergency Medicine

## 2015-10-26 DIAGNOSIS — Z590 Homelessness: Secondary | ICD-10-CM | POA: Insufficient documentation

## 2015-10-26 DIAGNOSIS — Y998 Other external cause status: Secondary | ICD-10-CM | POA: Diagnosis not present

## 2015-10-26 DIAGNOSIS — X500XXA Overexertion from strenuous movement or load, initial encounter: Secondary | ICD-10-CM | POA: Diagnosis not present

## 2015-10-26 DIAGNOSIS — G8929 Other chronic pain: Secondary | ICD-10-CM | POA: Insufficient documentation

## 2015-10-26 DIAGNOSIS — Y9289 Other specified places as the place of occurrence of the external cause: Secondary | ICD-10-CM | POA: Insufficient documentation

## 2015-10-26 DIAGNOSIS — E119 Type 2 diabetes mellitus without complications: Secondary | ICD-10-CM | POA: Insufficient documentation

## 2015-10-26 DIAGNOSIS — S3992XA Unspecified injury of lower back, initial encounter: Secondary | ICD-10-CM | POA: Diagnosis present

## 2015-10-26 DIAGNOSIS — F319 Bipolar disorder, unspecified: Secondary | ICD-10-CM | POA: Diagnosis not present

## 2015-10-26 DIAGNOSIS — Z794 Long term (current) use of insulin: Secondary | ICD-10-CM | POA: Diagnosis not present

## 2015-10-26 DIAGNOSIS — Z79899 Other long term (current) drug therapy: Secondary | ICD-10-CM | POA: Diagnosis not present

## 2015-10-26 DIAGNOSIS — Y9389 Activity, other specified: Secondary | ICD-10-CM | POA: Insufficient documentation

## 2015-10-26 DIAGNOSIS — F17210 Nicotine dependence, cigarettes, uncomplicated: Secondary | ICD-10-CM | POA: Diagnosis not present

## 2015-10-26 DIAGNOSIS — M5441 Lumbago with sciatica, right side: Secondary | ICD-10-CM | POA: Diagnosis not present

## 2015-10-26 DIAGNOSIS — I10 Essential (primary) hypertension: Secondary | ICD-10-CM | POA: Insufficient documentation

## 2015-10-26 DIAGNOSIS — Z8619 Personal history of other infectious and parasitic diseases: Secondary | ICD-10-CM | POA: Insufficient documentation

## 2015-10-26 MED ORDER — TRAMADOL HCL 50 MG PO TABS
50.0000 mg | ORAL_TABLET | Freq: Once | ORAL | Status: AC
  Administered 2015-10-26: 50 mg via ORAL
  Filled 2015-10-26: qty 1

## 2015-10-26 MED ORDER — TRAMADOL HCL 50 MG PO TABS
50.0000 mg | ORAL_TABLET | Freq: Four times a day (QID) | ORAL | Status: DC | PRN
Start: 2015-10-26 — End: 2016-05-15

## 2015-10-26 NOTE — ED Notes (Signed)
Pt presents with 3 day h/o low back pain.  Pt reports he was lifting trash and mopping the day before, reports pain radiates down R leg.  Pt denies any bowel or bladder incontinence.

## 2015-10-26 NOTE — ED Provider Notes (Signed)
CSN: [id number]     Arrival date & time 10/26/15  1019 History  By signing my name below, I, Essence Howell, attest that this documentation has been prepared under the direction and in the presence of Arnoldo Hooker, PA-C Electronically Signed: Charline Bills, ED Scribe 10/26/2015 at 11:25 AM.   Chief Complaint  Patient presents with  . Back Pain   The history is provided by the patient. No language interpreter was used.   HPI Comments: Billy [name] is a 55 y.o. male, with a h/o chronic back pain, who presents to the Emergency Department complaining of acute on chronic low back pain onset 3 days ago. Pt states that he was lifting trash and mopping 3 days ago when he injured his low back. He reports low back pain that radiates into his right posterior leg and is exacerbated with movement. Pt takes 800 mg ibuprofen x1 daily and Zanaflex x3 daily for chronic back pain. He reports h/o 5 previous back surgeries. Pt denies abdominal pain or bladder/bowel incontinence.   Past Medical History  Diagnosis Date  . Diabetes mellitus   . Hepatitis C carrier   . WPW (Wolff-Parkinson-White syndrome)   . Depression   . Chronic pain   . Suicide attempt (HCC)   . Diabetes mellitus without complication (HCC)   . Hypertension   . Bipolar 1 disorder (HCC)   . HCV (hepatitis C virus)   . Homeless    Past Surgical History  Procedure Laterality Date  . Rhinoplasty    . Chest surgery    . Back surgery    . Tonsillectomy     No family history on file. Social History  Substance Use Topics  . Smoking status: Current Every Day Smoker -- 0.50 packs/day for 8 years    Types: Cigarettes  . Smokeless tobacco: None  . Alcohol Use: Yes     Comment: last drink 24 days    Review of Systems  Gastrointestinal: Negative for abdominal pain.  Musculoskeletal: Positive for back pain.   Allergies  Review of patient's allergies indicates no known allergies.  Home Medications   Prior to Admission  medications   Medication Sig Start Date End Date Taking? Authorizing Provider  albuterol (PROAIR HFA) 108 (90 Base) MCG/ACT inhaler Inhale 2 puffs into the lungs every 6 (six) hours as needed for shortness of breath. 08/15/15   Sanjuana Kava, NP  ARIPiprazole (ABILIFY) 15 MG tablet Take 0.5 tablets (7.5 mg total) by mouth 2 (two) times daily. For mood control 08/15/15   Sanjuana Kava, NP  citalopram (CELEXA) 20 MG tablet Take 1 tablet (20 mg total) by mouth daily. For depression 08/15/15   Sanjuana Kava, NP  gabapentin (NEURONTIN) 400 MG capsule Take 3 capsules (1,200 mg total) by mouth 3 (three) times daily. For agitation 08/15/15   Sanjuana Kava, NP  hydrOXYzine (ATARAX/VISTARIL) 50 MG tablet Take 1 tablet (50 mg total) by mouth at bedtime and may repeat dose one time if needed. For anxiety/insomnia 08/15/15   Sanjuana Kava, NP  insulin glargine (LANTUS) 100 UNIT/ML injection Inject 0.6 mLs (60 Units total) into the skin at bedtime. For diabetes managment 08/15/15   Sanjuana Kava, NP  lisinopril (PRINIVIL,ZESTRIL) 20 MG tablet Take 1 tablet (20 mg total) by mouth daily. For high blood pressure 08/15/15   Sanjuana Kava, NP  nicotine (NICODERM CQ - DOSED IN MG/24 HOURS) 21 mg/24hr patch Place 1 patch (21 mg total) onto the skin  daily. For nicotine addiction 08/15/15   Sanjuana KavaAgnes I Nwoko, NP  omeprazole (PRILOSEC) 20 MG capsule Take 2 capsules (40 mg total) by mouth daily. For acid reflux 08/15/15   Sanjuana KavaAgnes I Nwoko, NP  oxymetazoline (AFRIN) 0.05 % nasal spray Place 1 spray into both nostrils 2 (two) times daily as needed for congestion. 08/15/15   Sanjuana KavaAgnes I Nwoko, NP  pravastatin (PRAVACHOL) 10 MG tablet Take 1 tablet (10 mg total) by mouth at bedtime. For high cholesterol 08/15/15   Sanjuana KavaAgnes I Nwoko, NP  tiotropium (SPIRIVA) 18 MCG inhalation capsule Place 1 capsule (18 mcg total) into inhaler and inhale daily. For COPD 08/15/15   Sanjuana KavaAgnes I Nwoko, NP  tiZANidine (ZANAFLEX) 2 MG tablet Take 2 tablets (4 mg total) by mouth 3  (three) times daily. For pain 08/15/15   Sanjuana KavaAgnes I Nwoko, NP  traZODone (DESYREL) 300 MG tablet Take 1 tablet (300 mg total) by mouth at bedtime. For sleep 08/15/15   Sanjuana KavaAgnes I Nwoko, NP   BP 114/79 mmHg  Pulse 44  Temp(Src) 97.5 F (36.4 C) (Oral)  Resp 18  Ht 6' (1.829 m)  Wt 220 lb (99.791 kg)  BMI 29.83 kg/m2  SpO2 100% Physical Exam  Constitutional: He is oriented to person, place, and time. He appears well-developed and well-nourished. No distress.  HENT:  Head: Normocephalic and atraumatic.  Eyes: Conjunctivae are normal. Pupils are equal, round, and reactive to light.  Neck: Neck supple.  Cardiovascular: Normal rate.   Pulmonary/Chest: Effort normal.  Musculoskeletal: Normal range of motion.  Well-healed midline lumbar surgical scar. No swelling, discoloration or focal tenderness. Full ROM of bilateral LE. Equal reflexes of LE. Distal pulses intact. Strength of LE without deficits.   Neurological: He is alert and oriented to person, place, and time.  Skin: Skin is warm and dry.  Psychiatric: He has a normal mood and affect. His behavior is normal.  Nursing note and vitals reviewed.  ED Course  Procedures (including critical care time) DIAGNOSTIC STUDIES: Oxygen Saturation is 100% on RA, normal by my interpretation.    COORDINATION OF CARE: 10:48 AM-Discussed treatment plan which includes Ultram with pt at bedside and pt agreed to plan.   Labs Review Labs Reviewed - No data to display  Imaging Review No results found.   EKG Interpretation None      MDM   Final diagnoses:  None    1. Recurrent low back pain  Patient presents with recurrent familiar back pain without neurologic red flags. Will treat supportively.   I personally performed the services described in this documentation, which was scribed in my presence. The recorded information has been reviewed and is accurate.     Elpidio AnisShari Deshane Cotroneo, PA-C 10/26/15 1413  Benjiman CoreNathan Pickering, MD 10/26/15 [phone]

## 2015-10-26 NOTE — Discharge Instructions (Signed)

## 2016-05-15 ENCOUNTER — Encounter (HOSPITAL_COMMUNITY): Payer: Self-pay | Admitting: Emergency Medicine

## 2016-05-15 ENCOUNTER — Emergency Department (HOSPITAL_COMMUNITY)
Admission: EM | Admit: 2016-05-15 | Discharge: 2016-05-16 | Disposition: A | Discharge status: Other Acute Inpatient Hospital | Payer: Medicare Other | Attending: Emergency Medicine | Admitting: Emergency Medicine

## 2016-05-15 DIAGNOSIS — Z8679 Personal history of other diseases of the circulatory system: Secondary | ICD-10-CM | POA: Diagnosis not present

## 2016-05-15 DIAGNOSIS — F17210 Nicotine dependence, cigarettes, uncomplicated: Secondary | ICD-10-CM | POA: Insufficient documentation

## 2016-05-15 DIAGNOSIS — Z79899 Other long term (current) drug therapy: Secondary | ICD-10-CM | POA: Insufficient documentation

## 2016-05-15 DIAGNOSIS — E119 Type 2 diabetes mellitus without complications: Secondary | ICD-10-CM | POA: Insufficient documentation

## 2016-05-15 DIAGNOSIS — Z794 Long term (current) use of insulin: Secondary | ICD-10-CM | POA: Insufficient documentation

## 2016-05-15 DIAGNOSIS — R45851 Suicidal ideations: Secondary | ICD-10-CM | POA: Diagnosis not present

## 2016-05-15 DIAGNOSIS — I959 Hypotension, unspecified: Secondary | ICD-10-CM

## 2016-05-15 LAB — SALICYLATE LEVEL

## 2016-05-15 LAB — COMPREHENSIVE METABOLIC PANEL
ALT: 15 U/L — ABNORMAL LOW (ref 17–63)
ANION GAP: 11 (ref 5–15)
AST: 17 U/L (ref 15–41)
Albumin: 4.9 g/dL (ref 3.5–5.0)
Alkaline Phosphatase: 62 U/L (ref 38–126)
BUN: 11 mg/dL (ref 6–20)
CHLORIDE: 101 mmol/L (ref 101–111)
CO2: 23 mmol/L (ref 22–32)
Calcium: 9.5 mg/dL (ref 8.9–10.3)
Creatinine, Ser: 1.33 mg/dL — ABNORMAL HIGH (ref 0.61–1.24)
GFR, EST NON AFRICAN AMERICAN: 59 mL/min — AB (ref >60)
Glucose, Bld: 247 mg/dL — ABNORMAL HIGH (ref 65–99)
POTASSIUM: 3.6 mmol/L (ref 3.5–5.1)
Sodium: 135 mmol/L (ref 135–145)
TOTAL PROTEIN: 8.3 g/dL — AB (ref 6.5–8.1)
Total Bilirubin: 0.6 mg/dL (ref 0.3–1.2)

## 2016-05-15 LAB — CBC
HCT: 50.2 % (ref 39.0–52.0)
Hemoglobin: 18.4 g/dL — ABNORMAL HIGH (ref 13.0–17.0)
MCH: 31.2 pg (ref 26.0–34.0)
MCHC: 36.7 g/dL — ABNORMAL HIGH (ref 30.0–36.0)
MCV: 85.1 fL (ref 78.0–100.0)
PLATELETS: 179 10*3/uL (ref 150–400)
RBC: 5.9 MIL/uL — AB (ref 4.22–5.81)
RDW: 12.3 % (ref 11.5–15.5)
WBC: 7.6 10*3/uL (ref 4.0–10.5)

## 2016-05-15 LAB — ACETAMINOPHEN LEVEL

## 2016-05-15 LAB — RAPID URINE DRUG SCREEN, HOSP PERFORMED
AMPHETAMINES: NOT DETECTED
BENZODIAZEPINES: NOT DETECTED
Barbiturates: NOT DETECTED
Cocaine: POSITIVE — AB
OPIATES: NOT DETECTED
Tetrahydrocannabinol: NOT DETECTED

## 2016-05-15 LAB — ETHANOL: ALCOHOL ETHYL (B): 71 mg/dL — AB (ref <5)

## 2016-05-15 LAB — CBG MONITORING, ED: GLUCOSE-CAPILLARY: 279 mg/dL — AB (ref 65–99)

## 2016-05-15 MED ORDER — RIVAROXABAN 20 MG PO TABS
20.0000 mg | ORAL_TABLET | Freq: Every day | ORAL | Status: DC
  Administered 2016-05-15: 20 mg via ORAL
  Filled 2016-05-15: qty 1

## 2016-05-15 MED ORDER — TRAZODONE HCL 100 MG PO TABS
300.0000 mg | ORAL_TABLET | Freq: Every day | ORAL | Status: DC
  Administered 2016-05-15: 300 mg via ORAL
  Filled 2016-05-15: qty 3

## 2016-05-15 MED ORDER — SODIUM CHLORIDE 0.9 % IV BOLUS (SEPSIS)
1000.0000 mL | Freq: Once | INTRAVENOUS | Status: AC
  Administered 2016-05-15: 1000 mL via INTRAVENOUS

## 2016-05-15 MED ORDER — CANAGLIFLOZIN 300 MG PO TABS
300.0000 mg | ORAL_TABLET | Freq: Every day | ORAL | Status: DC
  Filled 2016-05-15: qty 1

## 2016-05-15 MED ORDER — TIZANIDINE HCL 4 MG PO TABS
4.0000 mg | ORAL_TABLET | Freq: Three times a day (TID) | ORAL | Status: DC
  Administered 2016-05-15: 4 mg via ORAL
  Filled 2016-05-15: qty 1

## 2016-05-15 MED ORDER — LISINOPRIL 20 MG PO TABS
20.0000 mg | ORAL_TABLET | Freq: Every day | ORAL | Status: DC
  Filled 2016-05-15: qty 1

## 2016-05-15 MED ORDER — PANTOPRAZOLE SODIUM 40 MG PO TBEC
40.0000 mg | DELAYED_RELEASE_TABLET | Freq: Every day | ORAL | Status: DC
  Administered 2016-05-15: 40 mg via ORAL
  Filled 2016-05-15: qty 1

## 2016-05-15 MED ORDER — INSULIN GLARGINE 100 UNIT/ML ~~LOC~~ SOLN
50.0000 [IU] | Freq: Every day | SUBCUTANEOUS | Status: DC
  Administered 2016-05-15: 50 [IU] via SUBCUTANEOUS
  Filled 2016-05-15: qty 0.5

## 2016-05-15 NOTE — ED Notes (Signed)
Sitter at bedside.

## 2016-05-15 NOTE — BH Assessment (Addendum)
Tele Assessment Note   Billy [name] is an 55 y.o. male.  -Clinician reviewed note by Dr. Jeraldine LootsLockwood.  Patient presents with concern of depression, suicidal ideation.  He acknowledges a history of bipolar disorder.  He states that he has not taken any medication in several months.  Over the past few days, without a clear precipitant he has had increasing depression, despondency, and new suicidal ideation.  Patient said that he had contacted 911 and told them he was feeling suicidal.  The [employer] officer [facility].  Patient said he was planning on "playing in the traffic on [address]."  He says he was also thinking of stabbing himself.  Patient has been feeling this way for the last few days.  Patient denies any recent stressor but says that this time of year makes him more depressed.  Patient has had 5 previous suicide attempts.  Patient denies any HI or A/V hallucinations.  Patient says he does not usually drink.  Clinician pointed out there was ETOH in his system.  Patient said he had one drink earlier.  Patient was positive for cocaine but says it was "a few days ago" and he only uses it once In a month on average.  [facility]  [facility]; April '16; May '14.  Patient reports being non-compliant with all of his medications for the last few days.  -Clinician discussed patient care with Nira ConnJason Berry, FNP.  He recommended inpatient care.  [facility] to services of Dr. Mckinley Jewelates.  Pt can come over after 08:00.  Diagnosis: Bipolar 1 d/o  Past Medical History:  Past Medical History:  Diagnosis Date  . Bipolar 1 disorder (HCC)   . Chronic pain   . Depression   . Diabetes mellitus   . Diabetes mellitus without complication (HCC)   . HCV (hepatitis C virus)   . Hepatitis C carrier (HCC)   . Homeless   . Hypertension   . Suicide attempt   . WPW  (Wolff-Parkinson-White syndrome)     Past Surgical History:  Procedure Laterality Date  . BACK SURGERY    . CHEST SURGERY    . RHINOPLASTY    . TONSILLECTOMY      Family History: History reviewed. No pertinent family history.  Social History:  reports that he has been smoking Cigarettes.  He has a 4.00 pack-year smoking history. He does not have any smokeless tobacco history on file. He reports that he drinks alcohol. He reports that he uses drugs, including Marijuana, "Crack" cocaine, and Cocaine.  Additional Social History:  Alcohol / Drug Use Pain Medications: none Prescriptions: "I'm on a ton of meds, I just have not been taking them."  See PTA medication list.  Trazadone, vistaril are two of his psychiatric meds. Over the Counter: None History of alcohol / drug use?: Yes Substance #1 Name of Substance 1: Cocaine 1 - Age of First Use: 55 years of age 52 - Amount (size/oz): "Not much" 1 - Frequency: Once in a month maybe 1 - Duration: ongoing 1 - Last Use / Amount: A few days ago.  CIWA: CIWA-Ar BP: 136/98 Pulse Rate: (!) 54 Nausea and Vomiting: no nausea and no vomiting Tactile Disturbances: none Tremor: no tremor Auditory Disturbances: not present Paroxysmal Sweats: no sweat visible Visual Disturbances: not present Anxiety: no anxiety, at ease Headache, Fullness in Head:  none present Agitation: normal activity Orientation and Clouding of Sensorium: oriented and can do serial additions CIWA-Ar Total: 0 COWS:    PATIENT STRENGTHS: (choose at least two) Ability for insight Average or above average intelligence Capable of independent living Motivation for treatment/growth  Allergies: No Known Allergies  Home Medications:  (Not in a hospital admission)  OB/GYN Status:  No LMP for male patient.  General Assessment Data [facility] of Assessment: [facility] TTS Assessment: In system Is this a Tele or Face-to-Face Assessment?: Face-to-Face Is this an Initial  Assessment or a Re-assessment for this encounter?: Initial Assessment Marital status: Divorced Is patient pregnant?: No Pregnancy Status: No Living Arrangements: Alone Can pt return to current living arrangement?: Yes Admission Status: Voluntary Is patient capable of signing voluntary admission?: Yes Referral Source: Self/Family/Friend (Called 911 because of SI.) Insurance type: MCD/MCR     Crisis Care Plan Living Arrangements: Alone Name of Psychiatrist: [facility] Name of Therapist: Patton Salles at [facility]  Education Status Is patient currently in school?: No Highest grade of school patient has completed: 12th grade  Risk to self with the past 6 months Suicidal Ideation: Yes-Currently Present Has patient been a risk to self within the past 6 months prior to admission? : No Suicidal Intent: Yes-Currently Present Has patient had any suicidal intent within the past 6 months prior to admission? : No Is patient at risk for suicide?: Yes Suicidal Plan?: Yes-Currently Present Has patient had any suicidal plan within the past 6 months prior to admission? : No Specify Current Suicidal Plan: Get hit by a car Access to Means: Yes Specify Access to Suicidal Means: Traffic What has been your use of drugs/alcohol within the last 12 months?: Cocaine Previous Attempts/Gestures: Yes How many times?: 5 Other Self Harm Risks: Has harmed self in past Triggers for Past Attempts: Unpredictable Intentional Self Injurious Behavior: None Family Suicide History: No Recent stressful life event(s): Other (Comment) (Pt cannot identify specific stressor) Persecutory voices/beliefs?: No Depression: Yes Depression Symptoms: Despondent, Loss of interest in usual pleasures, Feeling worthless/self pity, Fatigue, Isolating Substance abuse history and/or treatment for substance abuse?: Yes Suicide prevention information given to non-admitted patients: Not applicable  Risk to  Others within the past 6 months Homicidal Ideation: No Does patient have any lifetime risk of violence toward others beyond the six months prior to admission? : No Thoughts of Harm to Others: No Current Homicidal Intent: No Current Homicidal Plan: No Access to Homicidal Means: No Identified Victim: No one History of harm to others?: No Assessment of Violence: None Noted Violent Behavior Description: None reported Does patient have access to weapons?: Yes (Comment) (Cooking knives) Criminal Charges Pending?: No Does patient have a court date: No Is patient on probation?: No  Psychosis Hallucinations: None noted Delusions: None noted  Mental Status Report Appearance/Hygiene: Disheveled, In scrubs Eye Contact: Fair Motor Activity: Freedom of movement, Unremarkable Speech: Logical/coherent Level of Consciousness: Alert Mood: Depressed, Sad, Worthless, low self-esteem Affect: Depressed, Sad Anxiety Level: None Thought Processes: Coherent, Relevant Judgement: Unimpaired Orientation: Appropriate for developmental age, Person, Place, Time, Situation Obsessive Compulsive Thoughts/Behaviors: None  Cognitive Functioning Concentration: Normal Memory: Remote Intact, Recent Impaired IQ: Average Insight: Good Impulse Control: Fair Appetite: Poor Weight Loss: 0 Weight Gain: 0 Sleep: No Change Total Hours of Sleep: 12 Vegetative Symptoms: Staying in bed  ADLScreening [facility] Services) Patient's cognitive ability adequate to safely complete daily activities?: Yes Patient able to express need for assistance with ADLs?: Yes Independently performs  ADLs?: Yes (appropriate for developmental age)  Prior Inpatient Therapy Prior Inpatient Therapy: Yes Prior Therapy Dates: 08/2015; 09/2014; 09/2012 [facility] Reason for Treatment: SI  Prior Outpatient Therapy Prior Outpatient Therapy: Yes Prior Therapy Dates: Last 3 years Prior Therapy  [facility] Reason for Treatment: counseling; med management Does patient have an ACCT team?: No Does patient have Intensive In-House Services?  : No Does patient have [facility] services? : No Does patient have P4CC services?: No  ADL Screening (condition at time of admission) Patient's cognitive ability adequate to safely complete daily activities?: Yes Is the patient deaf or have difficulty hearing?: No Does the patient have difficulty seeing, even when wearing glasses/contacts?: No Does the patient have difficulty concentrating, remembering, or making decisions?: No Patient able to express need for assistance with ADLs?: Yes Does the patient have difficulty dressing or bathing?: No Independently performs ADLs?: Yes (appropriate for developmental age) Does the patient have difficulty walking or climbing stairs?: No Weakness of Legs: None Weakness of Arms/Hands: None       Abuse/Neglect Assessment (Assessment to be complete while patient is alone) Physical Abuse: Denies Verbal Abuse: Denies Sexual Abuse: Denies Exploitation of patient/patient's resources: Denies Self-Neglect: Denies     Merchant navy officerAdvance Directives (For Healthcare) Does Patient Have a Medical Advance Directive?: No    Additional Information 1:1 In Past 12 Months?: No CIRT Risk: No Elopement Risk: No Does patient have medical clearance?: Yes     Disposition:  Disposition Initial Assessment Completed for this Encounter: Yes Disposition of Patient: Other dispositions Other disposition(s): Other (Comment) (Pt to be reviewed with NP)  Beatriz StallionHarvey, Billy [name] 05/15/2016 11:51 PM

## 2016-05-15 NOTE — ED Triage Notes (Signed)
Per [employer] pt called police for SI with plan and intent to stab self or else step into traffic. Hypotensive. No lightheadedness, dizziness, SOB, CP.

## 2016-05-15 NOTE — ED Provider Notes (Signed)
[facility]-EMERGENCY DEPT Provider Note   CSN: [id number] Arrival date & time: 05/15/16  1757     History   Chief Complaint Chief Complaint  Patient presents with  . Suicidal  . Hypotension    HPI Billy [name] is a 55 y.o. male.  HPI Patient presents with concern of depression, suicidal ideation. He acknowledges a history of bipolar disorder. He states that he has not taken any medication in several months. Over the past few days, without a clear precipitant he has had increasing depression, despondency, and new suicidal ideation. He notes no hospitalizations in several years. He has ongoing anorexia, but no nausea, no vomiting, no diarrhea. No pain anywhere.  Past Medical History:  Diagnosis Date  . Bipolar 1 disorder (HCC)   . Chronic pain   . Depression   . Diabetes mellitus   . Diabetes mellitus without complication (HCC)   . HCV (hepatitis C virus)   . Hepatitis C carrier (HCC)   . Homeless   . Hypertension   . Suicide attempt   . WPW (Wolff-Parkinson-White syndrome)     Patient Active Problem List   Diagnosis Date Noted  . Affective psychosis, bipolar (HCC)   . Bipolar affective disorder, depressed (HCC) 08/09/2015  . Bipolar disorder with current episode depressed (HCC) 08/09/2015  . Polysubstance abuse   . Type 2 diabetes mellitus with hyperglycemia (HCC) 09/19/2014  . Right leg DVT (HCC) 09/19/2014  . Essential hypertension 09/19/2014  . Bipolar affective disorder, depressed, severe, with psychotic behavior (HCC) 09/14/2014  . Chronic pain syndrome 09/14/2014  . Alcohol dependence with uncomplicated withdrawal (HCC) 09/13/2014  . Cocaine abuse 09/13/2014  . Alcohol abuse 10/12/2012  . Homeless 09/02/2011  . Opiate abuse, episodic 09/02/2011  . Diabetes mellitus 08/30/2011  . WPW (Wolff-Parkinson-White syndrome) 08/30/2011    Past Surgical History:  Procedure Laterality Date  . BACK SURGERY    . CHEST SURGERY    . RHINOPLASTY    .  TONSILLECTOMY         Home Medications    Prior to Admission medications   Medication Sig Start Date End Date Taking? Authorizing Provider  albuterol (PROAIR HFA) 108 (90 Base) MCG/ACT inhaler Inhale 2 puffs into the lungs every 6 (six) hours as needed for shortness of breath. 08/15/15   Sanjuana KavaAgnes I Nwoko, NP  ARIPiprazole (ABILIFY) 15 MG tablet Take 0.5 tablets (7.5 mg total) by mouth 2 (two) times daily. For mood control 08/15/15   Sanjuana KavaAgnes I Nwoko, NP  citalopram (CELEXA) 20 MG tablet Take 1 tablet (20 mg total) by mouth daily. For depression 08/15/15   Sanjuana KavaAgnes I Nwoko, NP  gabapentin (NEURONTIN) 400 MG capsule Take 3 capsules (1,200 mg total) by mouth 3 (three) times daily. For agitation 08/15/15   Sanjuana KavaAgnes I Nwoko, NP  hydrOXYzine (ATARAX/VISTARIL) 50 MG tablet Take 1 tablet (50 mg total) by mouth at bedtime and may repeat dose one time if needed. For anxiety/insomnia 08/15/15   Sanjuana KavaAgnes I Nwoko, NP  insulin glargine (LANTUS) 100 UNIT/ML injection Inject 0.6 mLs (60 Units total) into the skin at bedtime. For diabetes managment 08/15/15   Sanjuana KavaAgnes I Nwoko, NP  lisinopril (PRINIVIL,ZESTRIL) 20 MG tablet Take 1 tablet (20 mg total) by mouth daily. For high blood pressure 08/15/15   Sanjuana KavaAgnes I Nwoko, NP  omeprazole (PRILOSEC) 20 MG capsule Take 2 capsules (40 mg total) by mouth daily. For acid reflux 08/15/15   Sanjuana KavaAgnes I Nwoko, NP  oxymetazoline (AFRIN) 0.05 % nasal spray Place 1 spray  into both nostrils 2 (two) times daily as needed for congestion. 08/15/15   Sanjuana KavaAgnes I Nwoko, NP  tiotropium (SPIRIVA) 18 MCG inhalation capsule Place 1 capsule (18 mcg total) into inhaler and inhale daily. For COPD 08/15/15   Sanjuana KavaAgnes I Nwoko, NP  tiZANidine (ZANAFLEX) 2 MG tablet Take 2 tablets (4 mg total) by mouth 3 (three) times daily. For pain 08/15/15   Sanjuana KavaAgnes I Nwoko, NP  traMADol (ULTRAM) 50 MG tablet Take 1 tablet (50 mg total) by mouth every 6 (six) hours as needed. 10/26/15   Elpidio AnisShari Upstill, PA-C  traZODone (DESYREL) 300 MG tablet Take 1  tablet (300 mg total) by mouth at bedtime. For sleep 08/15/15   Sanjuana KavaAgnes I Nwoko, NP    Family History History reviewed. No pertinent family history.  Social History Social History  Substance Use Topics  . Smoking status: Current Every Day Smoker    Packs/day: 0.50    Years: 8.00    Types: Cigarettes  . Smokeless tobacco: Not on file  . Alcohol use Yes     Comment: last drink 24 days     Allergies   Patient has no known allergies.   Review of Systems Review of Systems  Constitutional:       Per HPI, otherwise negative  HENT:       Per HPI, otherwise negative  Respiratory:       Per HPI, otherwise negative  Cardiovascular:       Per HPI, otherwise negative  Gastrointestinal: Negative for vomiting.  Endocrine:       Negative aside from HPI  Genitourinary:       Neg aside from HPI   Musculoskeletal:       Per HPI, otherwise negative  Skin: Negative.   Neurological: Negative for syncope.  Psychiatric/Behavioral: Positive for dysphoric mood, self-injury, sleep disturbance and suicidal ideas. Negative for hallucinations. The patient is nervous/anxious.      Physical Exam Updated Vital Signs BP 99/79 (BP Location: Left Arm)   Pulse 78   Temp 97.7 F (36.5 C) (Oral)   Resp 16   SpO2 96%   Physical Exam  Constitutional: He is oriented to person, place, and time. He appears well-developed. No distress.  HENT:  Head: Normocephalic and atraumatic.  Eyes: Conjunctivae and EOM are normal.  Cardiovascular: Normal rate and regular rhythm.   Pulmonary/Chest: Effort normal. No stridor. No respiratory distress.  Abdominal: He exhibits no distension.  Musculoskeletal: He exhibits no edema.  Neurological: He is alert and oriented to person, place, and time.  Skin: Skin is warm and dry.  Psychiatric: He is slowed. Cognition and memory are not impaired. He does not express impulsivity. He exhibits a depressed mood. He expresses suicidal ideation. He expresses no suicidal plans.   Nursing note and vitals reviewed.    ED Treatments / Results  Labs (all labs ordered are listed, but only abnormal results are displayed) Labs Reviewed  COMPREHENSIVE METABOLIC PANEL - Abnormal; Notable for the following:       Result Value   Glucose, Bld 247 (*)    Creatinine, Ser 1.33 (*)    Total Protein 8.3 (*)    ALT 15 (*)    GFR calc non Af Amer 59 (*)    All other components within normal limits  ETHANOL - Abnormal; Notable for the following:    Alcohol, Ethyl (B) 71 (*)    All other components within normal limits  ACETAMINOPHEN LEVEL - Abnormal; Notable for the following:  Acetaminophen (Tylenol), Serum <10 (*)    All other components within normal limits  CBC - Abnormal; Notable for the following:    RBC 5.90 (*)    Hemoglobin 18.4 (*)    MCHC 36.7 (*)    All other components within normal limits  SALICYLATE LEVEL  RAPID URINE DRUG SCREEN, HOSP PERFORMED    Procedures Procedures (including critical care time)  Medications Ordered in ED Medications  sodium chloride 0.9 % bolus 1,000 mL (not administered)  sodium chloride 0.9 % bolus 1,000 mL (1,000 mLs Intravenous New Bag/Given 05/15/16 1839)   Patient was hypotensive on arrival, and after 1 L fluid resuscitation has blood pressure 95/70  Initial Impression / Assessment and Plan / ED Course  I have reviewed the triage vital signs and the nursing notes.  Pertinent labs & imaging results that were available during my care of the patient were reviewed by me and considered in my medical decision making (see chart for details).  Clinical Course     8:44 PM Following 2 L fluid resuscitation the patient's blood pressure has normalized. Given his acknowledgment of anorexia, there suspicion for dehydration is contributory, and the patient has elevated creatinine, further supporting. He remains in no distress, and is now medically cleared for psychiatric evaluation.   Final Clinical Impressions(s) / ED  Diagnoses  Suicidal ideation Hypertension    Gerhard Munch, MD 05/15/16 2045

## 2016-05-16 ENCOUNTER — Inpatient Hospital Stay (HOSPITAL_COMMUNITY)
Admission: AD | Admit: 2016-05-16 | Discharge: 2016-05-22 | DRG: 885 | Disposition: A | Discharge status: Home / Self Care | Payer: Medicare Other | Source: Intra-hospital | Attending: Psychiatry | Admitting: Psychiatry

## 2016-05-16 ENCOUNTER — Encounter (HOSPITAL_COMMUNITY): Payer: Self-pay | Admitting: *Deleted

## 2016-05-16 DIAGNOSIS — I959 Hypotension, unspecified: Secondary | ICD-10-CM | POA: Diagnosis not present

## 2016-05-16 DIAGNOSIS — R45851 Suicidal ideations: Secondary | ICD-10-CM | POA: Diagnosis present

## 2016-05-16 DIAGNOSIS — Z9114 Patient's other noncompliance with medication regimen: Secondary | ICD-10-CM

## 2016-05-16 DIAGNOSIS — E119 Type 2 diabetes mellitus without complications: Secondary | ICD-10-CM | POA: Diagnosis present

## 2016-05-16 DIAGNOSIS — B1920 Unspecified viral hepatitis C without hepatic coma: Secondary | ICD-10-CM | POA: Diagnosis present

## 2016-05-16 DIAGNOSIS — F1420 Cocaine dependence, uncomplicated: Secondary | ICD-10-CM | POA: Diagnosis not present

## 2016-05-16 DIAGNOSIS — G894 Chronic pain syndrome: Secondary | ICD-10-CM | POA: Diagnosis present

## 2016-05-16 DIAGNOSIS — G4700 Insomnia, unspecified: Secondary | ICD-10-CM | POA: Diagnosis present

## 2016-05-16 DIAGNOSIS — F1910 Other psychoactive substance abuse, uncomplicated: Secondary | ICD-10-CM | POA: Diagnosis present

## 2016-05-16 DIAGNOSIS — Z7901 Long term (current) use of anticoagulants: Secondary | ICD-10-CM

## 2016-05-16 DIAGNOSIS — Z9889 Other specified postprocedural states: Secondary | ICD-10-CM | POA: Diagnosis not present

## 2016-05-16 DIAGNOSIS — Z794 Long term (current) use of insulin: Secondary | ICD-10-CM | POA: Diagnosis not present

## 2016-05-16 DIAGNOSIS — F1020 Alcohol dependence, uncomplicated: Secondary | ICD-10-CM | POA: Diagnosis present

## 2016-05-16 DIAGNOSIS — I456 Pre-excitation syndrome: Secondary | ICD-10-CM | POA: Diagnosis present

## 2016-05-16 DIAGNOSIS — F419 Anxiety disorder, unspecified: Secondary | ICD-10-CM | POA: Diagnosis present

## 2016-05-16 DIAGNOSIS — I10 Essential (primary) hypertension: Secondary | ICD-10-CM | POA: Diagnosis present

## 2016-05-16 DIAGNOSIS — Z79899 Other long term (current) drug therapy: Secondary | ICD-10-CM | POA: Diagnosis not present

## 2016-05-16 DIAGNOSIS — F3163 Bipolar disorder, current episode mixed, severe, without psychotic features: Secondary | ICD-10-CM | POA: Diagnosis present

## 2016-05-16 DIAGNOSIS — Z86718 Personal history of other venous thrombosis and embolism: Secondary | ICD-10-CM

## 2016-05-16 DIAGNOSIS — Z590 Homelessness: Secondary | ICD-10-CM | POA: Diagnosis not present

## 2016-05-16 DIAGNOSIS — F17210 Nicotine dependence, cigarettes, uncomplicated: Secondary | ICD-10-CM | POA: Diagnosis present

## 2016-05-16 DIAGNOSIS — Z915 Personal history of self-harm: Secondary | ICD-10-CM

## 2016-05-16 DIAGNOSIS — E1165 Type 2 diabetes mellitus with hyperglycemia: Secondary | ICD-10-CM | POA: Diagnosis present

## 2016-05-16 LAB — GLUCOSE, CAPILLARY
GLUCOSE-CAPILLARY: 188 mg/dL — AB (ref 65–99)
Glucose-Capillary: 184 mg/dL — ABNORMAL HIGH (ref 65–99)
Glucose-Capillary: 234 mg/dL — ABNORMAL HIGH (ref 65–99)

## 2016-05-16 MED ORDER — ARIPIPRAZOLE 15 MG PO TABS
7.5000 mg | ORAL_TABLET | Freq: Two times a day (BID) | ORAL | Status: DC
  Administered 2016-05-16 – 2016-05-22 (×12): 7.5 mg via ORAL
  Filled 2016-05-16 (×16): qty 1

## 2016-05-16 MED ORDER — ALUM & MAG HYDROXIDE-SIMETH 200-200-20 MG/5ML PO SUSP: 30.0000 mL | ORAL | Status: DC | PRN

## 2016-05-16 MED ORDER — INSULIN ASPART 100 UNIT/ML ~~LOC~~ SOLN
0.0000 [IU] | Freq: Three times a day (TID) | SUBCUTANEOUS | Status: DC
  Administered 2016-05-17: 7 [IU] via SUBCUTANEOUS
  Administered 2016-05-17: 5 [IU] via SUBCUTANEOUS
  Administered 2016-05-17 – 2016-05-18 (×2): 3 [IU] via SUBCUTANEOUS
  Administered 2016-05-18: 9 [IU] via SUBCUTANEOUS
  Administered 2016-05-18 – 2016-05-19 (×2): 3 [IU] via SUBCUTANEOUS
  Administered 2016-05-19: 8 [IU] via SUBCUTANEOUS
  Administered 2016-05-20 – 2016-05-21 (×4): 3 [IU] via SUBCUTANEOUS
  Administered 2016-05-21 (×2): 5 [IU] via SUBCUTANEOUS
  Administered 2016-05-22: 3 [IU] via SUBCUTANEOUS

## 2016-05-16 MED ORDER — HYDROXYZINE HCL 50 MG PO TABS
50.0000 mg | ORAL_TABLET | Freq: Every evening | ORAL | Status: DC | PRN
  Administered 2016-05-16 – 2016-05-21 (×10): 50 mg via ORAL
  Filled 2016-05-16 (×19): qty 1

## 2016-05-16 MED ORDER — LISINOPRIL 20 MG PO TABS
20.0000 mg | ORAL_TABLET | Freq: Every day | ORAL | Status: DC
  Administered 2016-05-16 – 2016-05-22 (×7): 20 mg via ORAL
  Filled 2016-05-16 (×11): qty 1

## 2016-05-16 MED ORDER — INSULIN GLARGINE 100 UNIT/ML ~~LOC~~ SOLN
60.0000 [IU] | Freq: Every day | SUBCUTANEOUS | Status: DC
  Administered 2016-05-16 – 2016-05-21 (×6): 60 [IU] via SUBCUTANEOUS

## 2016-05-16 MED ORDER — TIOTROPIUM BROMIDE MONOHYDRATE 18 MCG IN CAPS
18.0000 ug | ORAL_CAPSULE | Freq: Every day | RESPIRATORY_TRACT | Status: DC
  Administered 2016-05-16 – 2016-05-22 (×7): 18 ug via RESPIRATORY_TRACT
  Filled 2016-05-16: qty 5

## 2016-05-16 MED ORDER — OXYMETAZOLINE HCL 0.05 % NA SOLN
1.0000 | Freq: Two times a day (BID) | NASAL | Status: DC | PRN
  Administered 2016-05-17 – 2016-05-21 (×4): 1 via NASAL
  Filled 2016-05-16: qty 15

## 2016-05-16 MED ORDER — CITALOPRAM HYDROBROMIDE 20 MG PO TABS
20.0000 mg | ORAL_TABLET | Freq: Every day | ORAL | Status: DC
  Administered 2016-05-16 – 2016-05-22 (×7): 20 mg via ORAL
  Filled 2016-05-16 (×10): qty 1

## 2016-05-16 MED ORDER — CANAGLIFLOZIN 300 MG PO TABS
300.0000 mg | ORAL_TABLET | Freq: Every day | ORAL | Status: DC
  Administered 2016-05-16 – 2016-05-22 (×7): 300 mg via ORAL
  Filled 2016-05-16 (×9): qty 1

## 2016-05-16 MED ORDER — RIVAROXABAN 20 MG PO TABS
20.0000 mg | ORAL_TABLET | Freq: Every day | ORAL | Status: DC
  Administered 2016-05-16 – 2016-05-22 (×7): 20 mg via ORAL
  Filled 2016-05-16 (×9): qty 1

## 2016-05-16 MED ORDER — NICOTINE 21 MG/24HR TD PT24
21.0000 mg | MEDICATED_PATCH | Freq: Every day | TRANSDERMAL | Status: DC
  Administered 2016-05-16 – 2016-05-22 (×7): 21 mg via TRANSDERMAL
  Filled 2016-05-16 (×9): qty 1

## 2016-05-16 MED ORDER — ALBUTEROL SULFATE HFA 108 (90 BASE) MCG/ACT IN AERS
2.0000 | INHALATION_SPRAY | Freq: Four times a day (QID) | RESPIRATORY_TRACT | Status: DC | PRN
  Administered 2016-05-17: 2 via RESPIRATORY_TRACT
  Filled 2016-05-16: qty 6.7

## 2016-05-16 MED ORDER — PANTOPRAZOLE SODIUM 40 MG PO TBEC
40.0000 mg | DELAYED_RELEASE_TABLET | Freq: Every day | ORAL | Status: DC
  Administered 2016-05-16 – 2016-05-22 (×7): 40 mg via ORAL
  Filled 2016-05-16 (×9): qty 1

## 2016-05-16 MED ORDER — TRAZODONE HCL 100 MG PO TABS
100.0000 mg | ORAL_TABLET | Freq: Every day | ORAL | Status: DC
  Administered 2016-05-16 – 2016-05-17 (×2): 100 mg via ORAL
  Filled 2016-05-16 (×4): qty 1

## 2016-05-16 MED ORDER — ACETAMINOPHEN 325 MG PO TABS
650.0000 mg | ORAL_TABLET | Freq: Four times a day (QID) | ORAL | Status: DC | PRN
  Administered 2016-05-17: 650 mg via ORAL
  Filled 2016-05-16: qty 2

## 2016-05-16 MED ORDER — TIZANIDINE HCL 2 MG PO TABS
4.0000 mg | ORAL_TABLET | Freq: Three times a day (TID) | ORAL | Status: DC
  Administered 2016-05-16 – 2016-05-17 (×4): 4 mg via ORAL
  Filled 2016-05-16 (×9): qty 1

## 2016-05-16 MED ORDER — MAGNESIUM HYDROXIDE 400 MG/5ML PO SUSP: 30.0000 mL | Freq: Every day | ORAL | Status: DC | PRN

## 2016-05-16 MED ORDER — INSULIN ASPART 100 UNIT/ML ~~LOC~~ SOLN
4.0000 [IU] | Freq: Three times a day (TID) | SUBCUTANEOUS | Status: DC
  Administered 2016-05-17 – 2016-05-22 (×15): 4 [IU] via SUBCUTANEOUS

## 2016-05-16 MED ORDER — GABAPENTIN 600 MG PO TABS
1200.0000 mg | ORAL_TABLET | Freq: Three times a day (TID) | ORAL | Status: DC
  Administered 2016-05-16 – 2016-05-22 (×17): 1200 mg via ORAL
  Filled 2016-05-16 (×25): qty 2

## 2016-05-16 NOTE — BH Assessment (Signed)
[facility] Assessment Progress Note  Per morning shift report, Nira ConnJason Berry, FNP, has determined that this pt requires psychiatric hospitalization at this time.  [facility].  Pt has signed Voluntary Admission and Consent for Treatment, as well as Consent to Release Information to Pat[facility]Anita Jones [facility], and a notification call has been placed.  Signed [facility].  Pt's nurse has been notified, and agrees to send original paperwork along with pt via [place], and to call report to [phone].  Doylene Canninghomas Terrel Nesheiwat, MA Triage Specialist [phone]

## 2016-05-16 NOTE — H&P (Signed)
Psychiatric Admission Assessment Adult  Patient Identification: Billy [name]  MRN:  [id number]  Date of Evaluation:  05/16/2016  Chief Complaint: Worsening symptoms of depression triggering suicidal ideations.  Principal Diagnosis: Bipolar affective disorder, current episodes, depressed.  Diagnosis:   Patient Active Problem List   Diagnosis Date Noted  . Alcohol use disorder, severe, dependence (HCC) [F10.20] 05/16/2016  . Affective psychosis, bipolar (HCC) [F31.9]   . Bipolar affective disorder, depressed (HCC) [F31.30] 08/09/2015  . Bipolar disorder with current episode depressed (HCC) [F31.30] 08/09/2015  . Polysubstance abuse [F19.10]   . Type 2 diabetes mellitus with hyperglycemia (HCC) [E11.65] 09/19/2014  . Right leg DVT (HCC) [I82.401] 09/19/2014  . Essential hypertension [I10] 09/19/2014  . Bipolar affective disorder, depressed, severe, with psychotic behavior (HCC) [F31.5] 09/14/2014  . Chronic pain syndrome [G89.4] 09/14/2014  . Alcohol dependence with uncomplicated withdrawal (HCC) [F10.230] 09/13/2014  . Cocaine abuse [F14.10] 09/13/2014  . Alcohol abuse [F10.10] 10/12/2012  . Homeless [Z59.0] 09/02/2011  . Opiate abuse, episodic [F11.10] 09/02/2011  . Diabetes mellitus [250] 08/30/2011  . WPW (Wolff-Parkinson-White syndrome) [I45.6] 08/30/2011   History of Present Illness: This is one of the numerous admission assessment for this 55 year old Caucasian male with hx of chronic mental illness & substance dependency. [facility] adult unit this time with complaints of suicidal ideations with plans to hurt himself some how. He reported during this admission assessment that he was compliant with his medication regimen until a month ago when he decided to stop them because he was feeling great. He stated the he was okay until 4 days ago when he decided to try cocaine again. It was after using the cocaine that he became very depressed & suicidal. He states that he  called the cops, who came & took him to the ED. Billy [name] is requesting to re-start his Abilify, Trazodone, Vistaril, Gabapentin & Citalopram as he believed these medications were helpful to him until he stopped taking them. His UDS was positive for Cocaine & BAL at 71 per toxicology reports. He is currently not presenting with any substance withdrawal symptoms. His current medications for his pre-existing medical issues resumed as well. Will obtain Lipid panels & HGBA1C. He currently denies any hallucinations, delusional thoughts or paranoia.  Associated Signs/Symptoms:  Depression Symptoms:  depressed mood, insomnia, feelings of worthlessness/guilt, suicidal thoughts with specific plan, anxiety,  (Hypo) Manic Symptoms:  Impulsivity, Irritable Mood, Labiality of Mood,  Anxiety Symptoms:  Excessive Worry,  Psychotic Symptoms:  Denies any hallucinations, delusional thoughts or paranoia.  PTSD Symptoms: Negative  Total Time spent with patient: 1 hour  Past Psychiatric History:   Is the patient at risk to self? Yes.    Has the patient been a risk to self in the past 6 months? Yes.    Has the patient been a risk to self within the distant past? Yes.    Is the patient a risk to others? No.  Has the patient been a risk to others in the past 6 months? No.  Has the patient been a risk to others within the distant past? No.  Prior Inpatient Therapy:  [facility]  Prior Outpatient Therapy:  [facility]. Celexa Trazodone [facility]  Alcohol Screening: 1. How often do you have a drink containing alcohol?: Never 9. Have you or someone else been injured as a result of your drinking?: No 10. Has a relative or friend or a doctor or another health worker been concerned about your drinking or suggested  you cut down?: No Alcohol Use Disorder Identification Test Final Score (AUDIT): 0 Brief Intervention: Patient declined brief intervention  Substance Abuse History in the last 12 months:  Yes.     Consequences of Substance Abuse: Medical Consequences:  Liver damage, Possible death by overdose Legal Consequences:  Arrests, jail time, Loss of driving privilege. Family Consequences:  Family discord, divorce and or separation.  Previous Psychotropic Medications: Yes   Psychological Evaluations: No   Past Medical History:  Past Medical History:  Diagnosis Date  . Bipolar 1 disorder (HCC)   . Chronic pain   . Depression   . Diabetes mellitus   . Diabetes mellitus without complication (HCC)   . HCV (hepatitis C virus)   . Hepatitis C carrier (HCC)   . Homeless   . Hypertension   . Suicide attempt   . WPW (Wolff-Parkinson-White syndrome)     Past Surgical History:  Procedure Laterality Date  . BACK SURGERY    . CHEST SURGERY    . RHINOPLASTY    . TONSILLECTOMY     Family History: History reviewed. No pertinent family history.  Family Psychiatric  History: Denies family history  Tobacco Screening: Smokes a pack of cigarette daily.  Social History:  History  Alcohol Use  . Yes    Comment: last drink 24 days     History  Drug Use  . 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  Blood Alcohol level:  Lab Results  Component Value Date   ETH 71 (H) 05/15/2016   ETH <5 08/08/2015   Metabolic Disorder Labs:  Lab Results  Component Value Date   HGBA1C 6.7 (H) 09/14/2014   MPG 146 09/14/2014   MPG 126 (H) 10/12/2012   No results found for: PROLACTIN No results found for: CHOL, TRIG, HDL, CHOLHDL, VLDL, LDLCALC  Current Medications: Current Facility-Administered Medications  Medication Dose Route Frequency Provider Last Rate Last Dose  . acetaminophen (TYLENOL) tablet 650 mg   650 mg Oral Q6H PRN Sanjuana KavaAgnes I Jeanelle Dake, NP      . albuterol (PROVENTIL HFA;VENTOLIN HFA) 108 (90 Base) MCG/ACT inhaler 2 puff  2 puff Inhalation Q6H PRN Sanjuana KavaAgnes I Avigail Pilling, NP      . alum & mag hydroxide-simeth (MAALOX/MYLANTA) 200-200-20 MG/5ML suspension 30 mL  30 mL Oral Q4H PRN Sanjuana KavaAgnes I Gee Habig, NP      . canagliflozin [facility](INVOKANA) tablet 300 mg  300 mg Oral Daily Sanjuana KavaAgnes I Dorotea Hand, NP      . gabapentin (NEURONTIN) tablet 1,200 mg  1,200 mg Oral TID Sanjuana KavaAgnes I Montez Stryker, NP      . insulin aspart (novoLOG) injection 0-15 Units  0-15 Units Subcutaneous TID WC Sanjuana KavaAgnes I Shai Mckenzie, NP      . insulin aspart (novoLOG) injection 4 Units  4 Units Subcutaneous TID WC Sanjuana KavaAgnes I Shayaan Parke, NP      . insulin glargine (LANTUS) injection 60 Units  60 Units Subcutaneous QHS Sanjuana KavaAgnes I Ephrem Carrick, NP      . lisinopril (PRINIVIL,ZESTRIL) tablet 20 mg  20 mg Oral Daily Sanjuana KavaAgnes I Knox Cervi, NP      . magnesium hydroxide (MILK OF MAGNESIA) suspension 30 mL  30 mL Oral Daily PRN Nicole KindredAgnes  I Lanise Mergen, NP      . nicotine (NICODERM CQ - dosed in mg/24 hours) patch 21 mg  21 mg Transdermal Q0600 Sanjuana Kava, NP      . oxymetazoline (AFRIN) 0.05 % nasal spray 1 spray  1 spray Each Nare BID PRN Sanjuana Kava, NP      . pantoprazole (PROTONIX) EC tablet 40 mg  40 mg Oral Daily Sanjuana Kava, NP      . rivaroxaban (XARELTO) tablet 20 mg  20 mg Oral Daily Sanjuana Kava, NP      . tiotropium (SPIRIVA) inhalation capsule 18 mcg  18 mcg Inhalation Daily Sanjuana Kava, NP      . tiZANidine (ZANAFLEX) tablet 4 mg  4 mg Oral TID Sanjuana Kava, NP      . traZODone (DESYREL) tablet 100 mg  100 mg Oral QHS Sanjuana Kava, NP       PTA Medications: Prescriptions Prior to Admission  Medication Sig Dispense Refill Last Dose  . albuterol (PROAIR HFA) 108 (90 Base) MCG/ACT inhaler Inhale 2 puffs into the lungs every 6 (six) hours as needed for shortness of breath. 1 Inhaler 0 Past Month at Unknown time  . ARIPiprazole (ABILIFY) 15 MG tablet Take 0.5 tablets (7.5 mg total) by mouth 2 (two)  times daily. For mood control 30 tablet 0 Past Month at Unknown time  . citalopram (CELEXA) 20 MG tablet Take 1 tablet (20 mg total) by mouth daily. For depression 30 tablet 0 Past Month at Unknown time  . gabapentin (NEURONTIN) 400 MG capsule Take 3 capsules (1,200 mg total) by mouth 3 (three) times daily. For agitation 270 capsule 0 Past Month at Unknown time  . hydrOXYzine (ATARAX/VISTARIL) 50 MG tablet Take 1 tablet (50 mg total) by mouth at bedtime and may repeat dose one time if needed. For anxiety/insomnia 60 tablet 0 Past Month at Unknown time  . insulin glargine (LANTUS) 100 UNIT/ML injection Inject 0.6 mLs (60 Units total) into the skin at bedtime. For diabetes managment (Patient taking differently: Inject 50 Units into the skin at bedtime. For diabetes managment) 10 mL 0 Past Week at Unknown time  . INVOKANA 300 MG TABS tablet Take 300 mg by mouth daily.  2 Past Month at Unknown time  . lisinopril (PRINIVIL,ZESTRIL) 20 MG tablet Take 1 tablet (20 mg total) by mouth daily. For high blood pressure 30 tablet 0 Past Month at Unknown time  . omeprazole (PRILOSEC) 20 MG capsule Take 2 capsules (40 mg total) by mouth daily. For acid reflux   05/14/2016 at Unknown time  . oxymetazoline (AFRIN) 0.05 % nasal spray Place 1 spray into both nostrils 2 (two) times daily as needed for congestion. 30 mL 0 05/15/2016 at Unknown time  . tiotropium (SPIRIVA) 18 MCG inhalation capsule Place 1 capsule (18 mcg total) into inhaler and inhale daily. For COPD (Patient taking differently: Place 18 mcg into inhaler and inhale 2 (two) times daily. For COPD) 30 capsule 12 05/15/2016 at Unknown time  . tiZANidine (ZANAFLEX) 2 MG tablet Take 2 tablets (4 mg total) by mouth 3 (three) times daily. For pain 1 tablet 0 Past Week at Unknown time  . traZODone (DESYREL) 300 MG tablet Take 1 tablet (300 mg total) by mouth at bedtime. For sleep 30 tablet 0 05/14/2016 at Unknown time  . XARELTO 20 MG TABS tablet Take 20 mg by mouth  daily.   Past Month at Unknown time   Musculoskeletal: Strength &  Muscle Tone: within normal limits Gait & Station: normal Patient leans: normal  Psychiatric Specialty Exam: Physical Exam  Constitutional: He is oriented to person, place, and time. He appears well-developed.  HENT:  Head: Normocephalic.  Eyes: Pupils are equal, round, and reactive to light.  Neck: Normal range of motion.  Cardiovascular: Normal rate.   Hx. High blood pressure  Respiratory: Effort normal.  GI: Soft.  Musculoskeletal: Normal range of motion.  Neurological: He is alert and oriented to person, place, and time.  Skin: Skin is warm and dry.    Review of Systems  Constitutional: Positive for malaise/fatigue.  HENT: Negative.   Eyes: Negative.   Respiratory: Negative.  Negative for cough and shortness of breath.   Cardiovascular: Negative.   Gastrointestinal: Positive for heartburn.  Genitourinary: Negative.   Musculoskeletal: Positive for back pain.  Skin: Negative.   Neurological: Positive for dizziness and weakness.  Endo/Heme/Allergies: Negative.   Psychiatric/Behavioral: Positive for depression, substance abuse and suicidal ideas. Negative for memory loss. The patient is nervous/anxious and has insomnia.     Blood pressure 107/84, pulse 88, temperature 97.9 F (36.6 C), temperature source Oral, resp. rate 16, height 6' (1.829 m), weight 96.2 kg (212 lb), SpO2 97 %.Body mass index is 28.75 kg/m.  General Appearance: Fairly Groomed  Patent attorney::  Fair  Speech:  Clear and Coherent and Normal Rate  Volume:  Normal  Mood:  Depressed  Affect:  Anxious, Worried   Thought Process:  Coherent  Orientation:  Full (Time, Place, and Person)  Thought Content:  Rumination  Suicidal Thoughts:  Currently denies any thoughts, plans or intent.  Homicidal Thoughts:  No  Memory:  Immediate;   Good Recent;   Good Remote;   Good  Judgement:  Fair  Insight:  Fair  Psychomotor Activity:  Decreased   Concentration:  Fair  Recall:  Good  Fund of Knowledge:Fair  Language: Good  Akathisia:  Negative  Handed:  Right  AIMS (if indicated):     Assets:  Desire for Improvement  ADL's:  Intact  Cognition: WNL  Sleep:      Treatment Plan/Recommendations: 1. Admit for crisis management and stabilization, estimated length of stay 3-5 days.  2. Medication management to reduce current symptoms to base line and improve the patient's overall level of functioning: See MAR. 3. Treat health problems as indicated.  4. Develop treatment plan to decrease risk of relapse upon discharge and the need for readmission.  5. Psycho-social education regarding relapse prevention and self care.  6. Health care follow up as needed for medical problems.  7. Review, reconcile, and reinstate any pertinent home medications for other health issues where appropriate: See MAR. 8. Call for consults with hospitalist for any additional specialty patient care services as needed.  Observation Level/Precautions:  15 minute checks  Laboratory:  As per the ED  Psychotherapy:  Individual/group  Medications: Resume Abilify 7.5 mg, Citalopram, Trazodone & Vistaril, including other pertinent home medications for the other medical issues.   Consultations: As needed  Discharge Concerns: Mood stability, maintaining sobriety.  Estimated LOS: 3-5 days  Other: Admit to 300-hall.   I certify that inpatient services furnished can reasonably be expected to improve the patient's condition.    Sanjuana Kava, NP, PMHNP, FNP-BC 12/15/201710:55 AM

## 2016-05-16 NOTE — Progress Notes (Signed)
Patient ID: Maudie FlakesMelvin K Curiale, male   DOB: 1961-01-25, 55 y.o.   MRN: [id number]  D: Patient in bed most of the evening. Pt mood and affect appeared depressed and flat.Pt answered writer's question but interaction with peers were minimal. Denies SI/HI/AVH and pain. No behavioral issues noted.  A: Support and encouragement offered as needed. Medications administered as prescribed.  R: Patient safe and cooperative on the unit. Pt did not attend evening AA group.

## 2016-05-16 NOTE — Progress Notes (Signed)
Patient did not attend AA group meeting. 

## 2016-05-16 NOTE — [facility] Group Notes (Signed)
[facility] LCSW Group Therapy 05/16/2016  1:15 PM   Type of Therapy: Group Therapy  Participation Level: Did Not Attend. Patient invited to participate but declined.   Braidyn Peace, MSW, LCSW Clinical Social Worker [facility] [phone]   

## 2016-05-16 NOTE — Progress Notes (Signed)
D: Patient reports that " I need to get out of this funk"; " I have been depressed"; patient denies any pain at this time or denies any needs  A: Start observation of this patient per protocol, admission process (see documentation);   R: Patient is sad, flat, depressed; patient give minimal answers; patient went straight to the bed after admission and had no other concerns at this time; writer will continue to monitor

## 2016-05-16 NOTE — Tx Team (Signed)
Initial Treatment Plan 05/16/2016 10:33 AM Billy FlakesMelvin K Flow [id number]    PATIENT STRESSORS: Medication change or noncompliance Substance abuse   PATIENT STRENGTHS: Average or above average intelligence Capable of independent living General fund of knowledge   PATIENT IDENTIFIED PROBLEMS: depression                     DISCHARGE CRITERIA:  Ability to meet basic life and health needs Improved stabilization in mood, thinking, and/or behavior Need for constant or close observation no longer present Reduction of life-threatening or endangering symptoms to within safe limits  PRELIMINARY DISCHARGE PLAN: Outpatient therapy Return to previous living arrangement  PATIENT/FAMILY INVOLVEMENT: This treatment plan has been presented to and reviewed with the patient, Billy [name].  The patient and family have been given the opportunity to ask questions and make suggestions.  [name], Kelilah Hebard M, RN 05/16/2016, 10:33 AM

## 2016-05-16 NOTE — Progress Notes (Signed)
Patient ID: Billy [name], male   DOB: 1961-04-22, 55 y.o.   MRN: [id number] PER STATE REGULATIONS 482.30  THIS CHART WAS REVIEWED FOR MEDICAL NECESSITY WITH RESPECT TO THE PATIENT'S ADMISSION/DURATION OF STAY.  NEXT REVIEW DATE:05/20/16  Loura HaltBARBARA Salome Hautala, RN, BSN CASE MANAGER

## 2016-05-17 ENCOUNTER — Encounter (HOSPITAL_COMMUNITY): Payer: Self-pay | Admitting: Psychiatry

## 2016-05-17 DIAGNOSIS — F3163 Bipolar disorder, current episode mixed, severe, without psychotic features: Secondary | ICD-10-CM | POA: Diagnosis present

## 2016-05-17 DIAGNOSIS — F17210 Nicotine dependence, cigarettes, uncomplicated: Secondary | ICD-10-CM

## 2016-05-17 DIAGNOSIS — Z794 Long term (current) use of insulin: Secondary | ICD-10-CM

## 2016-05-17 DIAGNOSIS — F1420 Cocaine dependence, uncomplicated: Secondary | ICD-10-CM

## 2016-05-17 DIAGNOSIS — Z79899 Other long term (current) drug therapy: Secondary | ICD-10-CM

## 2016-05-17 LAB — LIPID PANEL
Cholesterol: 202 mg/dL — ABNORMAL HIGH (ref 0–200)
HDL: 38 mg/dL — ABNORMAL LOW (ref >40)
LDL CALC: 122 mg/dL — AB (ref 0–99)
Total CHOL/HDL Ratio: 5.3 RATIO
Triglycerides: 211 mg/dL — ABNORMAL HIGH (ref <150)
VLDL: 42 mg/dL — AB (ref 0–40)

## 2016-05-17 LAB — GLUCOSE, CAPILLARY
GLUCOSE-CAPILLARY: 165 mg/dL — AB (ref 65–99)
GLUCOSE-CAPILLARY: 170 mg/dL — AB (ref 65–99)
Glucose-Capillary: 214 mg/dL — ABNORMAL HIGH (ref 65–99)
Glucose-Capillary: 157 mg/dL — ABNORMAL HIGH (ref 65–99)

## 2016-05-17 MED ORDER — TIZANIDINE HCL 2 MG PO TABS
4.0000 mg | ORAL_TABLET | Freq: Three times a day (TID) | ORAL | Status: DC | PRN
  Administered 2016-05-17 – 2016-05-20 (×8): 4 mg via ORAL
  Filled 2016-05-17 (×8): qty 2

## 2016-05-17 MED ORDER — TRAZODONE HCL 100 MG PO TABS
200.0000 mg | ORAL_TABLET | Freq: Every day | ORAL | Status: DC
  Administered 2016-05-17: 100 mg via ORAL
  Filled 2016-05-17 (×3): qty 2

## 2016-05-17 MED ORDER — TRAZODONE HCL 100 MG PO TABS
ORAL_TABLET | ORAL | Status: AC
  Filled 2016-05-17: qty 2

## 2016-05-17 NOTE — [facility] Group Notes (Signed)
[facility] Group Notes: (Clinical Social Work)   05/17/2016      Type of Therapy:  Group Therapy   Participation Level:  Did Not Attend despite MHT prompting   Ambrose MantleMareida Grossman-Orr, LCSW 05/17/2016, 12:36 PM

## 2016-05-17 NOTE — Plan of Care (Signed)
Problem: Safety: Goal: Periods of time without injury will increase Outcome: Progressing Pt denies SI. Pt is free from self harm this shift

## 2016-05-17 NOTE — [facility] Suicide Risk Assessment (Signed)
[facility]   Nursing information obtained from:  Patient Demographic factors:  Male, Divorced or widowed, Caucasian, Low socioeconomic status, Living alone, Unemployed Current Mental Status:  Self-harm thoughts Loss Factors:  NA Historical Factors:  Prior suicide attempts Risk Reduction Factors:  NA  Total Time spent with patient: 30 minutes Principal Problem: Bipolar disorder, curr episode mixed, severe, w/o psychotic features (HCC) Diagnosis:   Patient Active Problem List   Diagnosis Date Noted  . Bipolar disorder, curr episode mixed, severe, w/o psychotic features (HCC) [F31.63] 05/17/2016  . Cocaine use disorder, severe, dependence (HCC) [F14.20] 05/17/2016  . Affective psychosis, bipolar (HCC) [F31.9]   . Bipolar affective disorder, depressed (HCC) [F31.30] 08/09/2015  . Bipolar disorder with current episode depressed (HCC) [F31.30] 08/09/2015  . Polysubstance abuse [F19.10]   . Type 2 diabetes mellitus with hyperglycemia (HCC) [E11.65] 09/19/2014  . Right leg DVT (HCC) [I82.401] 09/19/2014  . Essential hypertension [I10] 09/19/2014  . Bipolar affective disorder, depressed, severe, with psychotic behavior (HCC) [F31.5] 09/14/2014  . Chronic pain syndrome [G89.4] 09/14/2014  . Cocaine abuse [F14.10] 09/13/2014  . Alcohol use disorder, mild, abuse [F10.10] 10/12/2012  . Homeless [Z59.0] 09/02/2011  . Opiate abuse, episodic [F11.10] 09/02/2011  . Diabetes mellitus [250] 08/30/2011  . WPW (Wolff-Parkinson-White syndrome) [I45.6] 08/30/2011   Subjective Data: Patient states he has been having mood swings , as well as suicidal; thoughts, has a hx of suicide attempts in the past. Pt wants to be restarted on his abilify, neurontin and trazodone. Pt with cocaine abuse - wants to get help. Pt goes to family service for follow up . Pt wants to continue to do the same.  Past psychiatric ZO:[id number]:Please see H&P.  Family hx: Please see H&P.   Continued Clinical  Symptoms:  Alcohol Use Disorder Identification Test Final Score (AUDIT): 0 The "Alcohol Use Disorders Identification Test", Guidelines for Use in Primary Care, Second Edition.  World Science writerHealth Organization [facility](WHO). Score between 0-7:  no or low risk or alcohol related problems. Score between 8-15:  moderate risk of alcohol related problems. Score between 16-19:  high risk of alcohol related problems. Score 20 or above:  warrants further diagnostic evaluation for alcohol dependence and treatment.   CLINICAL FACTORS:   Bipolar Disorder:   Mixed State Alcohol/Substance Abuse/Dependencies   Musculoskeletal: Strength & Muscle Tone: within normal limits Gait & Station: normal Patient leans: N/A  Psychiatric Specialty Exam: Physical Exam  Nursing note and vitals reviewed.   Review of Systems  Psychiatric/Behavioral: Positive for depression and substance abuse. The patient is nervous/anxious and has insomnia.   All other systems reviewed and are negative.   Blood pressure 127/89, pulse (!) 50, temperature 98.5 F (36.9 C), resp. rate 14, height 6' (1.829 m), weight 96.2 kg (212 lb), SpO2 97 %.Body mass index is 28.75 kg/m.  General Appearance: Guarded  Eye Contact:  Fair  Speech:  Clear and Coherent  Volume:  Normal  Mood:  Anxious and Depressed  Affect:  Appropriate  Thought Process:  Goal Directed and Descriptions of Associations: Intact  Orientation:  Full (Time, Place, and Person)  Thought Content:  Logical and Rumination  Suicidal Thoughts:  SI on admission - denies now  Homicidal Thoughts:  No  Memory:  Immediate;   Fair Recent;   Fair Remote;   Fair  Judgement:  Impaired  Insight:  Shallow  Psychomotor Activity:  Normal  Concentration:  Concentration: Fair and Attention Span: Fair  Recall:  FiservFair  Fund of Knowledge:  Fair  Language:  Fair  Akathisia:  No  Handed:  Right  AIMS (if indicated):     Assets:  Desire for Improvement  ADL's:  Intact  Cognition:  WNL  Sleep:          COGNITIVE FEATURES THAT CONTRIBUTE TO RISK:  Closed-mindedness, Polarized thinking and Thought constriction (tunnel vision)    SUICIDE RISK:   Moderate:  Frequent suicidal ideation with limited intensity, and duration, some specificity in terms of plans, no associated intent, good self-control, limited dysphoria/symptomatology, some risk factors present, and identifiable protective factors, including available and accessible social support.   PLAN OF CARE: Patient will benefit from inpatient treatment and stabilization.  Reviewed past medical records,treatment plan.   For mood sx; Continue Abilify 7.5 mg po bid. Celexa 20 mg po daily. Neurontin 1200 mg po tid.  For substance use do ( cocaine) Refer to substance abuse treatment program.  For insomnia: Trazodone 200 mg po qhs.  Restart home medications where indicated.  Order EKG for qtc.  Order tsh, lipid panel, hba1c, pl since he is on abilify.  Will continue to monitor vitals ,medication compliance and treatment side effects while patient is here.   Will monitor for medical issues as well as call consult as needed.   CSW will start working on disposition.   Patient to participate in therapeutic milieu .       I certify that inpatient services furnished can reasonably be expected to improve the patient's condition.  Nethra Mehlberg, MD 05/17/2016, 2:36 PM

## 2016-05-17 NOTE — Progress Notes (Signed)
EKG completed.  Informed Billy ManeMay Agustin NP about results.  New orders for changing xanaflex to prn instead of standing.  We will monitor the progress towards his goals.

## 2016-05-17 NOTE — Progress Notes (Addendum)
DAR Note: Billy [name] has been in the bed all day.  He has not attended groups.  He will get up for meals.  He continues to voice passive suicidal ideation that comes and goes and he agreed to contract for safety on the unit.  He c/o lower back pain and wheezing.  Inhaler given prn with relief.  Blood sugar at lunch was 165 and he received 7 units total of insulin for coverage.  He has taken his medication without difficulty and voiced no questions about his medication at this time.  He completed his self inventory and reported that his depression and anxiety was 9/10 and his hopelessness was 5/10.  He stated that his goal for today was "staying alive" and he will achieve that goal by "thinking positive."  Encouraged participation in group and unit activities.  Q 15 minute checks maintained for safety.  We will continue to monitor the progress towards his goals.

## 2016-05-17 NOTE — [facility] Suicide Risk Assessment (Signed)
[facility] INPATIENT:  Family/Significant Other Suicide Prevention Education  Suicide Prevention Education:  Patient Refusal for Family/Significant Other Suicide Prevention Education: The patient Billy [name]  has refused to provide written consent for family/significant other to be provided Family/Significant Other Suicide Prevention Education during admission and/or prior to discharge.  Physician notified.  Billy [name] 05/17/2016, 3:10 PM

## 2016-05-17 NOTE — B[facility] Counselor (Signed)
Adult Comprehensive Assessment  Patient ID: Billy [name], male   DOB: Jan 11, 1961, 55 y.o.   MRN: [id number]  Information Source: Information source: Patient  Current Stressors:  Educational / Learning stressors: Denies stressors Employment / Job issues: On disability Family Relationships: Does not talk to relatives Financial / Lack of resources (include bankruptcy): Not enough income, cannot work because of his back Housing / Lack of housing: Denies stressors Physical health (include injuries & life threatening diseases): Back pain - somewhat stressful Social relationships: Denies stressors Substance abuse: Previously stressful for drinking - but states he is clean from alcohol now, and "I'm done."  Has not had a drink in 2 years.  Has been using cocaine (crack). Bereavement / Loss: Denies stressors  Living/Environment/Situation:  Living Arrangements: Alone Living conditions (as described by patient or guardian): Neighbors use drugs How long has patient lived in current situation?: 2-1/2 months What is atmosphere in current home: Chaotic  Family History:  Marital status: Divorced Divorced, when?: 1998 What types of issues is patient dealing with in the relationship?: No problems - they talk Are you sexually active?: No Does patient have children?: Yes How many children?: 2 How is patient's relationship with their children?: Good with 54 YO son and 54 YO daughter who live in [place]  proud of both  Childhood History:  By whom was/is the patient raised?: Both parents Additional childhood history information: "I could not have paid for a better childhood. My parents were together 6869 years" Description of patient's relationship with caregiver when they were a child: Mother not so good; much better with father Patient's description of current relationship with people who raised him/her: Both parents are deceased How were you disciplined when you got in trouble as a  child/adolescent?: None Does patient have siblings?: No Number of Siblings: 3 Description of patient's current relationship with siblings: Has contact only with oldest sister, and not much Did patient suffer from severe childhood neglect?: No Has patient ever been sexually abused/assaulted/raped as an adolescent or adult?: No Was the patient ever a victim of a crime or a disaster?: No Witnessed domestic violence?: No Has patient been effected by domestic violence as an adult?: No  Education:  Highest grade of school patient has completed: 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, 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  Employment/Work Situation:   Employment situation: On disability Why is patient on disability: Severe Bipolar and for back pain How long has patient been on disability: [id number] Patient's job has been impacted by current illness: No What is the longest time patient has a held a job?: 3 years Where was the patient employed at that time?: [employer] in [place], [place] Has patient ever been in the [employer] and Companymilitary?: 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) Has patient ever served in combat?: No Did You Receive Any Psychiatric Treatment/Services While in Equities traderthe Military?: No Are There Guns or Other Weapons in Your Home?: No  Financial Resources:   Surveyor, quantityinancial resources: Insurance claims handlereceives SSDI, Medicare, Medicaid Does patient have a Lawyerrepresentative payee or guardian?: No  Alcohol/Substance Abuse:   What has been your use of drugs/alcohol within the last 12 months?: Crack cocaine just about daily - about $20 - states it is finished, he has run everybody off Alcohol/Substance Abuse Treatment Hx: Past Tx, Inpatient If yes, describe treatment: [facility] - 2 weeks a couple of years ago Has alcohol/substance abuse ever caused legal problems?: Yes  Social Support System:   Patient's Community Support System: Good Describe Community Support System: Therapist [facility]ita [facility] - every 7-10 days  Type of faith/religion: None How does patient's faith help to cope  with current illness?: N/A  Leisure/Recreation:   Leisure and Hobbies: Reading  Strengths/Needs:   What things does the patient do well?: Cooking, reading In what areas does patient struggle / problems for patient: Financial because of the crack cocaine, depression, suicidal ideation  Discharge Plan:   Does patient have access to transportation?: No Plan for no access to transportation at discharge: Will need bus pass Will patient be returning to same living situation after discharge?: Yes Currently receiving community mental health services: Yes (From Whom) ([facility] for therapy with Patton Sallesnita Jones (next is 12/19) and [place]inda, [place]) Does patient have financial barriers related to discharge medications?: No  Summary/Recommendations:   Summary and Recommendations (to be completed by the evaluator): Patient is a 55 year old Caucasian male admitted to the hospital with depression, suicidal ideation, a history of chronic mental illness & substance dependency.  He reports the trigger for this admission was deciding to stop his medication about a month ago because he was feeling great, then 4 days prior to admission deciding to try crack cocaine again, after which he became depressed and suicidal and called the police.  He reports sobriety from alcohol for two years, but he did drink one beer to "get courage to call the police."   Patient will benefit from crisis stabilization, medication evaluation, group therapy and psychoeducation, in addition to case management for discharge planning. At discharge it is recommended that Patient adhere to the established discharge plan and continue in treatment.  Lynnell ChadMareida J Grossman-Orr. 05/17/2016

## 2016-05-17 NOTE — Progress Notes (Signed)
D.  Pt pleasant on approach, complaint of insomnia.  Pt requests that his Trazodone be increased to 300 mg as this is what the was taking at home  Pt did not get up for AA group, remained in bed.  Pt observed interacting appropriately with peers on the unit.  PT did get up for snacks.  Pt denies SI/HI/hallucinations at this time.  A. Support and encouragement offered, spoke to NP about Trazodone dosage.  Dose increased to 200 mg and Pt encouraged to speak to doctor in AM.  R.  PT remains safe on the unit, will continue to monitor.

## 2016-05-17 NOTE — Progress Notes (Signed)
Pt did not attend AA group  

## 2016-05-18 LAB — GLUCOSE, CAPILLARY
Glucose-Capillary: 197 mg/dL — ABNORMAL HIGH (ref 65–99)
Glucose-Capillary: 185 mg/dL — ABNORMAL HIGH (ref 65–99)
Glucose-Capillary: 212 mg/dL — ABNORMAL HIGH (ref 65–99)
Glucose-Capillary: 168 mg/dL — ABNORMAL HIGH (ref 65–99)

## 2016-05-18 LAB — HEMOGLOBIN A1C
Hgb A1c MFr Bld: 8.7 % — ABNORMAL HIGH (ref 4.8–5.6)
MEAN PLASMA GLUCOSE: 203 mg/dL

## 2016-05-18 MED ORDER — DM-GUAIFENESIN ER 30-600 MG PO TB12
ORAL_TABLET | ORAL | Status: AC
  Administered 2016-05-18: 22:00:00
  Filled 2016-05-18: qty 2

## 2016-05-18 MED ORDER — TRAZODONE HCL 150 MG PO TABS
300.0000 mg | ORAL_TABLET | Freq: Every day | ORAL | Status: DC
  Administered 2016-05-18 – 2016-05-20 (×3): 300 mg via ORAL
  Filled 2016-05-18 (×6): qty 2

## 2016-05-18 MED ORDER — GUAIFENESIN ER 600 MG PO TB12
1200.0000 mg | ORAL_TABLET | Freq: Two times a day (BID) | ORAL | Status: DC
  Administered 2016-05-19 – 2016-05-22 (×7): 1200 mg via ORAL
  Filled 2016-05-18 (×13): qty 2

## 2016-05-18 MED ORDER — GUAIFENESIN ER 600 MG PO TB12
1200.0000 mg | ORAL_TABLET | Freq: Two times a day (BID) | ORAL | Status: DC
  Administered 2016-05-18: 1200 mg via ORAL
  Filled 2016-05-18 (×5): qty 2

## 2016-05-18 NOTE — Progress Notes (Signed)
[facility] MD Progress Note  05/18/2016 11:05 AM Billy [name]  MRN:  [id number] Subjective:  Patient states, "I have a cough and I'm congested." Objective:  Billy [name] was admitted after he verbalized suicidal ideations.  He has been a [facility] patient in the past and has a hx of hx of chronic mental illness & substance dependency.  He is depressed today, un motivated to get out of bed and states he feels tired and did not sleep well last night.  Principal Problem: Bipolar disorder, curr episode mixed, severe, w/o psychotic features (HCC) Diagnosis:   Patient Active Problem List   Diagnosis Date Noted  . Bipolar disorder, curr episode mixed, severe, w/o psychotic features (HCC) [F31.63] 05/17/2016  . Cocaine use disorder, severe, dependence (HCC) [F14.20] 05/17/2016  . Affective psychosis, bipolar (HCC) [F31.9]   . Bipolar affective disorder, depressed (HCC) [F31.30] 08/09/2015  . Bipolar disorder with current episode depressed (HCC) [F31.30] 08/09/2015  . Polysubstance abuse [F19.10]   . Type 2 diabetes mellitus with hyperglycemia (HCC) [E11.65] 09/19/2014  . Right leg DVT (HCC) [I82.401] 09/19/2014  . Essential hypertension [I10] 09/19/2014  . Bipolar affective disorder, depressed, severe, with psychotic behavior (HCC) [F31.5] 09/14/2014  . Chronic pain syndrome [G89.4] 09/14/2014  . Cocaine abuse [F14.10] 09/13/2014  . Alcohol use disorder, mild, abuse [F10.10] 10/12/2012  . Homeless [Z59.0] 09/02/2011  . Opiate abuse, episodic [F11.10] 09/02/2011  . Diabetes mellitus [250] 08/30/2011  . WPW (Wolff-Parkinson-White syndrome) [I45.6] 08/30/2011   Total Time spent with patient: 30 minutes  Past Psychiatric History: see HPI  Past Medical History:  Past Medical History:  Diagnosis Date  . Bipolar 1 disorder (HCC)   . Chronic pain   . Depression   . Diabetes mellitus   . Diabetes mellitus without complication (HCC)   . HCV (hepatitis C virus)   . Hepatitis C carrier (HCC)   .  Homeless   . Hypertension   . Suicide attempt   . WPW (Wolff-Parkinson-White syndrome)     Past Surgical History:  Procedure Laterality Date  . BACK SURGERY    . CHEST SURGERY    . RHINOPLASTY    . TONSILLECTOMY     Family History:  Family History  Problem Relation Age of Onset  . Mental illness Neg Hx    Family Psychiatric  History: see HPI Social History:  History  Alcohol Use  . Yes    Comment: last drink 24 days     History  Drug Use  . Types: Marijuana, "Crack" cocaine, Cocaine    Comment: last used 09/03/2014    Social History   Social History  . Marital status: Divorced    Spouse name: N/A  . Number of children: N/A  . Years of education: N/A   Social History Main Topics  . Smoking status: Current Every Day Smoker    Packs/day: 1.00    Years: 8.00    Types: Cigarettes  . Smokeless tobacco: Never Used  . Alcohol use Yes     Comment: last drink 24 days  . Drug use:     Types: Marijuana, "Crack" cocaine, Cocaine     Comment: last used 09/03/2014  . Sexual activity: Not Asked   Other Topics Concern  . None   Social History Narrative   ** Merged History Encounter **       Additional Social History:  Sleep: Fair  Appetite:  Fair  Current Medications: Current Facility-Administered Medications  Medication Dose Route Frequency Provider Last Rate Last Dose  . acetaminophen (TYLENOL) tablet 650 mg  650 mg Oral Q6H PRN Sanjuana KavaAgnes I Nwoko, NP   650 mg at 05/17/16 0842  . albuterol (PROVENTIL HFA;VENTOLIN HFA) 108 (90 Base) MCG/ACT inhaler 2 puff  2 puff Inhalation Q6H PRN Sanjuana KavaAgnes I Nwoko, NP   2 puff at 05/17/16 [phone]  . alum & mag hydroxide-simeth (MAALOX/MYLANTA) 200-200-20 MG/5ML suspension 30 mL  30 mL Oral Q4H PRN Sanjuana KavaAgnes I Nwoko, NP      . ARIPiprazole (ABILIFY) tablet 7.5 mg  7.5 mg Oral BID Sanjuana KavaAgnes I Nwoko, NP   7.5 mg at 05/18/16 0844  . canagliflozin (INVOKANA) tablet 300 mg  300 mg Oral Daily Sanjuana KavaAgnes I Nwoko, NP   300 mg at  05/18/16 0844  . citalopram (CELEXA) tablet 20 mg  20 mg Oral Daily Sanjuana KavaAgnes I Nwoko, NP   20 mg at 05/18/16 0843  . gabapentin (NEURONTIN) tablet 1,200 mg  1,200 mg Oral TID Sanjuana KavaAgnes I Nwoko, NP   1,200 mg at 05/18/16 0843  . hydrOXYzine (ATARAX/VISTARIL) tablet 50 mg  50 mg Oral QHS,MR X 1 Sanjuana KavaAgnes I Nwoko, NP   50 mg at 05/17/16 2146  . insulin aspart (novoLOG) injection 0-15 Units  0-15 Units Subcutaneous TID WC Sanjuana KavaAgnes I Nwoko, NP   3 Units at 05/18/16 0609  . insulin aspart (novoLOG) injection 4 Units  4 Units Subcutaneous TID WC Sanjuana KavaAgnes I Nwoko, NP   4 Units at 05/18/16 0610  . insulin glargine (LANTUS) injection 60 Units  60 Units Subcutaneous QHS Sanjuana KavaAgnes I Nwoko, NP   60 Units at 05/17/16 2133  . lisinopril (PRINIVIL,ZESTRIL) tablet 20 mg  20 mg Oral Daily Sanjuana KavaAgnes I Nwoko, NP   20 mg at 05/18/16 0844  . magnesium hydroxide (MILK OF MAGNESIA) suspension 30 mL  30 mL Oral Daily PRN Sanjuana KavaAgnes I Nwoko, NP      . nicotine (NICODERM CQ - dosed in mg/24 hours) patch 21 mg  21 mg Transdermal Q0600 Sanjuana KavaAgnes I Nwoko, NP   21 mg at 05/18/16 0843  . oxymetazoline (AFRIN) 0.05 % nasal spray 1 spray  1 spray Each Nare BID PRN Sanjuana KavaAgnes I Nwoko, NP   1 spray at 05/17/16 0841  . pantoprazole (PROTONIX) EC tablet 40 mg  40 mg Oral Daily Sanjuana KavaAgnes I Nwoko, NP   40 mg at 05/18/16 0844  . rivaroxaban (XARELTO) tablet 20 mg  20 mg Oral Daily Sanjuana KavaAgnes I Nwoko, NP   20 mg at 05/18/16 0845  . tiotropium (SPIRIVA) inhalation capsule 18 mcg  18 mcg Inhalation Daily Sanjuana KavaAgnes I Nwoko, NP   18 mcg at 05/18/16 0845  . tiZANidine (ZANAFLEX) tablet 4 mg  4 mg Oral Q8H PRN Adonis BrookSheila Maleak Brazzel, NP   4 mg at 05/18/16 0849  . traZODone (DESYREL) tablet 200 mg  200 mg Oral QHS Jackelyn PolingJason A Berry, NP   100 mg at 05/17/16 2200    Lab Results:  Results for orders placed or performed during the hospital encounter of 05/16/16 (from the past 48 hour(s))  Glucose, capillary     Status: Abnormal   Collection Time: 05/16/16 12:02 PM  Result Value Ref Range   Glucose-Capillary  184 (H) 65 - 99 mg/dL   Comment 1 Notify RN   Glucose, capillary     Status: Abnormal   Collection Time: 05/16/16  5:13 PM  Result Value Ref Range  Glucose-Capillary 234 (H) 65 - 99 mg/dL   Comment 1 Notify RN   Glucose, capillary     Status: Abnormal   Collection Time: 05/16/16  9:39 PM  Result Value Ref Range   Glucose-Capillary 188 (H) 65 - 99 mg/dL   Comment 1 Notify RN   Lipid panel     Status: Abnormal   Collection Time: 05/17/16  6:20 AM  Result Value Ref Range   Cholesterol 202 (H) 0 - 200 mg/dL   Triglycerides 161211 (H) <150 mg/dL   HDL 38 (L) >09>40 mg/dL   Total CHOL/HDL Ratio 5.3 RATIO   VLDL 42 (H) 0 - 40 mg/dL   LDL Cholesterol 604122 (H) 0 - 99 mg/dL    Comment:        Total Cholesterol/HDL:CHD Risk Coronary Heart Disease Risk Table                     Men   Women  1/2 Average Risk   3.4   3.3  Average Risk       5.0   4.4  2 X Average Risk   9.6   7.1  3 X Average Risk  23.4   11.0        Use the calculated Patient Ratio above and the CHD Risk Table to determine the patient's CHD Risk.        ATP III CLASSIFICATION (LDL):  <100     mg/dL   Optimal  540-981100-129  mg/dL   Near or Above                    Optimal  130-159  mg/dL   Borderline  191-478160-189  mg/dL   High  >295>190     mg/dL   Very High [facility]   Glucose, capillary     Status: Abnormal   Collection Time: 05/17/16  6:39 AM  Result Value Ref Range   Glucose-Capillary 214 (H) 65 - 99 mg/dL  Glucose, capillary     Status: Abnormal   Collection Time: 05/17/16 12:00 PM  Result Value Ref Range   Glucose-Capillary 165 (H) 65 - 99 mg/dL  Glucose, capillary     Status: Abnormal   Collection Time: 05/17/16  5:15 PM  Result Value Ref Range   Glucose-Capillary 170 (H) 65 - 99 mg/dL  Glucose, capillary     Status: Abnormal   Collection Time: 05/17/16  9:27 PM  Result Value Ref Range   Glucose-Capillary 157 (H) 65 - 99 mg/dL  Glucose, capillary     Status: Abnormal   Collection Time:  05/18/16  6:04 AM  Result Value Ref Range   Glucose-Capillary 197 (H) 65 - 99 mg/dL   Comment 1 Notify RN     Blood Alcohol level:  Lab Results  Component Value Date   ETH 71 (H) 05/15/2016   ETH <5 08/08/2015    Metabolic Disorder Labs: Lab Results  Component Value Date   HGBA1C 6.7 (H) 09/14/2014   MPG 146 09/14/2014   MPG 126 (H) 10/12/2012   No results found for: PROLACTIN Lab Results  Component Value Date   CHOL 202 (H) 05/17/2016   TRIG 211 (H) 05/17/2016   HDL 38 (L) 05/17/2016   CHOLHDL 5.3 05/17/2016   VLDL 42 (H) 05/17/2016   LDLCALC 122 (H) 05/17/2016    Physical Findings: AIMS: Facial and Oral Movements Muscles of Facial Expression: None, normal Lips and Perioral Area: None, normal  Jaw: None, normal Tongue: None, normal,Extremity Movements Upper (arms, wrists, hands, fingers): None, normal Lower (legs, knees, ankles, toes): None, normal, Trunk Movements Neck, shoulders, hips: None, normal, Overall Severity Severity of abnormal movements (highest score from questions above): None, normal Incapacitation due to abnormal movements: None, normal Patient's awareness of abnormal movements (rate only patient's report): No Awareness, Dental Status Current problems with teeth and/or dentures?: No Does patient usually wear dentures?: No  CIWA:  CIWA-Ar Total: 1 COWS:     Musculoskeletal: Strength & Muscle Tone: within normal limits Gait & Station: normal Patient leans: N/A  Psychiatric Specialty Exam: Physical Exam  Nursing note and vitals reviewed.   Review of Systems  Constitutional: Negative.   HENT: Negative.   Eyes: Negative.   Respiratory: Positive for cough and shortness of breath.   Cardiovascular: Negative.   Gastrointestinal: Negative.   Genitourinary: Negative.   Musculoskeletal: Negative.   Skin: Negative.   Neurological: Negative.   Endo/Heme/Allergies: Negative.   Psychiatric/Behavioral: Negative.   All other systems reviewed and  are negative.   Blood pressure 112/85, pulse 81, temperature 98.4 F (36.9 C), resp. rate 18, height 6' (1.829 m), weight 96.2 kg (212 lb), SpO2 97 %.Body mass index is 28.75 kg/m.  General Appearance: Fairly Groomed  Eye Contact:  Fair  Speech:  Clear and Coherent  Volume:  Normal  Mood:  Depressed  Affect:  Depressed and Flat  Thought Process:  Coherent  Orientation:  Full (Time, Place, and Person)  Thought Content:  Rumination  Suicidal Thoughts:  No  Homicidal Thoughts:  No  Memory:  Immediate;   Fair Recent;   Fair Remote;   Fair  Judgement:  Fair  Insight:  Fair  Psychomotor Activity:  Flacid  Concentration:  Concentration: Fair and Attention Span: Fair  Recall:  [employer] of Knowledge:  Fair  Language:  Good  Akathisia:  No  Handed:  Right  AIMS (if indicated):     Assets:  Desire for Improvement  ADL's:  Intact  Cognition:  WNL  Sleep:  Number of Hours: 6.25   Treatment Plan Summary: Review of chart, vital signs, medications, and notes.  1-Individual and group therapy  2-Medication management for depression and anxiety: Medications reviewed with the patient.  For mood sx; Continue Abilify 7.5 mg po bid. Celexa 20 mg po daily. Neurontin 1200 mg po tid.  For substance use do ( cocaine) Refer to substance abuse treatment program.  For insomnia: Trazodone 200 mg po qhs.  Restart home medications where indicated.  ADDED Mucinex 1200 mg for cough, chest congestion and discomfort  Order EKG for qtc.  Order tsh, lipid panel, hba1c, pl since he is on abilify.  3-Coping skills for depression, anxiety  4-Continue crisis stabilization and management  5-Address health issues--monitoring vital signs, stable  6-Treatment plan in progress to prevent relapse of depression and anxiety  Lindwood Qua, NP [facility] 05/18/2016, 11:05 AM

## 2016-05-18 NOTE — Progress Notes (Signed)
Patient did not attend the evening speaker AA meeting. Pt was notified that group was beginning but remained in bed.   

## 2016-05-18 NOTE — Progress Notes (Signed)
Patient denies SI, HI and AVH this shift.  Patient had no complaints other than the somatic complaint of his back hurting.  Patient attended groups and was compliant with medications.   Assess patient for safety, offer medications as prescribed, engage patient in 1:1 staff talks.   Continue to monitor as prescribed.  Patient able to contract for safety.  

## 2016-05-18 NOTE — Progress Notes (Signed)
D.  Pt pleasant on approach, having sinus difficulty.  Pt requested second dose of Mucinex tonight and order was received to have it at bedtime tonight since only given once during day.  Called Dr. Elna BreslowEappen to get order for Trazodone 300 mg raised from 200 mg.  Pt would like to speak to doctor about his zanaflex, he is used to it being every 6 hours rather than 8.  Pt did not attend evening AA group, but did get up for evening snacks.  Pt denies SI/HI/hallucinations at this time.  A.  Support and encouragement offered, medications given as ordered.  R.  Pt remains safe on the unit, will continue to monitor.

## 2016-05-18 NOTE — [facility] Group Notes (Signed)
[facility] Group Notes: (Clinical Social Work)   05/18/2016      Type of Therapy:  Group Therapy   Participation Level:  Did Not Attend despite MHT prompting   Lulie Hurd Grossman-Orr, LCSW 05/18/2016, 12:31 PM     

## 2016-05-18 NOTE — Plan of Care (Signed)
Problem: Medication: Goal: Compliance with prescribed medication regimen will improve Outcome: Progressing Pt has been compliant with medication regimen   

## 2016-05-19 DIAGNOSIS — F1020 Alcohol dependence, uncomplicated: Secondary | ICD-10-CM

## 2016-05-19 DIAGNOSIS — Z9889 Other specified postprocedural states: Secondary | ICD-10-CM

## 2016-05-19 LAB — GLUCOSE, CAPILLARY
GLUCOSE-CAPILLARY: 208 mg/dL — AB (ref 65–99)
Glucose-Capillary: 192 mg/dL — ABNORMAL HIGH (ref 65–99)
Glucose-Capillary: 257 mg/dL — ABNORMAL HIGH (ref 65–99)

## 2016-05-19 LAB — TSH: TSH: 0.229 u[IU]/mL — ABNORMAL LOW (ref 0.350–4.500)

## 2016-05-19 NOTE — Tx Team (Signed)
Interdisciplinary Treatment and Diagnostic Plan Update  05/19/2016 Time of Session: 9:30AM Billy [name] MRN: [id number]  Principal Diagnosis: Bipolar disorder, curr episode mixed, severe, w/o psychotic features (HCC)  Secondary Diagnoses: Principal Problem:   Bipolar disorder, curr episode mixed, severe, w/o psychotic features (HCC) Active Problems:   Type 2 diabetes mellitus with hyperglycemia (HCC)   Cocaine use disorder, severe, dependence (HCC)   Current Medications:  Current Facility-Administered Medications  Medication Dose Route Frequency Provider Last Rate Last Dose  . acetaminophen (TYLENOL) tablet 650 mg  650 mg Oral Q6H PRN Sanjuana KavaAgnes I Nwoko, NP   650 mg at 05/17/16 0842  . albuterol (PROVENTIL HFA;VENTOLIN HFA) 108 (90 Base) MCG/ACT inhaler 2 puff  2 puff Inhalation Q6H PRN Sanjuana KavaAgnes I Nwoko, NP   2 puff at 05/17/16 [phone]  . alum & mag hydroxide-simeth (MAALOX/MYLANTA) 200-200-20 MG/5ML suspension 30 mL  30 mL Oral Q4H PRN Sanjuana KavaAgnes I Nwoko, NP      . ARIPiprazole (ABILIFY) tablet 7.5 mg  7.5 mg Oral BID Sanjuana KavaAgnes I Nwoko, NP   7.5 mg at 05/18/16 1622  . canagliflozin (INVOKANA) tablet 300 mg  300 mg Oral Daily Sanjuana KavaAgnes I Nwoko, NP   300 mg at 05/19/16 0856  . citalopram (CELEXA) tablet 20 mg  20 mg Oral Daily Sanjuana KavaAgnes I Nwoko, NP   20 mg at 05/19/16 0856  . gabapentin (NEURONTIN) tablet 1,200 mg  1,200 mg Oral TID Sanjuana KavaAgnes I Nwoko, NP   1,200 mg at 05/19/16 0856  . guaiFENesin (MUCINEX) 12 hr tablet 1,200 mg  1,200 mg Oral BID Jackelyn PolingJason A Berry, NP      . hydrOXYzine (ATARAX/VISTARIL) tablet 50 mg  50 mg Oral QHS,MR X 1 Sanjuana KavaAgnes I Nwoko, NP   50 mg at 05/19/16 0133  . insulin aspart (novoLOG) injection 0-15 Units  0-15 Units Subcutaneous TID WC Sanjuana KavaAgnes I Nwoko, NP   3 Units at 05/19/16 [phone]  . insulin aspart (novoLOG) injection 4 Units  4 Units Subcutaneous TID WC Sanjuana KavaAgnes I Nwoko, NP   4 Units at 05/19/16 0608  . insulin glargine (LANTUS) injection 60 Units  60 Units Subcutaneous QHS Sanjuana KavaAgnes I Nwoko, NP   60  Units at 05/18/16 2113  . lisinopril (PRINIVIL,ZESTRIL) tablet 20 mg  20 mg Oral Daily Sanjuana KavaAgnes I Nwoko, NP   20 mg at 05/19/16 0856  . magnesium hydroxide (MILK OF MAGNESIA) suspension 30 mL  30 mL Oral Daily PRN Sanjuana KavaAgnes I Nwoko, NP      . nicotine (NICODERM CQ - dosed in mg/24 hours) patch 21 mg  21 mg Transdermal Q0600 Sanjuana KavaAgnes I Nwoko, NP   21 mg at 05/19/16 0857  . oxymetazoline (AFRIN) 0.05 % nasal spray 1 spray  1 spray Each Nare BID PRN Sanjuana KavaAgnes I Nwoko, NP   1 spray at 05/17/16 0841  . pantoprazole (PROTONIX) EC tablet 40 mg  40 mg Oral Daily Sanjuana KavaAgnes I Nwoko, NP   40 mg at 05/19/16 0857  . rivaroxaban (XARELTO) tablet 20 mg  20 mg Oral Daily Sanjuana KavaAgnes I Nwoko, NP   20 mg at 05/19/16 0856  . tiotropium (SPIRIVA) inhalation capsule 18 mcg  18 mcg Inhalation Daily Sanjuana KavaAgnes I Nwoko, NP   18 mcg at 05/19/16 0856  . tiZANidine (ZANAFLEX) tablet 4 mg  4 mg Oral Q8H PRN Adonis BrookSheila Agustin, NP   4 mg at 05/19/16 0133  . traZODone (DESYREL) tablet 300 mg  300 mg Oral QHS Jomarie LongsSaramma Eappen, MD   300 mg at 05/18/16 2157  PTA Medications: Prescriptions Prior to Admission  Medication Sig Dispense Refill Last Dose  . albuterol (PROAIR HFA) 108 (90 Base) MCG/ACT inhaler Inhale 2 puffs into the lungs every 6 (six) hours as needed for shortness of breath. 1 Inhaler 0 Past Month at Unknown time  . ARIPiprazole (ABILIFY) 15 MG tablet Take 0.5 tablets (7.5 mg total) by mouth 2 (two) times daily. For mood control 30 tablet 0 Past Month at Unknown time  . citalopram (CELEXA) 20 MG tablet Take 1 tablet (20 mg total) by mouth daily. For depression 30 tablet 0 Past Month at Unknown time  . gabapentin (NEURONTIN) 400 MG capsule Take 3 capsules (1,200 mg total) by mouth 3 (three) times daily. For agitation 270 capsule 0 Past Month at Unknown time  . hydrOXYzine (ATARAX/VISTARIL) 50 MG tablet Take 1 tablet (50 mg total) by mouth at bedtime and may repeat dose one time if needed. For anxiety/insomnia 60 tablet 0 Past Month at Unknown time  .  insulin glargine (LANTUS) 100 UNIT/ML injection Inject 0.6 mLs (60 Units total) into the skin at bedtime. For diabetes managment (Patient taking differently: Inject 50 Units into the skin at bedtime. For diabetes managment) 10 mL 0 Past Week at Unknown time  . INVOKANA 300 MG TABS tablet Take 300 mg by mouth daily.  2 Past Month at Unknown time  . lisinopril (PRINIVIL,ZESTRIL) 20 MG tablet Take 1 tablet (20 mg total) by mouth daily. For high blood pressure 30 tablet 0 Past Month at Unknown time  . omeprazole (PRILOSEC) 20 MG capsule Take 2 capsules (40 mg total) by mouth daily. For acid reflux   05/14/2016 at Unknown time  . oxymetazoline (AFRIN) 0.05 % nasal spray Place 1 spray into both nostrils 2 (two) times daily as needed for congestion. 30 mL 0 05/15/2016 at Unknown time  . tiotropium (SPIRIVA) 18 MCG inhalation capsule Place 1 capsule (18 mcg total) into inhaler and inhale daily. For COPD (Patient taking differently: Place 18 mcg into inhaler and inhale 2 (two) times daily. For COPD) 30 capsule 12 05/15/2016 at Unknown time  . tiZANidine (ZANAFLEX) 2 MG tablet Take 2 tablets (4 mg total) by mouth 3 (three) times daily. For pain 1 tablet 0 Past Week at Unknown time  . traZODone (DESYREL) 300 MG tablet Take 1 tablet (300 mg total) by mouth at bedtime. For sleep 30 tablet 0 05/14/2016 at Unknown time  . XARELTO 20 MG TABS tablet Take 20 mg by mouth daily.   Past Month at Unknown time    Patient Stressors: Medication change or noncompliance Substance abuse  Patient Strengths: Average or above average intelligence Capable of independent living General fund of knowledge  Treatment Modalities: Medication Management, Group therapy, Case management,  1 to 1 session with clinician, Psychoeducation, Recreational therapy.   Physician Treatment Plan for Primary Diagnosis: Bipolar disorder, curr episode mixed, severe, w/o psychotic features (HCC) Long Term Goal(s):     Short Term Goals:     Medication Management: Evaluate patient's response, side effects, and tolerance of medication regimen.  Therapeutic Interventions: 1 to 1 sessions, Unit Group sessions and Medication administration.  Evaluation of Outcomes: Progressing  Physician Treatment Plan for Secondary Diagnosis: Principal Problem:   Bipolar disorder, curr episode mixed, severe, w/o psychotic features (HCC) Active Problems:   Type 2 diabetes mellitus with hyperglycemia (HCC)   Cocaine use disorder, severe, dependence (HCC)  Long Term Goal(s):     Short Term Goals:       Medication Management:  Evaluate patient's response, side effects, and tolerance of medication regimen.  Therapeutic Interventions: 1 to 1 sessions, Unit Group sessions and Medication administration.  Evaluation of Outcomes: Progressing   RN Treatment Plan for Primary Diagnosis: Bipolar disorder, curr episode mixed, severe, w/o psychotic features (HCC) Long Term Goal(s): Knowledge of disease and therapeutic regimen to maintain health will improve  Short Term Goals: Ability to remain free from injury will improve, Ability to disclose and discuss suicidal ideas and Ability to identify and develop effective coping behaviors will improve  Medication Management: RN will administer medications as ordered by provider, will assess and evaluate patient's response and provide education to patient for prescribed medication. RN will report any adverse and/or side effects to prescribing provider.  Therapeutic Interventions: 1 on 1 counseling sessions, Psychoeducation, Medication administration, Evaluate responses to treatment, Monitor vital signs and CBGs as ordered, Perform/monitor CIWA, COWS, AIMS and Fall Risk screenings as ordered, Perform wound care treatments as ordered.  Evaluation of Outcomes: Progressing   LCSW Treatment Plan for Primary Diagnosis: Bipolar disorder, curr episode mixed, severe, w/o psychotic features (HCC) Long Term Goal(s):  Safe transition to appropriate next level of care at discharge, Engage patient in therapeutic group addressing interpersonal concerns.  Short Term Goals: Engage patient in aftercare planning with referrals and resources, Facilitate patient progression through stages of change regarding substance use diagnoses and concerns and Identify triggers associated with mental health/substance abuse issues  Therapeutic Interventions: Assess for all discharge needs, 1 to 1 time with Social worker, Explore available resources and support systems, Assess for adequacy in community support network, Educate family and significant other(s) on suicide prevention, Complete Psychosocial Assessment, Interpersonal group therapy.  Evaluation of Outcomes: Progressing   Progress in Treatment: Attending groups: Yes. Participating in groups: Yes. Taking medication as prescribed: Yes. Toleration medication: Yes. Family/Significant other contact made: SPE completed with pt; pt declined to consent to family contact.  Patient understands diagnosis: Yes. Discussing patient identified problems/goals with staff: Yes. Medical problems stabilized or resolved: Yes. Denies suicidal/homicidal ideation: Yes. Issues/concerns per patient self-inventory: No. Other: n/a   New problem(s) identified: No, Describe:  n/a  New Short Term/Long Term Goal(s): medication stabilization; detox; development of comprehensive mental wellness/sobriety plan.   Discharge Plan or Barriers: CSW assessing for appropriate referrals. [facility].   Reason for Continuation of Hospitalization: Depression Medication stabilization Withdrawal symptoms  Estimated Length of Stay: 2-3 days   Attendees: Patient: 05/19/2016 9:01 AM  Physician: Dr. Jama Flavorsobos MD 05/19/2016 9:01 AM  Nursing: Roxan Dieselhrista, Roni RN 05/19/2016 9:01 AM  RN Care Manager: Onnie BoerJennifer Clark CM  05/19/2016 9:01 AM  Social Worker: Trula SladeHeather Smart, LCSW 05/19/2016 9:01 AM  Recreational Therapist:  05/19/2016 9:01 AM  Other: Hillery Jacksanika Lewis NP; Armandina Stammergnes Nwoko NP 05/19/2016 9:01 AM  Other:  05/19/2016 9:01 AM  Other: 05/19/2016 9:01 AM    Scribe for Treatment Team: Ledell PeoplesHeather N Smart, LCSW 05/19/2016 9:01 AM

## 2016-05-19 NOTE — Progress Notes (Signed)
[facility]  05/19/2016 3:03 PM Billy FlakesMelvin K [name]  MRN:  [id number]  Subjective: [name] reports, "My mood is getting there, still feeling some depression".  Objective:  [name] is seen, chart reviewed. He is lying down in his bed. He is alert, oriented x 3 & aware of situation. He is reporting decreasing symptoms of depression. He reports that his mood seems to be stabilizing. He continues to endorse some depressive symptoms. He is cooperative with assessment and appears to be interacting well on the unit. He denies any adverse reactions to medications and is compliant with his regimen. He is not participating much in the group milieu. He is encouraged to participate in the group sessions. He currently denies any SIHI, AVH. Denies any substance withdrawal symptoms.  Principal Problem: Bipolar disorder, curr episode mixed, severe, w/o psychotic features (HCC) Diagnosis:   Patient Active Problem List   Diagnosis Date Noted  . Bipolar disorder, curr episode mixed, severe, w/o psychotic features (HCC) [F31.63] 05/17/2016  . Cocaine use disorder, severe, dependence (HCC) [F14.20] 05/17/2016  . Affective psychosis, bipolar (HCC) [F31.9]   . Bipolar affective disorder, depressed (HCC) [F31.30] 08/09/2015  . Bipolar disorder with current episode depressed (HCC) [F31.30] 08/09/2015  . Polysubstance abuse [F19.10]   . Type 2 diabetes mellitus with hyperglycemia (HCC) [E11.65] 09/19/2014  . Right leg DVT (HCC) [I82.401] 09/19/2014  . Essential hypertension [I10] 09/19/2014  . Bipolar affective disorder, depressed, severe, with psychotic behavior (HCC) [F31.5] 09/14/2014  . Chronic pain syndrome [G89.4] 09/14/2014  . Cocaine abuse [F14.10] 09/13/2014  . Alcohol use disorder, mild, abuse [F10.10] 10/12/2012  . Homeless [Z59.0] 09/02/2011  . Opiate abuse, episodic [F11.10] 09/02/2011  . Diabetes mellitus [250] 08/30/2011  . WPW (Wolff-Parkinson-White syndrome) [I45.6] 08/30/2011   Total Time  spent with patient: 15 minutes  Past Psychiatric History: Hx. Alcoholism  Past Medical History:  Past Medical History:  Diagnosis Date  . Bipolar 1 disorder (HCC)   . Chronic pain   . Depression   . Diabetes mellitus   . Diabetes mellitus without complication (HCC)   . HCV (hepatitis C virus)   . Hepatitis C carrier (HCC)   . Homeless   . Hypertension   . Suicide attempt   . WPW (Wolff-Parkinson-White syndrome)     Past Surgical History:  Procedure Laterality Date  . BACK SURGERY    . CHEST SURGERY    . RHINOPLASTY    . TONSILLECTOMY     Family History:  Family History  Problem Relation Age of Onset  . Mental illness Neg Hx    Family Psychiatric  History: See H&P  Social History:  History  Alcohol Use  . Yes    Comment: last drink 24 days     History  Drug Use  . Types: Marijuana, "Crack" cocaine, Cocaine    Comment: last used 09/03/2014    Social History   Social History  . Marital status: Divorced    Spouse name: N/A  . Number of children: N/A  . Years of education: N/A   Social History Main Topics  . Smoking status: Current Every Day Smoker    Packs/day: 1.00    Years: 8.00    Types: Cigarettes  . Smokeless tobacco: Never Used  . Alcohol use Yes     Comment: last drink 24 days  . Drug use:     Types: Marijuana, "Crack" cocaine, Cocaine     Comment: last used 09/03/2014  . Sexual activity: Not Asked  Other Topics Concern  . None   Social History Narrative   ** Merged History Encounter **       Additional Social History:   Sleep: Fair  Appetite:  Fair  Current Medications: Current Facility-Administered Medications  Medication Dose Route Frequency Provider Last Rate Last Dose  . acetaminophen (TYLENOL) tablet 650 mg  650 mg Oral Q6H PRN Sanjuana Kava, NP   650 mg at 05/17/16 0842  . albuterol (PROVENTIL HFA;VENTOLIN HFA) 108 (90 Base) MCG/ACT inhaler 2 puff  2 puff Inhalation Q6H PRN Sanjuana Kava, NP   2 puff at 05/17/16 [phone]  . alum &  mag hydroxide-simeth (MAALOX/MYLANTA) 200-200-20 MG/5ML suspension 30 mL  30 mL Oral Q4H PRN Sanjuana Kava, NP      . ARIPiprazole (ABILIFY) tablet 7.5 mg  7.5 mg Oral BID Sanjuana Kava, NP   7.5 mg at 05/19/16 0856  . canagliflozin (INVOKANA) tablet 300 mg  300 mg Oral Daily Sanjuana Kava, NP   300 mg at 05/19/16 0856  . citalopram (CELEXA) tablet 20 mg  20 mg Oral Daily Sanjuana Kava, NP   20 mg at 05/19/16 0856  . gabapentin (NEURONTIN) tablet 1,200 mg  1,200 mg Oral TID Sanjuana Kava, NP   1,200 mg at 05/19/16 0856  . guaiFENesin (MUCINEX) 12 hr tablet 1,200 mg  1,200 mg Oral BID Jackelyn Poling, NP   1,200 mg at 05/19/16 0856  . hydrOXYzine (ATARAX/VISTARIL) tablet 50 mg  50 mg Oral QHS,MR X 1 Sanjuana Kava, NP   50 mg at 05/19/16 0133  . insulin aspart (novoLOG) injection 0-15 Units  0-15 Units Subcutaneous TID WC Sanjuana Kava, NP   3 Units at 05/19/16 [phone]  . insulin aspart (novoLOG) injection 4 Units  4 Units Subcutaneous TID WC Sanjuana Kava, NP   4 Units at 05/19/16 0608  . insulin glargine (LANTUS) injection 60 Units  60 Units Subcutaneous QHS Sanjuana Kava, NP   60 Units at 05/18/16 2113  . lisinopril (PRINIVIL,ZESTRIL) tablet 20 mg  20 mg Oral Daily Sanjuana Kava, NP   20 mg at 05/19/16 0856  . magnesium hydroxide (MILK OF MAGNESIA) suspension 30 mL  30 mL Oral Daily PRN Sanjuana Kava, NP      . nicotine (NICODERM CQ - dosed in mg/24 hours) patch 21 mg  21 mg Transdermal Q0600 Sanjuana Kava, NP   21 mg at 05/19/16 0857  . oxymetazoline (AFRIN) 0.05 % nasal spray 1 spray  1 spray Each Nare BID PRN Sanjuana Kava, NP   1 spray at 05/17/16 0841  . pantoprazole (PROTONIX) EC tablet 40 mg  40 mg Oral Daily Sanjuana Kava, NP   40 mg at 05/19/16 0857  . rivaroxaban (XARELTO) tablet 20 mg  20 mg Oral Daily Sanjuana Kava, NP   20 mg at 05/19/16 0856  . tiotropium (SPIRIVA) inhalation capsule 18 mcg  18 mcg Inhalation Daily Sanjuana Kava, NP   18 mcg at 05/19/16 0856  . tiZANidine (ZANAFLEX) tablet  4 mg  4 mg Oral Q8H PRN Adonis Brook, NP   4 mg at 05/19/16 0903  . traZODone (DESYREL) tablet 300 mg  300 mg Oral QHS Jomarie Longs, MD   300 mg at 05/18/16 2157   Lab Results:  Results for orders placed or performed during the hospital encounter of 05/16/16 (from the past 48 hour(s))  Glucose, capillary     Status: Abnormal  Collection Time: 05/17/16  5:15 PM  Result Value Ref Range   Glucose-Capillary 170 (H) 65 - 99 mg/dL  Glucose, capillary     Status: Abnormal   Collection Time: 05/17/16  9:27 PM  Result Value Ref Range   Glucose-Capillary 157 (H) 65 - 99 mg/dL  Glucose, capillary     Status: Abnormal   Collection Time: 05/18/16  6:04 AM  Result Value Ref Range   Glucose-Capillary 197 (H) 65 - 99 mg/dL   Comment 1 Notify RN   Glucose, capillary     Status: Abnormal   Collection Time: 05/18/16 11:58 AM  Result Value Ref Range   Glucose-Capillary 185 (H) 65 - 99 mg/dL  Glucose, capillary     Status: Abnormal   Collection Time: 05/18/16  4:45 PM  Result Value Ref Range   Glucose-Capillary 212 (H) 65 - 99 mg/dL  Glucose, capillary     Status: Abnormal   Collection Time: 05/18/16  9:10 PM  Result Value Ref Range   Glucose-Capillary 168 (H) 65 - 99 mg/dL   Comment 1 Notify RN    Comment 2 Document in Chart   Glucose, capillary     Status: Abnormal   Collection Time: 05/19/16  6:05 AM  Result Value Ref Range   Glucose-Capillary 192 (H) 65 - 99 mg/dL  TSH     Status: Abnormal   Collection Time: 05/19/16  6:18 AM  Result Value Ref Range   TSH 0.229 (L) 0.350 - 4.500 uIU/mL    Comment: Performed by a 3rd Generation assay with a functional sensitivity of <=0.01 uIU/mL. Performed at [facility]    Blood Alcohol level:  Lab Results  Component Value Date   ETH 71 (H) 05/15/2016   ETH <5 08/08/2015   Metabolic Disorder Labs: Lab Results  Component Value Date   HGBA1C 8.7 (H) 05/17/2016   MPG 203 05/17/2016   MPG 146 09/14/2014   No results found  for: PROLACTIN Lab Results  Component Value Date   CHOL 202 (H) 05/17/2016   TRIG 211 (H) 05/17/2016   HDL 38 (L) 05/17/2016   CHOLHDL 5.3 05/17/2016   VLDL 42 (H) 05/17/2016   LDLCALC 122 (H) 05/17/2016    Physical Findings: AIMS: Facial and Oral Movements Muscles of Facial Expression: None, normal Lips and Perioral Area: None, normal Jaw: None, normal Tongue: None, normal,Extremity Movements Upper (arms, wrists, hands, fingers): None, normal Lower (legs, knees, ankles, toes): None, normal, Trunk Movements Neck, shoulders, hips: None, normal, Overall Severity Severity of abnormal movements (highest score from questions above): None, normal Incapacitation due to abnormal movements: None, normal Patient's awareness of abnormal movements (rate only patient's report): No Awareness, Dental Status Current problems with teeth and/or dentures?: No Does patient usually wear dentures?: No  CIWA:  CIWA-Ar Total: 0 COWS:     Musculoskeletal: Strength & Muscle Tone: within normal limits Gait & Station: normal Patient leans: N/A  Psychiatric Specialty Exam: Physical Exam  Nursing note and vitals reviewed.   Review of Systems  Constitutional: Negative.   HENT: Negative.   Eyes: Negative.   Respiratory: Positive for cough and shortness of breath.   Cardiovascular: Negative.   Gastrointestinal: Negative.   Genitourinary: Negative.   Musculoskeletal: Negative.   Skin: Negative.   Neurological: Negative.   Endo/Heme/Allergies: Negative.   Psychiatric/Behavioral: Positive for depression ("Improving") and substance abuse (Hx. alcoholism). Negative for hallucinations, memory loss and suicidal ideas. The patient is nervous/anxious ("Improving") and has insomnia ("Improving"  ).  All other systems reviewed and are negative.   Blood pressure 128/76, pulse 84, temperature 98.4 F (36.9 C), resp. rate 20, height 6' (1.829 m), weight 96.2 kg (212 lb), SpO2 97 %.Body mass index is 28.75  kg/m.  General Appearance: Fairly Groomed  Eye Contact:  Fair  Speech:  Clear and Coherent  Volume:  Normal  Mood:  Depressed  Affect:  Depressed and Flat  Thought Process:  Coherent  Orientation:  Full (Time, Place, and Person)  Thought Content:  Rumination  Suicidal Thoughts:  No  Homicidal Thoughts:  No  Memory:  Immediate;   Fair Recent;   Fair Remote;   Fair  Judgement:  Fair  Insight:  Fair  Psychomotor Activity:  Flacid  Concentration:  Concentration: Fair and Attention Span: Fair  Recall:  [employer]Fair  Fund of Knowledge:  Fair  Language:  Good  Akathisia:  No  Handed:  Right  AIMS (if indicated):     Assets:  Desire for Improvement  ADL's:  Intact  Cognition:  WNL  Sleep:  Number of Hours: 5.25   Treatment Plan Summary: Review of chart, vital signs, medications, and notes.  1-Individual and group therapy  2-Medication management for depression and anxiety: Medications reviewed with the patient.  For mood sx; Continue Abilify 7.5 mg po bid for mood control. Celexa 20 mg po daily for depression. Neurontin 1200 mg po tid for agitation.  For substance use do (cocaine) Refer to substance abuse treatment program.  For insomnia: ContinueTrazodone 300 mg po qhs.  Continue Mucinex 1200 mg for cough/chest congestion. Coping skills for depression, anxiety  Continue crisis stabilization and management  Address health issues--monitoring vital signs, stable  Treatment plan in progress to prevent relapse of depression and anxiety. Continue current plan of care  Sanjuana Kavawoko, Agnes I, NP PMHNP-BC 05/19/2016, 3:03 PMPatient ID: Billy FlakesMelvin K [place], male   DOB: 1960-09-23, 55 y.o.   MRN: [id number] Agree with NP Progress Note as above Nehemiah MassedFernando Chantae Soo, MD

## 2016-05-19 NOTE — Progress Notes (Signed)
Patient denies SI, HI and AVH this shift.  Patient had no complaints other than the somatic complaint of his back hurting.  Patient attended groups and was compliant with medications.   Assess patient for safety, offer medications as prescribed, engage patient in 1:1 staff talks.   Continue to monitor as prescribed.  Patient able to contract for safety.

## 2016-05-19 NOTE — Progress Notes (Signed)
Pt was invited to attend the evening AA speaker meeting, however pt stayed in his room in bed. Chalsea Darko C, NT 05/19/16 9:44 PM 

## 2016-05-19 NOTE — [facility] Group Notes (Signed)
[facility] LCSW Group Therapy  05/19/2016 3:32 PM  Type of Therapy:  Group Therapy  Participation Level:  Did Not Attend-pt invited. Chose to remain in bed.   Summary of Progress/Problems: Today's Topic: Overcoming Obstacles. Patients identified one short term goal and potential obstacles in reaching this goal. Patients processed barriers involved in overcoming these obstacles. Patients identified steps necessary for overcoming these obstacles and explored motivation (internal and external) for facing these difficulties head on.   Avanna Sowder N Smart LCSW 05/19/2016, 3:32 PM

## 2016-05-19 NOTE — Progress Notes (Signed)
Recreation Therapy Notes  Date: 05/19/16 Time: 0930 Location: 300 Dayroom  Group Topic: Stress Management  Goal Area(s) Addresses:  Patient will verbalize importance of using healthy stress management.  Patient will identify positive emotions associated with healthy stress management.   Intervention: Calm App  Activity :  Body Scan Meditation.  LRT introduced the stress management technique of meditation.  LRT played a body scan meditation from the Calm app to allow patients to participate in the technique.  Patients were to follow along to fully engage in the technique.  Education:  Stress Management, Discharge Planning.   Education Outcome: Acknowledges edcuation/In group clarification offered/Needs additional education  Clinical Observations/Feedback: Pt did not attend group.   Caroll RancherMarjette Detron Carras, LRT/CTRS         Lillia AbedLindsay, Alfonso Carden A 05/19/2016 11:25 AM

## 2016-05-20 LAB — GLUCOSE, CAPILLARY
GLUCOSE-CAPILLARY: 206 mg/dL — AB (ref 65–99)
Glucose-Capillary: 182 mg/dL — ABNORMAL HIGH (ref 65–99)
Glucose-Capillary: 192 mg/dL — ABNORMAL HIGH (ref 65–99)

## 2016-05-20 LAB — PROLACTIN: PROLACTIN: 10.8 ng/mL (ref 4.0–15.2)

## 2016-05-20 MED ORDER — TIZANIDINE HCL 2 MG PO TABS
4.0000 mg | ORAL_TABLET | Freq: Four times a day (QID) | ORAL | Status: DC | PRN
  Administered 2016-05-20 – 2016-05-22 (×3): 4 mg via ORAL
  Filled 2016-05-20 (×4): qty 2

## 2016-05-20 NOTE — [facility] Group Notes (Signed)
[facility] LCSW Group Therapy  05/20/2016 2:00 PM  Type of Therapy:  Group Therapy  Participation Level:  Did Not Attend-pt invited. Chose to remain in bed.   Summary of Progress/Problems: MHA Speaker came to talk about his personal journey with substance abuse and addiction. The pt processed ways by which to relate to the speaker. MHA speaker provided handouts and educational information pertaining to groups and services offered by the [facility].   Peyten Punches N Smart LCSW 05/20/2016, 2:00 PM

## 2016-05-20 NOTE — Progress Notes (Signed)
Patient ID: Billy [name], male   DOB: [birth year], 55 y.o.   MRN: [id number] D ---  Pt agrees to contract for safety and denies pain.  He maintains a flat, somber affect but is friendly and cooperative with staff.  He attends groups and shows strong knowledge in his Diabetic issues.  Pt takes medications as asked and shows no sign of any adverse effects.  ---  A  ---  Support and encouragement provided  --- R --  Pt remain safe on unit

## 2016-05-20 NOTE — Progress Notes (Signed)
[facility] MD Progress Note  05/20/2016 2:29 PM Billy [name]  MRN:  [id number]  Subjective: Billy [name] reports, "I'm doing so-so & tired".   Objective:  Billy [name] is seen, chart reviewed. He is lying down in his bed again today. He is alert, oriented x 3 & aware of situation. He is reporting decreasing symptoms of depression. He reports that his mood seems to be stabilizing. He continues to endorse some depressive symptoms. He is cooperative with assessment and appears to be interacting well on the unit. He denies any adverse reactions to medications and is compliant with his regimen. He is not participating much in the group milieu. He is encouraged to participate in the group sessions. He currently denies any SIHI, AVH. Denies any substance withdrawal symptoms.  Principal Problem: Bipolar disorder, curr episode mixed, severe, w/o psychotic features (HCC) Diagnosis:   Patient Active Problem List   Diagnosis Date Noted  . Bipolar disorder, curr episode mixed, severe, w/o psychotic features (HCC) [F31.63] 05/17/2016  . Cocaine use disorder, severe, dependence (HCC) [F14.20] 05/17/2016  . Affective psychosis, bipolar (HCC) [F31.9]   . Bipolar affective disorder, depressed (HCC) [F31.30] 08/09/2015  . Bipolar disorder with current episode depressed (HCC) [F31.30] 08/09/2015  . Polysubstance abuse [F19.10]   . Type 2 diabetes mellitus with hyperglycemia (HCC) [E11.65] 09/19/2014  . Right leg DVT (HCC) [I82.401] 09/19/2014  . Essential hypertension [I10] 09/19/2014  . Bipolar affective disorder, depressed, severe, with psychotic behavior (HCC) [F31.5] 09/14/2014  . Chronic pain syndrome [G89.4] 09/14/2014  . Cocaine abuse [F14.10] 09/13/2014  . Alcohol use disorder, mild, abuse [F10.10] 10/12/2012  . Homeless [Z59.0] 09/02/2011  . Opiate abuse, episodic [F11.10] 09/02/2011  . Diabetes mellitus [250] 08/30/2011  . WPW (Wolff-Parkinson-White syndrome) [I45.6] 08/30/2011   Total Time spent with patient:  15 minutes  Past Psychiatric History: Hx. Alcoholism  Past Medical History:  Past Medical History:  Diagnosis Date  . Bipolar 1 disorder (HCC)   . Chronic pain   . Depression   . Diabetes mellitus   . Diabetes mellitus without complication (HCC)   . HCV (hepatitis C virus)   . Hepatitis C carrier (HCC)   . Homeless   . Hypertension   . Suicide attempt   . WPW (Wolff-Parkinson-White syndrome)     Past Surgical History:  Procedure Laterality Date  . BACK SURGERY    . CHEST SURGERY    . RHINOPLASTY    . TONSILLECTOMY     Family History:  Family History  Problem Relation Age of Onset  . Mental illness Neg Hx    Family Psychiatric  History: See H&P  Social History:  History  Alcohol Use  . Yes    Comment: last drink 24 days     History  Drug Use  . Types: Marijuana, "Crack" cocaine, Cocaine    Comment: last used 09/03/2014    Social History   Social History  . Marital status: Divorced    Spouse name: N/A  . Number of children: N/A  . Years of education: N/A   Social History Main Topics  . Smoking status: Current Every Day Smoker    Packs/day: 1.00    Years: 8.00    Types: Cigarettes  . Smokeless tobacco: Never Used  . Alcohol use Yes     Comment: last drink 24 days  . Drug use:     Types: Marijuana, "Crack" cocaine, Cocaine     Comment: last used 09/03/2014  . Sexual activity: Not Asked   Other  Topics Concern  . None   Social History Narrative   ** Merged History Encounter **       Additional Social History:   Sleep: Fair  Appetite:  Fair  Current Medications: Current Facility-Administered Medications  Medication Dose Route Frequency Provider Last Rate Last Dose  . acetaminophen (TYLENOL) tablet 650 mg  650 mg Oral Q6H PRN Sanjuana Kava, NP   650 mg at 05/17/16 0842  . albuterol (PROVENTIL HFA;VENTOLIN HFA) 108 (90 Base) MCG/ACT inhaler 2 puff  2 puff Inhalation Q6H PRN Sanjuana Kava, NP   2 puff at 05/17/16 [phone]  . alum & mag hydroxide-simeth  (MAALOX/MYLANTA) 200-200-20 MG/5ML suspension 30 mL  30 mL Oral Q4H PRN Sanjuana Kava, NP      . ARIPiprazole (ABILIFY) tablet 7.5 mg  7.5 mg Oral BID Sanjuana Kava, NP   7.5 mg at 05/20/16 0829  . canagliflozin (INVOKANA) tablet 300 mg  300 mg Oral Daily Sanjuana Kava, NP   300 mg at 05/20/16 9604  . citalopram (CELEXA) tablet 20 mg  20 mg Oral Daily Sanjuana Kava, NP   20 mg at 05/20/16 0829  . gabapentin (NEURONTIN) tablet 1,200 mg  1,200 mg Oral TID Sanjuana Kava, NP   1,200 mg at 05/20/16 1201  . guaiFENesin (MUCINEX) 12 hr tablet 1,200 mg  1,200 mg Oral BID Jackelyn Poling, NP   1,200 mg at 05/20/16 0829  . hydrOXYzine (ATARAX/VISTARIL) tablet 50 mg  50 mg Oral QHS,MR X 1 Sanjuana Kava, NP   50 mg at 05/19/16 2315  . insulin aspart (novoLOG) injection 0-15 Units  0-15 Units Subcutaneous TID WC Sanjuana Kava, NP   3 Units at 05/20/16 1202  . insulin aspart (novoLOG) injection 4 Units  4 Units Subcutaneous TID WC Sanjuana Kava, NP   4 Units at 05/20/16 1206  . insulin glargine (LANTUS) injection 60 Units  60 Units Subcutaneous QHS Sanjuana Kava, NP   60 Units at 05/19/16 2216  . lisinopril (PRINIVIL,ZESTRIL) tablet 20 mg  20 mg Oral Daily Sanjuana Kava, NP   20 mg at 05/20/16 0829  . magnesium hydroxide (MILK OF MAGNESIA) suspension 30 mL  30 mL Oral Daily PRN Sanjuana Kava, NP      . nicotine (NICODERM CQ - dosed in mg/24 hours) patch 21 mg  21 mg Transdermal Q0600 Sanjuana Kava, NP   21 mg at 05/20/16 5409  . oxymetazoline (AFRIN) 0.05 % nasal spray 1 spray  1 spray Each Nare BID PRN Sanjuana Kava, NP   1 spray at 05/19/16 2211  . pantoprazole (PROTONIX) EC tablet 40 mg  40 mg Oral Daily Sanjuana Kava, NP   40 mg at 05/20/16 0829  . rivaroxaban (XARELTO) tablet 20 mg  20 mg Oral Daily Sanjuana Kava, NP   20 mg at 05/20/16 0829  . tiotropium (SPIRIVA) inhalation capsule 18 mcg  18 mcg Inhalation Daily Sanjuana Kava, NP   18 mcg at 05/20/16 0839  . tiZANidine (ZANAFLEX) tablet 4 mg  4 mg Oral Q8H  PRN Adonis Brook, NP   4 mg at 05/20/16 0636  . traZODone (DESYREL) tablet 300 mg  300 mg Oral QHS Jomarie Longs, MD   300 mg at 05/19/16 2212   Lab Results:  Results for orders placed or performed during the hospital encounter of 05/16/16 (from the past 48 hour(s))  Glucose, capillary     Status: Abnormal  Collection Time: 05/18/16  4:45 PM  Result Value Ref Range   Glucose-Capillary 212 (H) 65 - 99 mg/dL  Glucose, capillary     Status: Abnormal   Collection Time: 05/18/16  9:10 PM  Result Value Ref Range   Glucose-Capillary 168 (H) 65 - 99 mg/dL   Comment 1 Notify RN    Comment 2 Document in Chart   Glucose, capillary     Status: Abnormal   Collection Time: 05/19/16  6:05 AM  Result Value Ref Range   Glucose-Capillary 192 (H) 65 - 99 mg/dL  Prolactin     Status: None   Collection Time: 05/19/16  6:18 AM  Result Value Ref Range   Prolactin 10.8 4.0 - 15.2 ng/mL    Comment: (NOTE) Performed At: [facility] [address] [place], [place] [id number] Mila HomerHancock William F MD WU:[id number]:[phone] Performed at [facility]   TSH     Status: Abnormal   Collection Time: 05/19/16  6:18 AM  Result Value Ref Range   TSH 0.229 (L) 0.350 - 4.500 uIU/mL    Comment: Performed by a 3rd Generation assay with a functional sensitivity of <=0.01 uIU/mL. Performed at [facility]   Glucose, capillary     Status: Abnormal   Collection Time: 05/19/16  5:29 PM  Result Value Ref Range   Glucose-Capillary 257 (H) 65 - 99 mg/dL   Comment 1 Notify RN    Comment 2 Document in Chart   Glucose, capillary     Status: Abnormal   Collection Time: 05/19/16  9:22 PM  Result Value Ref Range   Glucose-Capillary 208 (H) 65 - 99 mg/dL   Comment 1 Notify RN   Glucose, capillary     Status: Abnormal   Collection Time: 05/20/16  5:59 AM  Result Value Ref Range   Glucose-Capillary 182 (H) 65 - 99 mg/dL  Glucose, capillary     Status: Abnormal   Collection Time: 05/20/16  11:52 AM  Result Value Ref Range   Glucose-Capillary 192 (H) 65 - 99 mg/dL   Comment 1 Notify RN    Comment 2 Document in Chart    Blood Alcohol level:  Lab Results  Component Value Date   ETH 71 (H) 05/15/2016   ETH <5 08/08/2015   Metabolic Disorder Labs: Lab Results  Component Value Date   HGBA1C 8.7 (H) 05/17/2016   MPG 203 05/17/2016   MPG 146 09/14/2014   Lab Results  Component Value Date   PROLACTIN 10.8 05/19/2016   Lab Results  Component Value Date   CHOL 202 (H) 05/17/2016   TRIG 211 (H) 05/17/2016   HDL 38 (L) 05/17/2016   CHOLHDL 5.3 05/17/2016   VLDL 42 (H) 05/17/2016   LDLCALC 122 (H) 05/17/2016    Physical Findings: AIMS: Facial and Oral Movements Muscles of Facial Expression: None, normal Lips and Perioral Area: None, normal Jaw: None, normal Tongue: None, normal,Extremity Movements Upper (arms, wrists, hands, fingers): None, normal Lower (legs, knees, ankles, toes): None, normal, Trunk Movements Neck, shoulders, hips: None, normal, Overall Severity Severity of abnormal movements (highest score from questions above): None, normal Incapacitation due to abnormal movements: None, normal Patient's awareness of abnormal movements (rate only patient's report): No Awareness, Dental Status Current problems with teeth and/or dentures?: No Does patient usually wear dentures?: No  CIWA:  CIWA-Ar Total: 0 COWS:     Musculoskeletal: Strength & Muscle Tone: within normal limits Gait & Station: normal Patient leans: N/A  Psychiatric Specialty  Exam: Physical Exam  Nursing note and vitals reviewed.   Review of Systems  Constitutional: Negative.   HENT: Negative.   Eyes: Negative.   Respiratory: Positive for cough and shortness of breath.   Cardiovascular: Negative.   Gastrointestinal: Negative.   Genitourinary: Negative.   Musculoskeletal: Negative.   Skin: Negative.   Neurological: Negative.   Endo/Heme/Allergies: Negative.    Psychiatric/Behavioral: Positive for depression ("Improving") and substance abuse (Hx. alcoholism). Negative for hallucinations, memory loss and suicidal ideas. The patient is nervous/anxious ("Improving") and has insomnia ("Improving"  ).   All other systems reviewed and are negative.   Blood pressure 100/62, pulse 95, temperature 98.5 F (36.9 C), temperature source Oral, resp. rate 18, height 6' (1.829 m), weight 96.2 kg (212 lb), SpO2 97 %.Body mass index is 28.75 kg/m.  General Appearance: Fairly Groomed  Eye Contact:  Fair  Speech:  Clear and Coherent  Volume:  Normal  Mood:  Depressed  Affect:  Depressed and Flat  Thought Process:  Coherent  Orientation:  Full (Time, Place, and Person)  Thought Content:  Rumination  Suicidal Thoughts:  No  Homicidal Thoughts:  No  Memory:  Immediate;   Fair Recent;   Fair Remote;   Fair  Judgement:  Fair  Insight:  Fair  Psychomotor Activity:  Flacid  Concentration:  Concentration: Fair and Attention Span: Fair  Recall:  FiservFair  Fund of Knowledge:  Fair  Language:  Good  Akathisia:  No  Handed:  Right  AIMS (if indicated):     Assets:  Desire for Improvement  ADL's:  Intact  Cognition:  WNL  Sleep:  Number of Hours: 6   Treatment Plan Summary: Review of chart, vital signs, medications, and notes.  1-Individual and group therapy  2-Medication management for depression and anxiety: Medications reviewed with the patient.  For mood sx; Continue Abilify 7.5 mg po bid for mood control. Celexa 20 mg po daily for depression. Neurontin 1200 mg po tid for agitation.  For substance use do (cocaine) Refer to substance abuse treatment program.  For insomnia: ContinueTrazodone 300 mg po qhs.  Continue Mucinex 1200 mg for cough/chest congestion. Coping skills for depression, anxiety  Continue crisis stabilization and management  Address health issues--monitoring vital signs, stable  Treatment plan in progress to prevent relapse of  depression and anxiety. Continue current plan of care  Sanjuana KavaNwoko, Mickey Esguerra I, NP PMHNP-BC 05/20/2016, 2:29 PMPatient ID: Billy [name], male   DOB: [birth year], 55 y.o.   MRN: [id number] Patient ID: Billy [name], male   DOB: [birth year], 55 y.o.   MRN: [id number]

## 2016-05-20 NOTE — Progress Notes (Signed)
Patient ID: Billy [name], male   DOB: May 28, 1961, 55 y.o.   MRN: [id number]  Pt currently presents with a flat affect and anxious, cooperative behavior. Pt reports to Clinical research associatewriter that their goal is to "go home." Interaction with patient is superficial, forwards little to Clinical research associatewriter. Pt presents more guarded than during other admissions. Pt reports good sleep with current medication regimen. Remains in bed for most of the night.   Pt provided with medications per providers orders. Pt's labs and vitals were monitored throughout the night. Pt supported emotionally and encouraged to express concerns and questions. Pt educated on medications.  Pt's safety ensured with 15 minute and environmental checks. Pt currently denies SI/HI and A/V hallucinations. Pt verbally agrees to seek staff if SI/HI or A/VH occurs and to consult with staff before acting on any harmful thoughts. Will continue POC.

## 2016-05-20 NOTE — Plan of Care (Signed)
Problem: Safety: Goal: Periods of time without injury will increase Outcome: Progressing Pt. denies SI/HI/AVH at this time, remains a low fall risk, Q 15 checks in place for safety.    

## 2016-05-20 NOTE — Progress Notes (Signed)
Patient ID: Billy [name], male   DOB: Mar 18, 1961, 55 y.o.   MRN: [id number] PER STATE REGULATIONS 482.30  THIS CHART WAS REVIEWED FOR MEDICAL NECESSITY WITH RESPECT TO THE PATIENT'S ADMISSION/ DURATION OF STAY.  NEXT REVIEW DATE: 05/24/2016  Willa RoughJENNIFER JONES Suzanne Garbers, RN, BSN CASE MANAGER

## 2016-05-20 NOTE — Progress Notes (Addendum)
  DATA ACTION RESPONSE  Objective- Pt. is up and visible in his room, resting in bed. Pt. presents with an animated affect and mood. Pt. appears to be isolative from the milieu. Subjective- Denies having any SI/HI/AVH/Pain at this time. Pt. states "I am going home tomorrow". Pt. states he feels ready. Pt. continues to be cooperative and pleasant on the unit.   1:1 interaction in private to establish rapport. Encouragement, education, & support given from staff. Meds. ordered and administered. PRN Afrin and Zanaflex requested and given. Will re-eval as necessary. BS at bedtime was 206.   Safety maintained with Q 15 checks. Continues to follow treatment plan and will monitor closely. No additonal questions/concerns noted.

## 2016-05-21 LAB — GLUCOSE, CAPILLARY
GLUCOSE-CAPILLARY: 207 mg/dL — AB (ref 65–99)
GLUCOSE-CAPILLARY: 169 mg/dL — AB (ref 65–99)
GLUCOSE-CAPILLARY: 236 mg/dL — AB (ref 65–99)
Glucose-Capillary: 185 mg/dL — ABNORMAL HIGH (ref 65–99)
Glucose-Capillary: 210 mg/dL — ABNORMAL HIGH (ref 65–99)

## 2016-05-21 MED ORDER — HYDROXYZINE HCL 50 MG PO TABS: 50.0000 mg | ORAL_TABLET | Freq: Every evening | ORAL | 0 refills | Status: DC | PRN

## 2016-05-21 MED ORDER — TRAZODONE HCL 300 MG PO TABS: 300.0000 mg | ORAL_TABLET | Freq: Every day | ORAL | 0 refills | Status: DC

## 2016-05-21 MED ORDER — ARIPIPRAZOLE 15 MG PO TABS: 7.5000 mg | ORAL_TABLET | Freq: Two times a day (BID) | ORAL | 0 refills | Status: DC

## 2016-05-21 MED ORDER — GABAPENTIN 400 MG PO CAPS: 1200.0000 mg | ORAL_CAPSULE | Freq: Three times a day (TID) | ORAL | 0 refills | Status: DC

## 2016-05-21 MED ORDER — NICOTINE 21 MG/24HR TD PT24: 21.0000 mg | MEDICATED_PATCH | Freq: Every day | TRANSDERMAL | 0 refills | Status: DC

## 2016-05-21 MED ORDER — INSULIN GLARGINE 100 UNIT/ML ~~LOC~~ SOLN: 60.0000 [IU] | Freq: Every day | SUBCUTANEOUS | 0 refills | Status: DC

## 2016-05-21 MED ORDER — ALBUTEROL SULFATE HFA 108 (90 BASE) MCG/ACT IN AERS: 2.0000 | INHALATION_SPRAY | Freq: Four times a day (QID) | RESPIRATORY_TRACT | 0 refills | Status: DC | PRN

## 2016-05-21 MED ORDER — XARELTO 20 MG PO TABS: 20.0000 mg | ORAL_TABLET | Freq: Every day | ORAL | 0 refills | Status: DC

## 2016-05-21 MED ORDER — LISINOPRIL 20 MG PO TABS: 20.0000 mg | ORAL_TABLET | Freq: Every day | ORAL | 0 refills | Status: DC

## 2016-05-21 MED ORDER — TIOTROPIUM BROMIDE MONOHYDRATE 18 MCG IN CAPS: 18.0000 ug | ORAL_CAPSULE | Freq: Every day | RESPIRATORY_TRACT | 12 refills | Status: DC

## 2016-05-21 MED ORDER — OXYMETAZOLINE HCL 0.05 % NA SOLN: 1.0000 | Freq: Two times a day (BID) | NASAL | 0 refills | Status: DC | PRN

## 2016-05-21 MED ORDER — CITALOPRAM HYDROBROMIDE 20 MG PO TABS: 20.0000 mg | ORAL_TABLET | Freq: Every day | ORAL | 0 refills | Status: DC

## 2016-05-21 MED ORDER — INVOKANA 300 MG PO TABS: 300.0000 mg | ORAL_TABLET | Freq: Every day | ORAL | 0 refills | Status: DC

## 2016-05-21 MED ORDER — OMEPRAZOLE 20 MG PO CPDR: 40.0000 mg | DELAYED_RELEASE_CAPSULE | Freq: Every day | ORAL | Status: DC

## 2016-05-21 MED ORDER — TRAZODONE HCL 100 MG PO TABS
200.0000 mg | ORAL_TABLET | Freq: Every day | ORAL | Status: DC
  Administered 2016-05-21: 200 mg via ORAL
  Filled 2016-05-21 (×4): qty 2

## 2016-05-21 MED ORDER — TIZANIDINE HCL 2 MG PO TABS: 4.0000 mg | ORAL_TABLET | Freq: Three times a day (TID) | ORAL | 0 refills | Status: DC

## 2016-05-21 NOTE — Progress Notes (Signed)
[facility] MD Progress Note  05/21/2016 5:03 PM KENDREW PACI  MRN:  [id number]  Subjective: Patient reports he is feeling more depressed today, and reports passive SI. He states he does not feel safe to discharge today, and states " I really think I need a little bit longer, I just don't feel right to discharge today". He does contract for safety on unit , and states he feels safe in inpatient environment. Denies medication side effects.   Objective:  I have discussed case with treatment team and have met with patient. Patient reports, as above, feeling more depressed and apprehensive about discharging today.  As discussed with staff, was scheduled for discharge today. Patient is tolerating medications well, denies side effects, and does state medications seem to be helping . Describes some cravings for cocaine , but states he is motivated in sobriety. As noted reports passive SI and feeling fearful of discharging , but contracts for safety on unit. No disruptive or agitated behaviors on unit, limited milieu participation .  Principal Problem: Bipolar disorder, curr episode mixed, severe, w/o psychotic features ([place]) Diagnosis:   Patient Active Problem List   Diagnosis Date Noted  . Bipolar disorder, curr episode mixed, severe, w/o psychotic features ([place]) [F31.63] 05/17/2016  . Cocaine use disorder, severe, dependence ([place]) [F14.20] 05/17/2016  . Affective psychosis, bipolar ([place]) [F31.9]   . Bipolar affective disorder, depressed ([place]) [F31.30] 08/09/2015  . Bipolar disorder with current episode depressed ([place]) [F31.30] 08/09/2015  . Polysubstance abuse [F19.10]   . Type 2 diabetes mellitus with hyperglycemia ([place]) [E11.65] 09/19/2014  . Right leg DVT ([place]) [I82.401] 09/19/2014  . Essential hypertension [I10] 09/19/2014  . Bipolar affective disorder, depressed, severe, with psychotic behavior ([place]) [F31.5] 09/14/2014  . Chronic pain syndrome [G89.4] 09/14/2014  . Cocaine abuse [F14.10]  09/13/2014  . Alcohol use disorder, mild, abuse [F10.10] 10/12/2012  . Homeless [Z59.0] 09/02/2011  . Opiate abuse, episodic [F11.10] 09/02/2011  . Diabetes mellitus [250] 08/30/2011  . WPW (Wolff-Parkinson-White syndrome) [I45.6] 08/30/2011   Total Time spent with patient: 20 minutes   Past Psychiatric History: Hx. Alcoholism  Past Medical History:  Past Medical History:  Diagnosis Date  . Bipolar 1 disorder ([place])   . Chronic pain   . Depression   . Diabetes mellitus   . Diabetes mellitus without complication ([place])   . HCV (hepatitis C virus)   . Hepatitis C carrier ([place])   . Homeless   . Hypertension   . Suicide attempt   . WPW (Wolff-Parkinson-White syndrome)     Past Surgical History:  Procedure Laterality Date  . BACK SURGERY    . CHEST SURGERY    . RHINOPLASTY    . TONSILLECTOMY     Family History:  Family History  Problem Relation Age of Onset  . Mental illness Neg Hx    Family Psychiatric  History: See H&P  Social History:  History  Alcohol Use  . Yes    Comment: last drink 24 days     History  Drug Use  . Types: Marijuana, "Crack" cocaine, Cocaine    Comment: last used 09/03/2014    Social History   Social History  . Marital status: Divorced    Spouse name: N/A  . Number of children: N/A  . Years of education: N/A   Social History Main Topics  . Smoking status: Current Every Day Smoker    Packs/day: 1.00    Years: 8.00    Types: Cigarettes  . Smokeless tobacco: Never  Used  . Alcohol use Yes     Comment: last drink 24 days  . Drug use:     Types: Marijuana, "Crack" cocaine, Cocaine     Comment: last used 09/03/2014  . Sexual activity: Not Asked   Other Topics Concern  . None   Social History Narrative   ** Merged History Encounter **       Additional Social History:   Sleep: Fair  Appetite:  Fair  Current Medications: Current Facility-Administered Medications  Medication Dose Route Frequency Provider Last Rate Last Dose  .  acetaminophen (TYLENOL) tablet 650 mg  650 mg Oral Q6H PRN Encarnacion Slates, NP   650 mg at 05/17/16 0842  . albuterol (PROVENTIL HFA;VENTOLIN HFA) 108 (90 Base) MCG/ACT inhaler 2 puff  2 puff Inhalation Q6H PRN Encarnacion Slates, NP   2 puff at 05/17/16 [phone]  . alum & mag hydroxide-simeth (MAALOX/MYLANTA) 200-200-20 MG/5ML suspension 30 mL  30 mL Oral Q4H PRN Encarnacion Slates, NP      . ARIPiprazole (ABILIFY) tablet 7.5 mg  7.5 mg Oral BID Encarnacion Slates, NP   7.5 mg at 05/21/16 0853  . canagliflozin (INVOKANA) tablet 300 mg  300 mg Oral Daily Encarnacion Slates, NP   300 mg at 05/21/16 0623  . citalopram (CELEXA) tablet 20 mg  20 mg Oral Daily Encarnacion Slates, NP   20 mg at 05/21/16 0853  . gabapentin (NEURONTIN) tablet 1,200 mg  1,200 mg Oral TID Encarnacion Slates, NP   1,200 mg at 05/21/16 1255  . guaiFENesin (MUCINEX) 12 hr tablet 1,200 mg  1,200 mg Oral BID Rozetta Nunnery, NP   1,200 mg at 05/21/16 0853  . hydrOXYzine (ATARAX/VISTARIL) tablet 50 mg  50 mg Oral QHS,MR X 1 Encarnacion Slates, NP   50 mg at 05/20/16 2245  . insulin aspart (novoLOG) injection 0-15 Units  0-15 Units Subcutaneous TID WC Encarnacion Slates, NP   5 Units at 05/21/16 1218  . insulin aspart (novoLOG) injection 4 Units  4 Units Subcutaneous TID WC Encarnacion Slates, NP   4 Units at 05/21/16 1219  . insulin glargine (LANTUS) injection 60 Units  60 Units Subcutaneous QHS Encarnacion Slates, NP   60 Units at 05/20/16 2237  . lisinopril (PRINIVIL,ZESTRIL) tablet 20 mg  20 mg Oral Daily Encarnacion Slates, NP   20 mg at 05/21/16 0855  . magnesium hydroxide (MILK OF MAGNESIA) suspension 30 mL  30 mL Oral Daily PRN Encarnacion Slates, NP      . nicotine (NICODERM CQ - dosed in mg/24 hours) patch 21 mg  21 mg Transdermal Q0600 Encarnacion Slates, NP   21 mg at 05/21/16 7628  . oxymetazoline (AFRIN) 0.05 % nasal spray 1 spray  1 spray Each Nare BID PRN Encarnacion Slates, NP   1 spray at 05/20/16 2244  . pantoprazole (PROTONIX) EC tablet 40 mg  40 mg Oral Daily Encarnacion Slates, NP   40 mg at  05/21/16 0853  . rivaroxaban (XARELTO) tablet 20 mg  20 mg Oral Daily Encarnacion Slates, NP   20 mg at 05/21/16 3151  . tiotropium (SPIRIVA) inhalation capsule 18 mcg  18 mcg Inhalation Daily Encarnacion Slates, NP   18 mcg at 05/21/16 0851  . tiZANidine (ZANAFLEX) tablet 4 mg  4 mg Oral Q6H PRN Ursula Alert, MD   4 mg at 05/20/16 2313  . traZODone (DESYREL) tablet 300 mg  300  mg Oral QHS Ursula Alert, MD   300 mg at 05/20/16 2239   Lab Results:  Results for orders placed or performed during the hospital encounter of 05/16/16 (from the past 48 hour(s))  Glucose, capillary     Status: Abnormal   Collection Time: 05/19/16  5:29 PM  Result Value Ref Range   Glucose-Capillary 257 (H) 65 - 99 mg/dL   Comment 1 Notify RN    Comment 2 Document in Chart   Glucose, capillary     Status: Abnormal   Collection Time: 05/19/16  9:22 PM  Result Value Ref Range   Glucose-Capillary 208 (H) 65 - 99 mg/dL   Comment 1 Notify RN   Glucose, capillary     Status: Abnormal   Collection Time: 05/20/16  5:59 AM  Result Value Ref Range   Glucose-Capillary 182 (H) 65 - 99 mg/dL  Glucose, capillary     Status: Abnormal   Collection Time: 05/20/16 11:52 AM  Result Value Ref Range   Glucose-Capillary 192 (H) 65 - 99 mg/dL   Comment 1 Notify RN    Comment 2 Document in Chart   Glucose, capillary     Status: Abnormal   Collection Time: 05/20/16  4:54 PM  Result Value Ref Range   Glucose-Capillary 185 (H) 65 - 99 mg/dL   Comment 1 Notify RN    Comment 2 Document in Chart   Glucose, capillary     Status: Abnormal   Collection Time: 05/20/16  8:27 PM  Result Value Ref Range   Glucose-Capillary 206 (H) 65 - 99 mg/dL  Glucose, capillary     Status: Abnormal   Collection Time: 05/21/16  6:08 AM  Result Value Ref Range   Glucose-Capillary 207 (H) 65 - 99 mg/dL  Glucose, capillary     Status: Abnormal   Collection Time: 05/21/16 12:14 PM  Result Value Ref Range   Glucose-Capillary 210 (H) 65 - 99 mg/dL   Comment 1  Notify RN    Blood Alcohol level:  Lab Results  Component Value Date   ETH 71 (H) 05/15/2016   ETH <5 29/47/6546   Metabolic Disorder Labs: Lab Results  Component Value Date   HGBA1C 8.7 (H) 05/17/2016   MPG 203 05/17/2016   MPG 146 09/14/2014   Lab Results  Component Value Date   PROLACTIN 10.8 05/19/2016   Lab Results  Component Value Date   CHOL 202 (H) 05/17/2016   TRIG 211 (H) 05/17/2016   HDL 38 (L) 05/17/2016   CHOLHDL 5.3 05/17/2016   VLDL 42 (H) 05/17/2016   LDLCALC 122 (H) 05/17/2016    Physical Findings: AIMS: Facial and Oral Movements Muscles of Facial Expression: None, normal Lips and Perioral Area: None, normal Jaw: None, normal Tongue: None, normal,Extremity Movements Upper (arms, wrists, hands, fingers): None, normal Lower (legs, knees, ankles, toes): None, normal, Trunk Movements Neck, shoulders, hips: None, normal, Overall Severity Severity of abnormal movements (highest score from questions above): None, normal Incapacitation due to abnormal movements: None, normal Patient's awareness of abnormal movements (rate only patient's report): No Awareness, Dental Status Current problems with teeth and/or dentures?: No Does patient usually wear dentures?: No  CIWA:  CIWA-Ar Total: 0 COWS:     Musculoskeletal: Strength & Muscle Tone: within normal limits Gait & Station: normal Patient leans: N/A  Psychiatric Specialty Exam: Physical Exam  Nursing note and vitals reviewed.   Review of Systems  Constitutional: Negative.   HENT: Negative.   Eyes: Negative.   Respiratory:  Positive for cough and shortness of breath.   Cardiovascular: Negative.   Gastrointestinal: Negative.   Genitourinary: Negative.   Musculoskeletal: Negative.   Skin: Negative.   Neurological: Negative.   Endo/Heme/Allergies: Negative.   Psychiatric/Behavioral: Positive for depression ("Improving") and substance abuse (Hx. alcoholism). Negative for hallucinations, memory loss  and suicidal ideas. The patient is nervous/anxious ("Improving") and has insomnia ("Improving"  ).   All other systems reviewed and are negative.   Blood pressure (!) 93/59, pulse 98, temperature 99 F (37.2 C), temperature source Oral, resp. rate 16, height 6' (1.829 m), weight 96.2 kg (212 lb), SpO2 97 %.Body mass index is 28.75 kg/m.  General Appearance: Fairly Groomed  Eye Contact:  Fair  Speech:  Clear and Coherent  Volume:  Normal  Mood:  States he continues to feel depressed   Affect:  Constricted   Thought Process:  Linear   Orientation:  Full (Time, Place, and Person)  Thought Content:  Denies hallucinations, no delusions, not internally preoccupied   Suicidal Thoughts:  Reports passive SI today, but denies any current plan or intention of suicide or of hurting self and contracts for safety on the unit  Homicidal Thoughts:  Denies homicidal ideations  Memory: recent and remote grossly intact   Judgement:  Fair  Insight:  Fair  Psychomotor Activity:  Decreased   Concentration:  Concentration: Good and Attention Span: Good  Recall:  Good  Fund of Knowledge:  Good  Language:  Good  Akathisia:  No  Handed:  Right  AIMS (if indicated):     Assets:  Desire for Improvement  ADL's:  Intact  Cognition:  WNL  Sleep:  Number of Hours: 6    Assessment - patient was scheduled by treatment team for discharge today, and it is felt he has made progress compared to admission. Patient does endorse medications are well tolerated and seem to be helping , but today  is reporting feeling more depressed, unsafe to discharge, with passive SI. He is motivated in sobriety but has been experiencing some cravings for cocaine, which may be contributing to reluctance/apprehension about discharging at this time Treatment Plan Summary: As discussed with staff, will cancel discharge and continue inpatient treatment and monitoring at this time Encourage increased group and milieu participation  Continue  Abilify 7.5 mg po BID for mood disorder   Continue Celexa 20 mg po QDAY  for depression. Continue Neurontin 1200 mg po TID for anxiety, pain Decrease Trazodone dose from 300 mgrs QHS to 200 mgrs QHS to minimize potential AM sedation , side effects Treatment team working on disposition planning options  Neita Garnet, MD PMHNP-BC 05/21/2016, 5:03 PMPatient ID: Billy [name], male   DOB: 1961-05-06, 55 y.o.   MRN: [id number] Patient ID: ARYAAN PERSICHETTI, male   DOB: [birth year], 55 y.o.   MRN: [id number] Patient ID: RHET RORKE, male   DOB: [birth year], 55 y.o.   MRN: [id number]

## 2016-05-21 NOTE — Tx Team (Signed)
Interdisciplinary Treatment and Diagnostic Plan Update  05/21/2016 Time of Session: 9:30AM Billy [name] MRN: [id number]  Principal Diagnosis: Bipolar disorder, curr episode mixed, severe, w/o psychotic features ([place])  Secondary Diagnoses: Principal Problem:   Bipolar disorder, curr episode mixed, severe, w/o psychotic features ([place]) Active Problems:   Type 2 diabetes mellitus with hyperglycemia (H[facility])   Cocaine use disorder, severe, dependence ([place])   Current Medications:  Current Facility-Administered Medications  Medication Dose Route Frequency Provider Last Rate Last Dose  . acetaminophen (TYLENOL) tablet 650 mg  650 mg Oral Q6H PRN Encarnacion Slates, NP   650 mg at 05/17/16 0842  . albuterol (PROVENTIL HFA;VENTOLIN HFA) 108 (90 Base) MCG/ACT inhaler 2 puff  2 puff Inhalation Q6H PRN Encarnacion Slates, NP   2 puff at 05/17/16 [phone]  . alum & mag hydroxide-simeth (MAALOX/MYLANTA) 200-200-20 MG/5ML suspension 30 mL  30 mL Oral Q4H PRN Encarnacion Slates, NP      . ARIPiprazole (ABILIFY) tablet 7.5 mg  7.5 mg Oral BID Encarnacion Slates, NP   7.5 mg at 05/21/16 0853  . canagliflozin (INVOKANA) tablet 300 mg  300 mg Oral Daily Encarnacion Slates, NP   300 mg at 05/21/16 2751  . citalopram (CELEXA) tablet 20 mg  20 mg Oral Daily Encarnacion Slates, NP   20 mg at 05/21/16 0853  . gabapentin (NEURONTIN) tablet 1,200 mg  1,200 mg Oral TID Encarnacion Slates, NP   1,200 mg at 05/21/16 7001  . guaiFENesin (MUCINEX) 12 hr tablet 1,200 mg  1,200 mg Oral BID Rozetta Nunnery, NP   1,200 mg at 05/21/16 0853  . hydrOXYzine (ATARAX/VISTARIL) tablet 50 mg  50 mg Oral QHS,MR X 1 Encarnacion Slates, NP   50 mg at 05/20/16 2245  . insulin aspart (novoLOG) injection 0-15 Units  0-15 Units Subcutaneous TID WC Encarnacion Slates, NP   5 Units at 05/21/16 [phone]  . insulin aspart (novoLOG) injection 4 Units  4 Units Subcutaneous TID WC Encarnacion Slates, NP   4 Units at 05/21/16 0617  . insulin glargine (LANTUS) injection 60 Units  60 Units Subcutaneous QHS  Encarnacion Slates, NP   60 Units at 05/20/16 2237  . lisinopril (PRINIVIL,ZESTRIL) tablet 20 mg  20 mg Oral Daily Encarnacion Slates, NP   20 mg at 05/21/16 0855  . magnesium hydroxide (MILK OF MAGNESIA) suspension 30 mL  30 mL Oral Daily PRN Encarnacion Slates, NP      . nicotine (NICODERM CQ - dosed in mg/24 hours) patch 21 mg  21 mg Transdermal Q0600 Encarnacion Slates, NP   21 mg at 05/21/16 4967  . oxymetazoline (AFRIN) 0.05 % nasal spray 1 spray  1 spray Each Nare BID PRN Encarnacion Slates, NP   1 spray at 05/20/16 2244  . pantoprazole (PROTONIX) EC tablet 40 mg  40 mg Oral Daily Encarnacion Slates, NP   40 mg at 05/21/16 0853  . rivaroxaban (XARELTO) tablet 20 mg  20 mg Oral Daily Encarnacion Slates, NP   20 mg at 05/21/16 5916  . tiotropium (SPIRIVA) inhalation capsule 18 mcg  18 mcg Inhalation Daily Encarnacion Slates, NP   18 mcg at 05/21/16 0851  . tiZANidine (ZANAFLEX) tablet 4 mg  4 mg Oral Q6H PRN Ursula Alert, MD   4 mg at 05/20/16 2313  . traZODone (DESYREL) tablet 300 mg  300 mg Oral QHS Ursula Alert, MD   300 mg at 05/20/16  2239   PTA Medications: Prescriptions Prior to Admission  Medication Sig Dispense Refill Last Dose  . albuterol (PROAIR HFA) 108 (90 Base) MCG/ACT inhaler Inhale 2 puffs into the lungs every 6 (six) hours as needed for shortness of breath. 1 Inhaler 0 Past Month at Unknown time  . ARIPiprazole (ABILIFY) 15 MG tablet Take 0.5 tablets (7.5 mg total) by mouth 2 (two) times daily. For mood control 30 tablet 0 Past Month at Unknown time  . citalopram (CELEXA) 20 MG tablet Take 1 tablet (20 mg total) by mouth daily. For depression 30 tablet 0 Past Month at Unknown time  . gabapentin (NEURONTIN) 400 MG capsule Take 3 capsules (1,200 mg total) by mouth 3 (three) times daily. For agitation 270 capsule 0 Past Month at Unknown time  . hydrOXYzine (ATARAX/VISTARIL) 50 MG tablet Take 1 tablet (50 mg total) by mouth at bedtime and may repeat dose one time if needed. For anxiety/insomnia 60 tablet 0 Past  Month at Unknown time  . insulin glargine (LANTUS) 100 UNIT/ML injection Inject 0.6 mLs (60 Units total) into the skin at bedtime. For diabetes managment (Patient taking differently: Inject 50 Units into the skin at bedtime. For diabetes managment) 10 mL 0 Past Week at Unknown time  . INVOKANA 300 MG TABS tablet Take 300 mg by mouth daily.  2 Past Month at Unknown time  . lisinopril (PRINIVIL,ZESTRIL) 20 MG tablet Take 1 tablet (20 mg total) by mouth daily. For high blood pressure 30 tablet 0 Past Month at Unknown time  . omeprazole (PRILOSEC) 20 MG capsule Take 2 capsules (40 mg total) by mouth daily. For acid reflux   05/14/2016 at Unknown time  . oxymetazoline (AFRIN) 0.05 % nasal spray Place 1 spray into both nostrils 2 (two) times daily as needed for congestion. 30 mL 0 05/15/2016 at Unknown time  . tiotropium (SPIRIVA) 18 MCG inhalation capsule Place 1 capsule (18 mcg total) into inhaler and inhale daily. For COPD (Patient taking differently: Place 18 mcg into inhaler and inhale 2 (two) times daily. For COPD) 30 capsule 12 05/15/2016 at Unknown time  . tiZANidine (ZANAFLEX) 2 MG tablet Take 2 tablets (4 mg total) by mouth 3 (three) times daily. For pain 1 tablet 0 Past Week at Unknown time  . traZODone (DESYREL) 300 MG tablet Take 1 tablet (300 mg total) by mouth at bedtime. For sleep 30 tablet 0 05/14/2016 at Unknown time  . XARELTO 20 MG TABS tablet Take 20 mg by mouth daily.   Past Month at Unknown time    Patient Stressors: Medication change or noncompliance Substance abuse  Patient Strengths: Average or above average intelligence Capable of independent living General fund of knowledge  Treatment Modalities: Medication Management, Group therapy, Case management,  1 to 1 session with clinician, Psychoeducation, Recreational therapy.   Physician Treatment Plan for Primary Diagnosis: Bipolar disorder, curr episode mixed, severe, w/o psychotic features ([place]) Long Term Goal(s):      Short Term Goals:    Medication Management: Evaluate patient's response, side effects, and tolerance of medication regimen.  Therapeutic Interventions: 1 to 1 sessions, Unit Group sessions and Medication administration.  Evaluation of Outcomes: Met  Physician Treatment Plan for Secondary Diagnosis: Principal Problem:   Bipolar disorder, curr episode mixed, severe, w/o psychotic features ([place]) Active Problems:   Type 2 diabetes mellitus with hyperglycemia ([facility])   Cocaine use disorder, severe, dependence ([place])  Long Term Goal(s):     Short Term Goals:  Medication Management: Evaluate patient's response, side effects, and tolerance of medication regimen.  Therapeutic Interventions: 1 to 1 sessions, Unit Group sessions and Medication administration.  Evaluation of Outcomes: Met   RN Treatment Plan for Primary Diagnosis: Bipolar disorder, curr episode mixed, severe, w/o psychotic features ([place]) Long Term Goal(s): Knowledge of disease and therapeutic regimen to maintain health will improve  Short Term Goals: Ability to remain free from injury will improve, Ability to disclose and discuss suicidal ideas and Ability to identify and develop effective coping behaviors will improve  Medication Management: RN will administer medications as ordered by provider, will assess and evaluate patient's response and provide education to patient for prescribed medication. RN will report any adverse and/or side effects to prescribing provider.  Therapeutic Interventions: 1 on 1 counseling sessions, Psychoeducation, Medication administration, Evaluate responses to treatment, Monitor vital signs and CBGs as ordered, Perform/monitor CIWA, COWS, AIMS and Fall Risk screenings as ordered, Perform wound care treatments as ordered.  Evaluation of Outcomes: Met   LCSW Treatment Plan for Primary Diagnosis: Bipolar disorder, curr episode mixed, severe, w/o psychotic features ([place]) Long Term Goal(s):  Safe transition to appropriate next level of care at discharge, Engage patient in therapeutic group addressing interpersonal concerns.  Short Term Goals: Engage patient in aftercare planning with referrals and resources, Facilitate patient progression through stages of change regarding substance use diagnoses and concerns and Identify triggers associated with mental health/substance abuse issues  Therapeutic Interventions: Assess for all discharge needs, 1 to 1 time with Social worker, Explore available resources and support systems, Assess for adequacy in community support network, Educate family and significant other(s) on suicide prevention, Complete Psychosocial Assessment, Interpersonal group therapy.  Evaluation of Outcomes: Met/adequate for discharge   Progress in Treatment: Attending groups: Yes. Participating in groups: Yes. Taking medication as prescribed: Yes. Toleration medication: Yes. Family/Significant other contact made: SPE completed with pt; pt declined to consent to family contact.  Patient understands diagnosis: Yes. Discussing patient identified problems/goals with staff: Yes. Medical problems stabilized or resolved: Yes. Denies suicidal/homicidal ideation: Yes. Issues/concerns per patient self-inventory: No. Other: n/a   New problem(s) identified: No, Describe:  n/a  New Short Term/Long Term Goal(s): medication stabilization; detox; development of comprehensive mental wellness/sobriety plan.   Discharge Plan or Barriers: Pt plans to return home; follow-up at [facility] for outpatient services. Pt also provided with AA list for [place] information.   Reason for Continuation of Hospitalization: none  Estimated Length of Stay: d/c today  Attendees: Patient: 05/21/2016 9:16 AM  Physician: Dr. Parke Poisson MD 05/21/2016 9:16 AM  Nursing: Cristal Generous RN 05/21/2016 9:16 AM  RN Care Manager: Lars Pinks CM  05/21/2016 9:16 AM  Social Worker: Maxie Better, LCSW; Erasmo Downer Drinkard LCSW; Adriana Reams LCSW 05/21/2016 9:16 AM  Recreational Therapist:  05/21/2016 9:16 AM  Other:  Lindell Spar NP 05/21/2016 9:16 AM  Other:  05/21/2016 9:16 AM  Other: 05/21/2016 9:16 AM    Scribe for Treatment Team: [place], LCSW 05/21/2016 9:16 AM

## 2016-05-21 NOTE — Progress Notes (Signed)
Patient ID: Billy FlakesMelvin K Manthe, male   DOB: 01/30/1961, 55 y.o.   MRN: [id number]  Pt currently presents with a flat affect and guarded behavior. Pt reports to Clinical research associatewriter that their goal is to "go home tomorrow." Pt states "I am going to live with a friend." Pt reports good sleep with current medication regimen. Pt exhibits intrusive and impulsive behavior.   Pt provided with medications per providers orders. Pt's labs and vitals were monitored throughout the night. Pt supported emotionally and encouraged to express concerns and questions. Pt educated on medications and proper insulin self administration.   Pt's safety ensured with 15 minute and environmental checks. Pt currently denies SI/HI and A/V hallucinations. Pt verbally agrees to seek staff if SI/HI or A/VH occurs and to consult with staff before acting on any harmful thoughts. Will continue POC.

## 2016-05-21 NOTE — Progress Notes (Signed)
  [facility] Discharge [facility] :  Will you be returning to the same living situation after discharge:  Yes,  home At discharge, do you have transportation home?: Yes,  gta bus pass in chart Do you have the ability to pay for your medications: Yes,  Medicare  Release of information consent forms completed and submitted to medical records by CSW.  Patient to Follow up at: Follow-up [employer]    [facility]. Go on 05/20/2016.   Specialty:  Professional Counselor Why:  Please walk in within 3 days of hospital discharge to be seen for hospital follow-up (walk in hours are Mon-Fri 8am-12pm). Appt could not be secured with Synetta FailAnita Jones-voicemail left by Social worker who will contact you directly with appt time/date.  Contact information: [facility] [address] [place] [address] [phone]           Next level of care provider has access to [facility] Link:no  Safety Planning and Suicide Prevention discussed: Yes,  SPE completed with pt; pt declined to consent to family contact. SPI pamphlet and Mobile Crisis information provided to pt.  Have you used any form of tobacco in the last 30 days? (Cigarettes, Smokeless Tobacco, Cigars, and/or Pipes): Yes  Has patient been referred to the Quitline?: Patient refused referral  Patient has been referred for addiction treatment: Yes  Ledell PeoplesHeather N Smart LCSW 05/21/2016, 9:13 AM

## 2016-05-21 NOTE — Progress Notes (Signed)
Patient denies HI and AVH.  Patient reported low mood and suicidal thoughts with no plan this shift.  Patient was able to contract for safety and had no self injurious behaviors this shift.   Assess patient for safety, offer medications as prescribed, engage patient in 1:1 staff talks.   Continue to monitor as prescribed.  Patient able to contract for safety.

## 2016-05-21 NOTE — Progress Notes (Signed)
Patient did not attend NA group meeting tonight.  

## 2016-05-21 NOTE — Progress Notes (Signed)
Recreation Therapy Notes  Date: 05/21/16 Time: 0930 Location: 300 Hall Group Room  Group Topic: Stress Management  Goal Area(s) Addresses:  Patient will verbalize importance of using healthy stress management.  Patient will identify positive emotions associated with healthy stress management.   Intervention: Guided Imagery  Activity :  Rainforest Imagery.  LRT introduced the stress management technique of guided imagery.  LRT read a script that took patients on a journey through the rainforest to allow patients to get the feeling of being in the middle of a rainforest.  Patients were to follow along as LRT read the script to engage in technique.    Education: Stress Management, Discharge Planning.   Education Outcome: Acknowledges edcuation/In group clarification offered/Needs additional education  Clinical Observations/Feedback: Pt did not attend group.   Vicent Febles, LRT/CTRS        Walfred Bettendorf A 05/21/2016 11:42 AM 
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
## 2016-05-22 LAB — GLUCOSE, CAPILLARY: GLUCOSE-CAPILLARY: 161 mg/dL — AB (ref 65–99)

## 2016-05-22 MED ORDER — TRAZODONE HCL 100 MG PO TABS: 200.0000 mg | ORAL_TABLET | Freq: Every day | ORAL | 0 refills | Status: DC
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
## 2016-05-22 NOTE — [facility] Group Notes (Signed)
The focus of this group is to educate the patient on the purpose and policies of crisis stabilization and provide a format to answer questions about their admission.  The group details unit policies and expectations of patients while admitted.  Patient did not attend 0900 nurse education orientation group this morning.  Patient stayed in room.  

## 2016-05-22 NOTE — Progress Notes (Signed)
  [facility] Discharge Plan :  Will you be returning to the same living situation after discharge:  Yes,  home At discharge, do you have transportation home?: Yes,  gta bus pass in chart Do you have the ability to pay for your medications: Yes,  Medicare  Release of information consent forms completed and submitted to medical records by CSW.  Patient to Follow up at: Follow-up [facility]. Go on 05/20/2016.   Specialty:  Professional Counselor Why:  Please walk in within 3 days of hospital discharge to be seen for hospital follow-up (walk in hours are Mon-Fri 8am-12pm). Appt could not be secured with Synetta FailAnita Jones-voicemail left by Social worker who will contact you directly with appt time/date.  Contact information: [facility] [address] [place] [address] [phone]           Next level of care provider has access to [facility] Link:no  Safety Planning and Suicide Prevention discussed: Yes,  SPE completed with pt; pt declined to consent to family contact. SPI pamphlet and Mobile Crisis information provided to pt.  Have you used any form of tobacco in the last 30 days? (Cigarettes, Smokeless Tobacco, Cigars, and/or Pipes): Yes  Has patient been referred to the Quitline?: Patient refused referral  Patient has been referred for addiction treatment: Yes  Andrewjames Weirauch N Smart LCSW 05/22/2016, 9:41 AM

## 2016-05-22 NOTE — Progress Notes (Signed)
Pt d/c from the hospital. All items returned. D/C instructions given, prescriptions given and bus pass given. Pt denies si and hi.

## 2016-11-24 ENCOUNTER — Inpatient Hospital Stay (HOSPITAL_COMMUNITY): Admission: AD | Admit: 2016-11-24 | Payer: 59 | Source: Intra-hospital | Admitting: Psychiatry

## 2016-11-24 ENCOUNTER — Emergency Department (HOSPITAL_COMMUNITY): Payer: Medicare Other

## 2016-11-24 ENCOUNTER — Emergency Department (HOSPITAL_COMMUNITY)
Admission: EM | Admit: 2016-11-24 | Discharge: 2016-11-26 | Disposition: A | Discharge status: Other Acute Inpatient Hospital | Payer: Medicare Other | Attending: Emergency Medicine | Admitting: Emergency Medicine

## 2016-11-24 ENCOUNTER — Encounter (HOSPITAL_COMMUNITY): Payer: Self-pay

## 2016-11-24 DIAGNOSIS — E119 Type 2 diabetes mellitus without complications: Secondary | ICD-10-CM | POA: Insufficient documentation

## 2016-11-24 DIAGNOSIS — T1491XA Suicide attempt, initial encounter: Secondary | ICD-10-CM | POA: Diagnosis present

## 2016-11-24 DIAGNOSIS — S92334A Nondisplaced fracture of third metatarsal bone, right foot, initial encounter for closed fracture: Secondary | ICD-10-CM | POA: Insufficient documentation

## 2016-11-24 DIAGNOSIS — Z79899 Other long term (current) drug therapy: Secondary | ICD-10-CM | POA: Insufficient documentation

## 2016-11-24 DIAGNOSIS — S92324A Nondisplaced fracture of second metatarsal bone, right foot, initial encounter for closed fracture: Secondary | ICD-10-CM | POA: Insufficient documentation

## 2016-11-24 DIAGNOSIS — Z794 Long term (current) use of insulin: Secondary | ICD-10-CM | POA: Insufficient documentation

## 2016-11-24 DIAGNOSIS — R55 Syncope and collapse: Secondary | ICD-10-CM | POA: Insufficient documentation

## 2016-11-24 DIAGNOSIS — Z7901 Long term (current) use of anticoagulants: Secondary | ICD-10-CM | POA: Insufficient documentation

## 2016-11-24 DIAGNOSIS — Y9389 Activity, other specified: Secondary | ICD-10-CM | POA: Insufficient documentation

## 2016-11-24 DIAGNOSIS — F17210 Nicotine dependence, cigarettes, uncomplicated: Secondary | ICD-10-CM | POA: Insufficient documentation

## 2016-11-24 DIAGNOSIS — I959 Hypotension, unspecified: Secondary | ICD-10-CM | POA: Insufficient documentation

## 2016-11-24 DIAGNOSIS — R45851 Suicidal ideations: Secondary | ICD-10-CM

## 2016-11-24 DIAGNOSIS — S92344A Nondisplaced fracture of fourth metatarsal bone, right foot, initial encounter for closed fracture: Secondary | ICD-10-CM | POA: Insufficient documentation

## 2016-11-24 DIAGNOSIS — F3130 Bipolar disorder, current episode depressed, mild or moderate severity, unspecified: Secondary | ICD-10-CM | POA: Diagnosis present

## 2016-11-24 DIAGNOSIS — W07XXXA Fall from chair, initial encounter: Secondary | ICD-10-CM | POA: Insufficient documentation

## 2016-11-24 DIAGNOSIS — F322 Major depressive disorder, single episode, severe without psychotic features: Secondary | ICD-10-CM

## 2016-11-24 DIAGNOSIS — Y929 Unspecified place or not applicable: Secondary | ICD-10-CM | POA: Insufficient documentation

## 2016-11-24 DIAGNOSIS — I10 Essential (primary) hypertension: Secondary | ICD-10-CM | POA: Insufficient documentation

## 2016-11-24 DIAGNOSIS — Z046 Encounter for general psychiatric examination, requested by authority: Secondary | ICD-10-CM | POA: Insufficient documentation

## 2016-11-24 DIAGNOSIS — Y999 Unspecified external cause status: Secondary | ICD-10-CM | POA: Insufficient documentation

## 2016-11-24 LAB — URINALYSIS, ROUTINE W REFLEX MICROSCOPIC
Bacteria, UA: NONE SEEN
Bilirubin Urine: NEGATIVE
Glucose, UA: >=500 mg/dL — AB
Hgb urine dipstick: NEGATIVE
Ketones, ur: NEGATIVE mg/dL
Leukocytes, UA: NEGATIVE
Nitrite: NEGATIVE
PROTEIN: NEGATIVE mg/dL
RBC / HPF: NONE SEEN RBC/hpf (ref 0–5)
SPECIFIC GRAVITY, URINE: 1.005 (ref 1.005–1.030)
SQUAMOUS EPITHELIAL / LPF: NONE SEEN
pH: 6 (ref 5.0–8.0)

## 2016-11-24 LAB — CBC WITH DIFFERENTIAL/PLATELET
BASOS ABS: 0 10*3/uL (ref 0.0–0.1)
Basophils Relative: 0 %
Eosinophils Absolute: 0.1 10*3/uL (ref 0.0–0.7)
Eosinophils Relative: 1 %
HCT: 42.5 % (ref 39.0–52.0)
HEMOGLOBIN: 15.2 g/dL (ref 13.0–17.0)
LYMPHS ABS: 2.4 10*3/uL (ref 0.7–4.0)
LYMPHS PCT: 25 %
MCH: 30.2 pg (ref 26.0–34.0)
MCHC: 35.8 g/dL (ref 30.0–36.0)
MCV: 84.5 fL (ref 78.0–100.0)
Monocytes Absolute: 0.7 10*3/uL (ref 0.1–1.0)
Monocytes Relative: 7 %
NEUTROS PCT: 67 %
Neutro Abs: 6.2 10*3/uL (ref 1.7–7.7)
Platelets: 141 10*3/uL — ABNORMAL LOW (ref 150–400)
RBC: 5.03 MIL/uL (ref 4.22–5.81)
RDW: 12.6 % (ref 11.5–15.5)
WBC: 9.4 10*3/uL (ref 4.0–10.5)

## 2016-11-24 LAB — COMPREHENSIVE METABOLIC PANEL WITH GFR
ALT: 10 U/L — ABNORMAL LOW (ref 17–63)
AST: 16 U/L (ref 15–41)
Albumin: 4.1 g/dL (ref 3.5–5.0)
Alkaline Phosphatase: 61 U/L (ref 38–126)
Anion gap: 9 (ref 5–15)
BUN: 11 mg/dL (ref 6–20)
CO2: 22 mmol/L (ref 22–32)
Calcium: 9.2 mg/dL (ref 8.9–10.3)
Chloride: 103 mmol/L (ref 101–111)
Creatinine, Ser: 1.08 mg/dL (ref 0.61–1.24)
GFR calc Af Amer: >60 mL/min
GFR calc non Af Amer: >60 mL/min
Glucose, Bld: 195 mg/dL — ABNORMAL HIGH (ref 65–99)
Potassium: 4.1 mmol/L (ref 3.5–5.1)
Sodium: 134 mmol/L — ABNORMAL LOW (ref 135–145)
Total Bilirubin: 1.4 mg/dL — ABNORMAL HIGH (ref 0.3–1.2)
Total Protein: 7.1 g/dL (ref 6.5–8.1)

## 2016-11-24 LAB — TROPONIN I: Troponin I: <0.03 ng/mL

## 2016-11-24 LAB — RAPID URINE DRUG SCREEN, HOSP PERFORMED
Amphetamines: NOT DETECTED
BARBITURATES: NOT DETECTED
Benzodiazepines: NOT DETECTED
Cocaine: NOT DETECTED
Opiates: NOT DETECTED
Tetrahydrocannabinol: NOT DETECTED

## 2016-11-24 LAB — CBG MONITORING, ED
GLUCOSE-CAPILLARY: 169 mg/dL — AB (ref 65–99)
Glucose-Capillary: 211 mg/dL — ABNORMAL HIGH (ref 65–99)

## 2016-11-24 LAB — SALICYLATE LEVEL: Salicylate Lvl: <7 mg/dL (ref 2.8–30.0)

## 2016-11-24 LAB — ACETAMINOPHEN LEVEL

## 2016-11-24 MED ORDER — TRAZODONE HCL 100 MG PO TABS
200.0000 mg | ORAL_TABLET | Freq: Every day | ORAL | Status: DC
  Administered 2016-11-24 – 2016-11-25 (×2): 200 mg via ORAL
  Filled 2016-11-24 (×2): qty 2

## 2016-11-24 MED ORDER — SODIUM CHLORIDE 0.9 % IV BOLUS (SEPSIS)
1000.0000 mL | Freq: Once | INTRAVENOUS | Status: AC
  Administered 2016-11-24: 1000 mL via INTRAVENOUS

## 2016-11-24 MED ORDER — ACETAMINOPHEN 325 MG PO TABS
650.0000 mg | ORAL_TABLET | ORAL | Status: DC | PRN
  Filled 2016-11-24: qty 2

## 2016-11-24 MED ORDER — CANAGLIFLOZIN 100 MG PO TABS
300.0000 mg | ORAL_TABLET | Freq: Every day | ORAL | Status: DC
  Administered 2016-11-24 – 2016-11-26 (×3): 300 mg via ORAL
  Filled 2016-11-24 (×3): qty 3

## 2016-11-24 MED ORDER — NICOTINE 21 MG/24HR TD PT24
21.0000 mg | MEDICATED_PATCH | Freq: Every day | TRANSDERMAL | Status: DC
  Administered 2016-11-24 – 2016-11-26 (×3): 21 mg via TRANSDERMAL
  Filled 2016-11-24 (×3): qty 1

## 2016-11-24 MED ORDER — CITALOPRAM HYDROBROMIDE 10 MG PO TABS
20.0000 mg | ORAL_TABLET | Freq: Every day | ORAL | Status: DC
  Administered 2016-11-24 – 2016-11-26 (×3): 20 mg via ORAL
  Filled 2016-11-24 (×3): qty 2

## 2016-11-24 MED ORDER — GABAPENTIN 400 MG PO CAPS
1200.0000 mg | ORAL_CAPSULE | Freq: Three times a day (TID) | ORAL | Status: DC
  Administered 2016-11-24 – 2016-11-26 (×7): 1200 mg via ORAL
  Filled 2016-11-24 (×7): qty 3

## 2016-11-24 MED ORDER — CLONIDINE HCL 0.1 MG PO TABS
0.2000 mg | ORAL_TABLET | Freq: Two times a day (BID) | ORAL | Status: DC
  Administered 2016-11-24 – 2016-11-26 (×4): 0.2 mg via ORAL
  Filled 2016-11-24 (×4): qty 2

## 2016-11-24 MED ORDER — FLUTICASONE PROPIONATE 50 MCG/ACT NA SUSP
1.0000 | Freq: Every day | NASAL | Status: DC
  Administered 2016-11-24 – 2016-11-26 (×3): 1 via NASAL
  Filled 2016-11-24 (×3): qty 16

## 2016-11-24 MED ORDER — SODIUM CHLORIDE 0.9 % IV SOLN
INTRAVENOUS | Status: DC
  Administered 2016-11-24: 14:00:00 via INTRAVENOUS

## 2016-11-24 MED ORDER — TIOTROPIUM BROMIDE MONOHYDRATE 18 MCG IN CAPS
18.0000 ug | ORAL_CAPSULE | Freq: Every day | RESPIRATORY_TRACT | Status: DC
  Administered 2016-11-24: 18 ug via RESPIRATORY_TRACT
  Filled 2016-11-24: qty 5

## 2016-11-24 MED ORDER — ONDANSETRON HCL 4 MG PO TABS: 4.0000 mg | ORAL_TABLET | Freq: Three times a day (TID) | ORAL | Status: DC | PRN

## 2016-11-24 MED ORDER — PANTOPRAZOLE SODIUM 40 MG PO TBEC
40.0000 mg | DELAYED_RELEASE_TABLET | Freq: Every day | ORAL | Status: DC
  Administered 2016-11-24 – 2016-11-26 (×3): 40 mg via ORAL
  Filled 2016-11-24 (×3): qty 1

## 2016-11-24 MED ORDER — ZOLPIDEM TARTRATE 5 MG PO TABS
5.0000 mg | ORAL_TABLET | Freq: Every evening | ORAL | Status: DC | PRN
  Administered 2016-11-25: 5 mg via ORAL
  Filled 2016-11-24: qty 1

## 2016-11-24 MED ORDER — ARIPIPRAZOLE 5 MG PO TABS
7.5000 mg | ORAL_TABLET | Freq: Two times a day (BID) | ORAL | Status: DC
  Administered 2016-11-24 – 2016-11-26 (×5): 7.5 mg via ORAL
  Filled 2016-11-24 (×5): qty 2

## 2016-11-24 MED ORDER — ALBUTEROL SULFATE HFA 108 (90 BASE) MCG/ACT IN AERS: 2.0000 | INHALATION_SPRAY | Freq: Four times a day (QID) | RESPIRATORY_TRACT | Status: DC | PRN

## 2016-11-24 MED ORDER — RIVAROXABAN 20 MG PO TABS
20.0000 mg | ORAL_TABLET | Freq: Every day | ORAL | Status: DC
  Administered 2016-11-24 – 2016-11-25 (×2): 20 mg via ORAL
  Filled 2016-11-24 (×3): qty 1

## 2016-11-24 MED ORDER — HYDROXYZINE HCL 25 MG PO TABS
50.0000 mg | ORAL_TABLET | Freq: Every evening | ORAL | Status: DC | PRN
  Administered 2016-11-24 – 2016-11-25 (×2): 50 mg via ORAL
  Filled 2016-11-24 (×2): qty 2

## 2016-11-24 MED ORDER — IBUPROFEN 200 MG PO TABS
600.0000 mg | ORAL_TABLET | Freq: Three times a day (TID) | ORAL | Status: DC | PRN
  Administered 2016-11-24 – 2016-11-26 (×2): 600 mg via ORAL
  Filled 2016-11-24 (×2): qty 3

## 2016-11-24 MED ORDER — ALUM & MAG HYDROXIDE-SIMETH 200-200-20 MG/5ML PO SUSP: 30.0000 mL | Freq: Four times a day (QID) | ORAL | Status: DC | PRN

## 2016-11-24 MED ORDER — INSULIN GLARGINE 100 UNIT/ML ~~LOC~~ SOLN
60.0000 [IU] | Freq: Every day | SUBCUTANEOUS | Status: DC
  Administered 2016-11-24 – 2016-11-25 (×2): 60 [IU] via SUBCUTANEOUS
  Filled 2016-11-24 (×4): qty 0.6

## 2016-11-24 NOTE — ED Notes (Signed)
Bed: [id number] Expected date:  Expected time:  Means of arrival:  Comments: EMS 50s, syncope
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
## 2016-11-24 NOTE — ED Notes (Signed)
ED Provider at bedside. EDP [place] UPDATING PT AND [place] SANDWICH GIVEN

## 2016-11-24 NOTE — ED Notes (Signed)
CHARGE RACHAL RN, Fish farm managerHOUSE AC RN AWARE OF NEED FOR SITTER AND PT'S RISK

## 2016-11-24 NOTE — ED Provider Notes (Signed)
[facility]-EM[facility]ENCY DEPT Provider Note   CSN: [id number] Arrival date & time: 11/24/16  1229     History   Chief Complaint Chief Complaint  Patient presents with  . Hypotension  . Fall  . Suicidal  . Foot Pain    HPI Billy [name] is a 56 y.o. male.  Pt presents to the ED today with depression/si, syncope, and right foot pain.  Pt said he has not been taking his psych meds for a month because they did not work.  He has been feeling depressed and has not been eating much.  He has had several syncopal episodes this week.  He said that he decided to kill himself last night.  He was standing on a chair tying a noose when he felt lightheaded, passed out, fell off the chair and hurt his right foot.  He never attempted the actual hanging.  He denies any other injuries.  He has been unable to put any weight on it since then.    The pt called EMS today when he felt like he was going to pass out again.  He was hypotensive when EMS arrived.  They gave him IVFs which did help bp.  Pt does have bp meds which he denied taking.  He does have a hx of cocaine abuse, but said he last used 1 week ago.  He denies etoh.      Past Medical History:  Diagnosis Date  . Bipolar 1 disorder (HCC)   . Chronic pain   . Depression   . Diabetes mellitus   . Diabetes mellitus without complication (HCC)   . HCV (hepatitis C virus)   . Hepatitis C carrier (HCC)   . Homeless   . Hypertension   . Suicide attempt (HCC)   . WPW (Wolff-Parkinson-White syndrome)     Patient Active Problem List   Diagnosis Date Noted  . Bipolar disorder, curr episode mixed, severe, w/o psychotic features (HCC) 05/17/2016  . Cocaine use disorder, severe, dependence (HCC) 05/17/2016  . Affective psychosis, bipolar (HCC)   . Bipolar affective disorder, depressed (HCC) 08/09/2015  . Bipolar disorder with current episode depressed (HCC) 08/09/2015  . Polysubstance abuse   . Type 2 diabetes mellitus with hyperglycemia (HCC)  09/19/2014  . Right leg DVT (HCC) 09/19/2014  . Essential hypertension 09/19/2014  . Bipolar affective disorder, depressed, severe, with psychotic behavior (HCC) 09/14/2014  . Chronic pain syndrome 09/14/2014  . Cocaine abuse 09/13/2014  . Alcohol use disorder, mild, abuse 10/12/2012  . Homeless 09/02/2011  . Opiate abuse, episodic 09/02/2011  . Diabetes mellitus 08/30/2011  . WPW (Wolff-Parkinson-White syndrome) 08/30/2011    Past Surgical History:  Procedure Laterality Date  . BACK SURGERY    . CHEST SURGERY    . RHINOPLASTY    . TONSILLECTOMY         Home Medications    Prior to Admission medications   Medication Sig Start Date End Date Taking? Authorizing Provider  albuterol (PROAIR HFA) 108 (90 Base) MCG/ACT inhaler Inhale 2 puffs into the lungs every 6 (six) hours as needed for shortness of breath. 05/21/16   Armandina Stammer I, NP  ARIPiprazole (ABILIFY) 15 MG tablet Take 0.5 tablets (7.5 mg total) by mouth 2 (two) times daily. For mood control 05/21/16   Armandina Stammer I, NP  citalopram (CELEXA) 20 MG tablet Take 1 tablet (20 mg total) by mouth daily. For depression 05/21/16   Armandina Stammer I, NP  gabapentin (NEURONTIN) 400 MG capsule Take 3  capsules (1,200 mg total) by mouth 3 (three) times daily. For agitation 05/21/16   Armandina StammerNwoko, Agnes I, NP  hydrOXYzine (ATARAX/VISTARIL) 50 MG tablet Take 1 tablet (50 mg total) by mouth at bedtime and may repeat dose one time if needed. For anxiety/insomnia 05/21/16   Armandina StammerNwoko, Agnes I, NP  insulin glargine (LANTUS) 100 UNIT/ML injection Inject 0.6 mLs (60 Units total) into the skin at bedtime. For diabetes managment 05/21/16   Armandina StammerNwoko, Agnes I, NP  INVOKANA 300 MG TABS tablet Take 1 tablet (300 mg total) by mouth daily. For diabetes management 05/21/16   Armandina StammerNwoko, Agnes I, NP  lisinopril (PRINIVIL,ZESTRIL) 20 MG tablet Take 1 tablet (20 mg total) by mouth daily. For high blood pressure 05/21/16   Nwoko, Nicole KindredAgnes I, NP  nicotine (NICODERM CQ - DOSED IN  MG/24 HOURS) 21 mg/24hr patch Place 1 patch (21 mg total) onto the skin daily at 6 (six) AM. For smoking cessation 05/22/16   Armandina StammerNwoko, Agnes I, NP  omeprazole (PRILOSEC) 20 MG capsule Take 2 capsules (40 mg total) by mouth daily. For acid reflux 05/21/16   Armandina StammerNwoko, Agnes I, NP  oxymetazoline (AFRIN) 0.05 % nasal spray Place 1 spray into both nostrils 2 (two) times daily as needed for congestion. 05/21/16   Armandina StammerNwoko, Agnes I, NP  tiotropium (SPIRIVA) 18 MCG inhalation capsule Place 1 capsule (18 mcg total) into inhaler and inhale daily. For COPD 05/21/16   Armandina StammerNwoko, Agnes I, NP  tiZANidine (ZANAFLEX) 2 MG tablet Take 2 tablets (4 mg total) by mouth 3 (three) times daily. For pain 05/21/16   Armandina StammerNwoko, Agnes I, NP  traZODone (DESYREL) 100 MG tablet Take 2 tablets (200 mg total) by mouth at bedtime. For sleep 05/22/16   Armandina StammerNwoko, Agnes I, NP  XARELTO 20 MG TABS tablet Take 1 tablet (20 mg total) by mouth daily. For prevention of blood clot 05/21/16   Armandina StammerNwoko, Agnes I, NP    Family History Family History  Problem Relation Age of Onset  . Mental illness Neg Hx     Social History Social History  Substance Use Topics  . Smoking status: Current Every Day Smoker    Packs/day: 1.00    Years: 8.00    Types: Cigarettes  . Smokeless tobacco: Never Used  . Alcohol use Yes     Comment: last drink 24 days     Allergies   Patient has no known allergies.   Review of Systems Review of Systems  Musculoskeletal:       Right foot pain/swelling  Psychiatric/Behavioral: Positive for dysphoric mood and suicidal ideas.  All other systems reviewed and are negative.    Physical Exam Updated Vital Signs BP (!) 142/97   Pulse (!) 44   Temp 97.7 F (36.5 C) (Oral)   Resp 16   Ht 6' (1.829 m)   Wt 88.5 kg (195 lb)   SpO2 100%   BMI 26.45 kg/m   Physical Exam  Constitutional: He is oriented to person, place, and time. He appears well-developed and well-nourished.  HENT:  Head: Normocephalic and atraumatic.    Right Ear: External ear normal.  Left Ear: External ear normal.  Nose: Nose normal.  Mouth/Throat: Oropharynx is clear and moist.  Eyes: Conjunctivae and EOM are normal. Pupils are equal, round, and reactive to light.  Neck: Normal range of motion. Neck supple.  Cardiovascular: Regular rhythm, normal heart sounds and intact distal pulses.  Bradycardia present.   Pulmonary/Chest: Effort normal and breath sounds normal.  Abdominal: Soft. Bowel  sounds are normal.  Musculoskeletal:       Feet:  Neurological: He is alert and oriented to person, place, and time.  Skin: Skin is warm and dry.  Psychiatric: He expresses impulsivity. He exhibits a depressed mood. He expresses suicidal ideation. He expresses suicidal plans.  Nursing note and vitals reviewed.    ED Treatments / Results  Labs (all labs ordered are listed, but only abnormal results are displayed) Labs Reviewed  CBC WITH DIFFERENTIAL/PLATELET - Abnormal; Notable for the following:       Result Value   Platelets 141 (*)    All other components within normal limits  COMPREHENSIVE METABOLIC PANEL - Abnormal; Notable for the following:    Sodium 134 (*)    Glucose, Bld 195 (*)    ALT 10 (*)    Total Bilirubin 1.4 (*)    All other components within normal limits  URINALYSIS, ROUTINE W REFLEX MICROSCOPIC - Abnormal; Notable for the following:    Glucose, UA >=500 (*)    All other components within normal limits  ACETAMINOPHEN LEVEL - Abnormal; Notable for the following:    Acetaminophen (Tylenol), Serum <10 (*)    All other components within normal limits  CBG MONITORING, ED - Abnormal; Notable for the following:    Glucose-Capillary 211 (*)    All other components within normal limits  TROPONIN I  SALICYLATE LEVEL  RAPID URINE DRUG SCREEN, HOSP PERFORMED    EKG  EKG Interpretation  Date/Time:  Monday November 24 2016 12:39:34 EDT Ventricular Rate:  43 PR Interval:    QRS Duration: 98 QT Interval:  481 QTC  Calculation: 407 R Axis:   60 Text Interpretation:  Sinus bradycardia Abnormal R-wave progression, early transition Confirmed by Jacalyn Lefevre [phone]) on 11/24/2016 1:26:05 PM       Radiology Dg Chest 2 View  Result Date: 11/24/2016 CLINICAL DATA:  Status post fall.  Syncope. EXAM: CHEST  2 VIEW COMPARISON:  09/15/2015 FINDINGS: The heart size and mediastinal contours are within normal limits. Both lungs are clear. The visualized skeletal structures are unremarkable. IMPRESSION: No active cardiopulmonary disease. Electronically Signed   By: Elige Ko   On: 11/24/2016 14:19   Dg Foot Complete Right  Result Date: 11/24/2016 CLINICAL DATA:  Initial evaluation for acute trauma, fall. EXAM: RIGHT FOOT COMPLETE - 3+ VIEW COMPARISON:  None. FINDINGS: Acute transverse comminuted fracture extends through the base of the right second metatarsal without significant displacement. LisFranc joint grossly approximated. Additional acute comminuted fracture extends through the base of the right fourth metatarsal, and likely the third metatarsal as well. No other acute fracture or dislocation. Diffuse soft tissue swelling present about the foot. IMPRESSION: Acute comminuted fractures through the bases of the right second and fourth metatarsals, and likely the right third metatarsal as well. While the Lisfranc joint is grossly approximated, possible Lisfranc injury should be considered. Electronically Signed   By: Rise Mu M.D.   On: 11/24/2016 14:24    Procedures Procedures (including critical care time)  Medications Ordered in ED Medications  sodium chloride 0.9 % bolus 1,000 mL (1,000 mLs Intravenous New Bag/Given 11/24/16 1343)    And  0.9 %  sodium chloride infusion ( Intravenous New Bag/Given 11/24/16 1346)  acetaminophen (TYLENOL) tablet 650 mg (not administered)  alum & mag hydroxide-simeth (MAALOX/MYLANTA) 200-200-20 MG/5ML suspension 30 mL (not administered)  ibuprofen (ADVIL,MOTRIN)  tablet 600 mg (not administered)  nicotine (NICODERM CQ - dosed in mg/24 hours) patch 21 mg (not administered)  ondansetron (ZOFRAN) tablet 4 mg (not administered)  zolpidem (AMBIEN) tablet 5 mg (not administered)  albuterol (PROVENTIL HFA;VENTOLIN HFA) 108 (90 Base) MCG/ACT inhaler 2 puff (not administered)  ARIPiprazole (ABILIFY) tablet 7.5 mg (not administered)  citalopram (CELEXA) tablet 20 mg (not administered)  gabapentin (NEURONTIN) capsule 1,200 mg (not administered)  hydrOXYzine (ATARAX/VISTARIL) tablet 50 mg (not administered)  insulin glargine (LANTUS) injection 60 Units (not administered)  canagliflozin (INVOKANA) tablet 300 mg (not administered)  pantoprazole (PROTONIX) EC tablet 40 mg (not administered)  tiotropium (SPIRIVA) inhalation capsule 18 mcg (18 mcg Inhalation Not Given 11/24/16 1519)  traZODone (DESYREL) tablet 200 mg (not administered)     Initial Impression / Assessment and Plan / ED Course  I have reviewed the triage vital signs and the nursing notes.  Pertinent labs & imaging results that were available during my care of the patient were reviewed by me and considered in my medical decision making (see chart for details).   BP is better after IVFs.  He is no longer orthostatic.  I suspect this is why he's passed out  I question if he took an overdose of bp meds.  However, bp is much improved, so I don't think he needs a medical admission.  I will hold bp meds while here.  Pt placed in a posterior leg splint and crutches.  He will be made NWB on right leg.  Pt is clearly suicidal with a plan.  TTS will be consulted.  Psych hold orders placed.  Home meds (except lisinopril) have been ordered.  Pt meets criteria for inpatient psych admission.  Final Clinical Impressions(s) / ED Diagnoses   Final diagnoses:  Current severe episode of major depressive disorder without psychotic features without prior episode (HCC)  Suicidal ideation  Closed nondisplaced fracture  of second metatarsal bone of right foot, initial encounter  Closed nondisplaced fracture of third metatarsal bone of right foot, initial encounter  Closed nondisplaced fracture of fourth metatarsal bone of right foot, initial encounter  Hypotension, unspecified hypotension type    New Prescriptions New Prescriptions   No medications on file     Jacalyn Lefevre, MD 11/24/16 1527

## 2016-11-24 NOTE — ED Triage Notes (Signed)
EDP [place] PRESENT AT BEDSIDE EVALUATING PT. Pt brought in by EMS. Pt states has had several syncopal episodes over last week. Reason unknown. Attempt to hang self last night however fell off chair. C/o of rt foot pain. Pt presents with swelling and tenderness to rt foot. Pt admits to SI. Denies HI. Denies ETOH. Admits to cocaine last week and denies any other use. Denies ingestion or overdose.Denies any recent illness. Fever N/V/D.

## 2016-11-25 ENCOUNTER — Emergency Department (HOSPITAL_COMMUNITY): Payer: Medicare Other

## 2016-11-25 DIAGNOSIS — F17210 Nicotine dependence, cigarettes, uncomplicated: Secondary | ICD-10-CM | POA: Diagnosis not present

## 2016-11-25 DIAGNOSIS — T1491XA Suicide attempt, initial encounter: Secondary | ICD-10-CM | POA: Diagnosis not present

## 2016-11-25 DIAGNOSIS — X838XXA Intentional self-harm by other specified means, initial encounter: Secondary | ICD-10-CM | POA: Diagnosis not present

## 2016-11-25 DIAGNOSIS — F1290 Cannabis use, unspecified, uncomplicated: Secondary | ICD-10-CM

## 2016-11-25 DIAGNOSIS — F1490 Cocaine use, unspecified, uncomplicated: Secondary | ICD-10-CM | POA: Diagnosis not present

## 2016-11-25 DIAGNOSIS — R45851 Suicidal ideations: Secondary | ICD-10-CM

## 2016-11-25 LAB — CBG MONITORING, ED
GLUCOSE-CAPILLARY: 148 mg/dL — AB (ref 65–99)
Glucose-Capillary: 182 mg/dL — ABNORMAL HIGH (ref 65–99)

## 2016-11-25 NOTE — ED Notes (Signed)
Patients right leg elevated on blankets as per ortho physician.

## 2016-11-25 NOTE — Consult Note (Signed)
[place] [facility] Consult   Reason for Consult:  Suicidal ideation with attempt Referring Physician:  EDP Patient Identification: Billy [name] MRN:  [id number] Principal Diagnosis: Attempted suicide [facility])  Diagnosis:   Patient Active Problem List   Diagnosis Date Noted  . Suicidal ideation [R45.851]   . Bipolar disorder, curr episode mixed, severe, w/o psychotic features ([place]) [F31.63] 05/17/2016  . Cocaine use disorder, severe, dependence ([place]) [F14.20] 05/17/2016  . Affective psychosis, bipolar ([place]) [F31.9]   . Bipolar affective disorder, depressed ([place]) [F31.30] 08/09/2015  . Bipolar disorder with current episode depressed ([place]) [F31.30] 08/09/2015  . Polysubstance abuse [F19.10]   . Type 2 diabetes mellitus with hyperglycemia ([place]) [E11.65] 09/19/2014  . Right leg DVT ([place]) [I82.401] 09/19/2014  . Essential hypertension [I10] 09/19/2014  . Bipolar affective disorder, depressed, severe, with psychotic behavior ([place]) [F31.5] 09/14/2014  . Chronic pain syndrome [G89.4] 09/14/2014  . Cocaine abuse [F14.10] 09/13/2014  . Alcohol use disorder, mild, abuse [F10.10] 10/12/2012  . Homeless [Z59.0] 09/02/2011  . Opiate abuse, episodic [F11.10] 09/02/2011  . Diabetes mellitus [250] 08/30/2011  . WPW (Wolff-Parkinson-White syndrome) [I45.6] 08/30/2011    Total Time spent with patient: 30 minutes  Subjective:   Billy [name] is a 56 y.o. male patient admitted with suicide attempt.  HPI:  Billy [name] is a 56 year old male who presented to the [facility] after attempting to hang himself. Pt stated he braided some plastic bags together but when he got up on the stool he passed out, fell, and was not able to get the ligature around his neck. Pt stated he is depressed. Pt stated that when he fell he broke some bones in his foot. Pt is currently wearing a soft cast. Pt lives alone and has no family in this area. Pt stated he has two children who live in [place]. Pt stated he goes to  [facility] for therapy and medication management but has not been in two months because he can't afford the co-pay. Pt has been off his medications for two months. Pt would benefit from inpatient psychiatric admission once medically cleared. Ortho will do more imaging to see if pt can wear a different type of foot support as he can not go to a psych facility with crutches or a long ace bandage wrap.  Pt's BAL and UDS negative.   Past Psychiatric History: Cocaine use disorder, Bipolar affective disorder, chronic pain syndrome, Affective psychosis, bipolar  Risk to Self: Suicidal Ideation: Yes-Currently Present Suicidal Intent: Yes-Currently Present Is patient at risk for suicide?: Yes Suicidal Plan?: Yes-Currently Present Specify Current Suicidal Plan: tried to hang himself Access to Means: Yes Specify Access to Suicidal Means: access to rope What has been your use of drugs/alcohol within the last 12 months?: used THC and cocaine  2 weeks ago How many times?: 5 Other Self Harm Risks: NA Triggers for Past Attempts: Unknown Intentional Self Injurious Behavior: None Risk to Others: Homicidal Ideation: No Thoughts of Harm to Others: No Current Homicidal Intent: No Current Homicidal Plan: No Access to Homicidal Means: No Identified Victim: none History of harm to others?: No Assessment of Violence: None Noted Violent Behavior Description: no Does patient have access to weapons?: No Criminal Charges Pending?: No Does patient have a court date: No Prior Inpatient Therapy: Prior Inpatient Therapy: No Prior Therapy Dates: multi Prior Therapy Facilty/Provider(s): [facility] Prior Outpatient Therapy: Prior Outpatient Therapy: No Prior Therapy Dates: multi Prior Therapy Facilty/Provider(s): [facility] Reason for Treatment: Depression  Does patient have an ACCT team?: No Does patient have Intensive In-House Services?  : No Does patient have [facility] services? : No Does  patient have P4CC services?: No  Past Medical History:  Past Medical History:  Diagnosis Date  . Bipolar 1 disorder ([place])   . Chronic pain   . Depression   . Diabetes mellitus   . Diabetes mellitus without complication ([place])   . HCV (hepatitis C virus)   . Hepatitis C carrier ([place])   . Homeless   . Hypertension   . Suicide attempt ([place])   . WPW (Wolff-Parkinson-White syndrome)     Past Surgical History:  Procedure Laterality Date  . BACK SURGERY    . CHEST SURGERY    . RHINOPLASTY    . TONSILLECTOMY     Family History:  Family History  Problem Relation Age of Onset  . Mental illness Neg Hx    Family Psychiatric  History: Unknown Social History:  History  Alcohol Use  . Yes    Comment: last drink 24 days     History  Drug Use  . Types: Marijuana, "Crack" cocaine, Cocaine    Comment: last used 09/03/2014    Social History   Social History  . Marital status: Divorced    Spouse name: N/A  . Number of children: N/A  . Years of education: N/A   Social History Main Topics  . Smoking status: Current Every Day Smoker    Packs/day: 1.00    Years: 8.00    Types: Cigarettes  . Smokeless tobacco: Never Used  . Alcohol use Yes     Comment: last drink 24 days  . Drug use: Yes    Types: Marijuana, "Crack" cocaine, Cocaine     Comment: last used 09/03/2014  . Sexual activity: Not Asked   Other Topics Concern  . None   Social History Narrative   ** Merged History Encounter **       Additional Social History:    Allergies:  No Known Allergies  Labs:  Results for orders placed or performed during the hospital encounter of 11/24/16 (from the past 48 hour(s))  CBC WITH DIFFERENTIAL     Status: Abnormal   Collection Time: 11/24/16  1:18 PM  Result Value Ref Range   WBC 9.4 4.0 - 10.5 K/uL   RBC 5.03 4.22 - 5.81 MIL/uL   Hemoglobin 15.2 13.0 - 17.0 g/dL   HCT 42.5 39.0 - 52.0 %   MCV 84.5 78.0 - 100.0 fL   MCH 30.2 26.0 - 34.0 pg   MCHC 35.8 30.0 - 36.0 g/dL    RDW 12.6 11.5 - 15.5 %   Platelets 141 (L) 150 - 400 K/uL   Neutrophils Relative % 67 %   Neutro Abs 6.2 1.7 - 7.7 K/uL   Lymphocytes Relative 25 %   Lymphs Abs 2.4 0.7 - 4.0 K/uL   Monocytes Relative 7 %   Monocytes Absolute 0.7 0.1 - 1.0 K/uL   Eosinophils Relative 1 %   Eosinophils Absolute 0.1 0.0 - 0.7 K/uL   Basophils Relative 0 %   Basophils Absolute 0.0 0.0 - 0.1 K/uL  Comprehensive metabolic panel     Status: Abnormal   Collection Time: 11/24/16  1:18 PM  Result Value Ref Range   Sodium 134 (L) 135 - 145 mmol/L   Potassium 4.1 3.5 - 5.1 mmol/L   Chloride 103 101 - 111 mmol/L   CO2 22 22 - 32 mmol/L   Glucose,  Bld 195 (H) 65 - 99 mg/dL   BUN 11 6 - 20 mg/dL   Creatinine, Ser 1.08 0.61 - 1.24 mg/dL   Calcium 9.2 8.9 - 10.3 mg/dL   Total Protein 7.1 6.5 - 8.1 g/dL   Albumin 4.1 3.5 - 5.0 g/dL   AST 16 15 - 41 U/L   ALT 10 (L) 17 - 63 U/L   Alkaline Phosphatase 61 38 - 126 U/L   Total Bilirubin 1.4 (H) 0.3 - 1.2 mg/dL   GFR calc non Af Amer >60 >60 mL/min   GFR calc Af Amer >60 >60 mL/min    Comment: (NOTE) The eGFR has been calculated using the CKD EPI equation. This calculation has not been validated in all clinical situations. eGFR's persistently <60 mL/min signify possible Chronic Kidney Disease.    Anion gap 9 5 - 15  Troponin I     Status: None   Collection Time: 11/24/16  1:18 PM  Result Value Ref Range   Troponin I <0.03 <0.03 ng/mL  Urinalysis, Routine w reflex microscopic     Status: Abnormal   Collection Time: 11/24/16  1:19 PM  Result Value Ref Range   Color, Urine YELLOW YELLOW   APPearance CLEAR CLEAR   Specific Gravity, Urine 1.005 1.005 - 1.030   pH 6.0 5.0 - 8.0   Glucose, UA >=500 (A) NEGATIVE mg/dL   Hgb urine dipstick NEGATIVE NEGATIVE   Bilirubin Urine NEGATIVE NEGATIVE   Ketones, ur NEGATIVE NEGATIVE mg/dL   Protein, ur NEGATIVE NEGATIVE mg/dL   Nitrite NEGATIVE NEGATIVE   Leukocytes, UA NEGATIVE NEGATIVE   RBC / HPF NONE SEEN 0 -  5 RBC/hpf   WBC, UA 0-5 0 - 5 WBC/hpf   Bacteria, UA NONE SEEN NONE SEEN   Squamous Epithelial / LPF NONE SEEN NONE SEEN  Salicylate level     Status: None   Collection Time: 11/24/16  1:19 PM  Result Value Ref Range   Salicylate Lvl <9.4 2.8 - 30.0 mg/dL  Acetaminophen level     Status: Abnormal   Collection Time: 11/24/16  1:19 PM  Result Value Ref Range   Acetaminophen (Tylenol), Serum <10 (L) 10 - 30 ug/mL    Comment:        THERAPEUTIC CONCENTRATIONS VARY SIGNIFICANTLY. A RANGE OF 10-30 ug/mL MAY BE AN EFFECTIVE CONCENTRATION FOR MANY PATIENTS. HOWEVER, SOME ARE BEST TREATED AT CONCENTRATIONS OUTSIDE THIS RANGE. ACETAMINOPHEN CONCENTRATIONS >150 ug/mL AT 4 HOURS AFTER INGESTION AND >50 ug/mL AT 12 HOURS AFTER INGESTION ARE OFTEN ASSOCIATED WITH TOXIC REACTIONS.   Rapid urine drug screen (hospital performed)     Status: None   Collection Time: 11/24/16  1:19 PM  Result Value Ref Range   Opiates NONE DETECTED NONE DETECTED   Cocaine NONE DETECTED NONE DETECTED   Benzodiazepines NONE DETECTED NONE DETECTED   Amphetamines NONE DETECTED NONE DETECTED   Tetrahydrocannabinol NONE DETECTED NONE DETECTED   Barbiturates NONE DETECTED NONE DETECTED    Comment:        DRUG SCREEN FOR MEDICAL PURPOSES ONLY.  IF CONFIRMATION IS NEEDED FOR ANY PURPOSE, NOTIFY LAB WITHIN 5 DAYS.        LOWEST DETECTABLE LIMITS FOR URINE DRUG SCREEN Drug Class       Cutoff (ng/mL) Amphetamine      1000 Barbiturate      200 Benzodiazepine   503 Tricyclics       888 Opiates          300 Cocaine  300 THC              50   CBG monitoring, ED     Status: Abnormal   Collection Time: 11/24/16  1:34 PM  Result Value Ref Range   Glucose-Capillary 211 (H) 65 - 99 mg/dL  POC CBG, ED     Status: Abnormal   Collection Time: 11/24/16  5:55 PM  Result Value Ref Range   Glucose-Capillary 169 (H) 65 - 99 mg/dL  POC CBG, ED     Status: Abnormal   Collection Time: 11/25/16  8:04 AM  Result  Value Ref Range   Glucose-Capillary 148 (H) 65 - 99 mg/dL    Current Facility-Administered Medications  Medication Dose Route Frequency Provider Last Rate Last Dose  . 0.9 %  sodium chloride infusion   Intravenous Continuous Isla Pence, MD 125 mL/hr at 11/24/16 1346    . acetaminophen (TYLENOL) tablet 650 mg  650 mg Oral Q4H PRN Isla Pence, MD      . albuterol (PROVENTIL HFA;VENTOLIN HFA) 108 (90 Base) MCG/ACT inhaler 2 puff  2 puff Inhalation Q6H PRN Isla Pence, MD      . alum & mag hydroxide-simeth (MAALOX/MYLANTA) 200-200-20 MG/5ML suspension 30 mL  30 mL Oral Q6H PRN Isla Pence, MD      . ARIPiprazole (ABILIFY) tablet 7.5 mg  7.5 mg Oral BID Isla Pence, MD   7.5 mg at 11/25/16 1303  . canagliflozin (INVOKANA) tablet 300 mg  300 mg Oral Daily Isla Pence, MD   300 mg at 11/25/16 1302  . citalopram (CELEXA) tablet 20 mg  20 mg Oral Daily Isla Pence, MD   20 mg at 11/25/16 1301  . cloNIDine (CATAPRES) tablet 0.2 mg  0.2 mg Oral BID Tanna Furry, MD   0.2 mg at 11/25/16 1300  . fluticasone (FLONASE) 50 MCG/ACT nasal spray 1 spray  1 spray Each Nare Daily Tanna Furry, MD   1 spray at 11/25/16 1536  . gabapentin (NEURONTIN) capsule 1,200 mg  1,200 mg Oral TID Isla Pence, MD   1,200 mg at 11/25/16 1301  . hydrOXYzine (ATARAX/VISTARIL) tablet 50 mg  50 mg Oral QHS,MR X 1 Isla Pence, MD   50 mg at 11/24/16 2228  . ibuprofen (ADVIL,MOTRIN) tablet 600 mg  600 mg Oral Q8H PRN Isla Pence, MD   600 mg at 11/24/16 1654  . insulin glargine (LANTUS) injection 60 Units  60 Units Subcutaneous QHS Isla Pence, MD   60 Units at 11/24/16 2231  . nicotine (NICODERM CQ - dosed in mg/24 hours) patch 21 mg  21 mg Transdermal Daily Isla Pence, MD   21 mg at 11/25/16 1300  . ondansetron (ZOFRAN) tablet 4 mg  4 mg Oral Q8H PRN Isla Pence, MD      . pantoprazole (PROTONIX) EC tablet 40 mg  40 mg Oral Daily Isla Pence, MD   40 mg at 11/25/16 1302  .  rivaroxaban (XARELTO) tablet 20 mg  20 mg Oral Q supper Davonna Belling, MD   20 mg at 11/24/16 2227  . tiotropium (SPIRIVA) inhalation capsule 18 mcg  18 mcg Inhalation Daily Isla Pence, MD   18 mcg at 11/24/16 2231  . traZODone (DESYREL) tablet 200 mg  200 mg Oral QHS Isla Pence, MD   200 mg at 11/24/16 2228  . zolpidem (AMBIEN) tablet 5 mg  5 mg Oral QHS PRN Isla Pence, MD       Current Outpatient Prescriptions  Medication Sig Dispense Refill  .  albuterol (PROAIR HFA) 108 (90 Base) MCG/ACT inhaler Inhale 2 puffs into the lungs every 6 (six) hours as needed for shortness of breath. 1 Inhaler 0  . hydrOXYzine (ATARAX/VISTARIL) 50 MG tablet Take 1 tablet (50 mg total) by mouth at bedtime and may repeat dose one time if needed. For anxiety/insomnia 60 tablet 0  . insulin glargine (LANTUS) 100 UNIT/ML injection Inject 0.6 mLs (60 Units total) into the skin at bedtime. For diabetes managment 10 mL 0  . omeprazole (PRILOSEC) 20 MG capsule Take 2 capsules (40 mg total) by mouth daily. For acid reflux    . tiotropium (SPIRIVA) 18 MCG inhalation capsule Place 1 capsule (18 mcg total) into inhaler and inhale daily. For COPD 30 capsule 12  . tiZANidine (ZANAFLEX) 2 MG tablet Take 2 tablets (4 mg total) by mouth 3 (three) times daily. For pain 1 tablet 0  . traZODone (DESYREL) 100 MG tablet Take 2 tablets (200 mg total) by mouth at bedtime. For sleep 60 tablet 0  . ARIPiprazole (ABILIFY) 15 MG tablet Take 0.5 tablets (7.5 mg total) by mouth 2 (two) times daily. For mood control (Patient not taking: Reported on 11/24/2016) 60 tablet 0  . citalopram (CELEXA) 20 MG tablet Take 1 tablet (20 mg total) by mouth daily. For depression (Patient not taking: Reported on 11/24/2016) 30 tablet 0  . gabapentin (NEURONTIN) 400 MG capsule Take 3 capsules (1,200 mg total) by mouth 3 (three) times daily. For agitation (Patient not taking: Reported on 11/24/2016) 270 capsule 0  . INVOKANA 300 MG TABS tablet Take 1  tablet (300 mg total) by mouth daily. For diabetes management (Patient not taking: Reported on 11/24/2016) 1 tablet 0  . lisinopril (PRINIVIL,ZESTRIL) 20 MG tablet Take 1 tablet (20 mg total) by mouth daily. For high blood pressure (Patient not taking: Reported on 11/24/2016) 1 tablet 0  . nicotine (NICODERM CQ - DOSED IN MG/24 HOURS) 21 mg/24hr patch Place 1 patch (21 mg total) onto the skin daily at 6 (six) AM. For smoking cessation (Patient not taking: Reported on 11/24/2016) 28 patch 0  . oxymetazoline (AFRIN) 0.05 % nasal spray Place 1 spray into both nostrils 2 (two) times daily as needed for congestion. (Patient not taking: Reported on 11/24/2016) 30 mL 0  . XARELTO 20 MG TABS tablet Take 1 tablet (20 mg total) by mouth daily. For prevention of blood clot (Patient not taking: Reported on 11/24/2016) 1 tablet 0    Musculoskeletal: Strength & Muscle Tone: within normal limits Gait & Station: normal Patient leans: N/A  Psychiatric Specialty Exam: Physical Exam  Constitutional: He appears well-developed and well-nourished.  HENT:  Head: Normocephalic.  Neck: Normal range of motion.  Respiratory: Effort normal.  Musculoskeletal: Normal range of motion.  Neurological: He is alert.  Psychiatric: His speech is normal and behavior is normal. Cognition and memory are normal. He expresses impulsivity. He exhibits a depressed mood. He expresses suicidal ideation.    Review of Systems  Psychiatric/Behavioral: Positive for depression and suicidal ideas. Negative for hallucinations, memory loss and substance abuse. The patient is not nervous/anxious and does not have insomnia.     Blood pressure (!) 142/100, pulse (!) 56, temperature 97.9 F (36.6 C), temperature source Oral, resp. rate 16, height 6' (1.829 m), weight 88.5 kg (195 lb), SpO2 99 %.Body mass index is 26.45 kg/m.  General Appearance: Disheveled  Eye Contact:  Good  Speech:  Clear and Coherent and Normal Rate  Volume:  Normal  Mood:  Depressed, Hopeless and Worthless  Affect:  Congruent, Depressed and Flat  Thought Process:  Coherent and Linear  Orientation:  Full (Time, Place, and Person)  Thought Content:  Logical  Suicidal Thoughts:  Yes.  with intent/plan  Homicidal Thoughts:  No  Memory:  Immediate;   Good Recent;   Fair Remote;   Fair  Judgement:  Poor  Insight:  Lacking  Psychomotor Activity:  Normal  Concentration:  Concentration: Good and Attention Span: Good  Recall:  Good  Fund of Knowledge:  Good  Language:  Good  Akathisia:  No  Handed:  Right  AIMS (if indicated):     Assets:  Communication Skills Desire for Improvement Financial Resources/Insurance Housing Social Support  ADL's:  Intact  Cognition:  WNL  Sleep:        Treatment Plan Summary: Daily contact with patient to assess and evaluate symptoms and progress in treatment, Medication management and Plan Attempted Suicide  Crisis Stabilization Continue currently prescribed medications (See MAR)   Disposition: Recommend psychiatric Inpatient admission when medically cleared.   Ethelene Hal, NP 11/25/2016 4:02 PM  Patient seen face-to-face for psychiatric evaluation, chart reviewed and case discussed with the physician extender and developed treatment plan. Reviewed the information documented and agree with the treatment plan. Corena Pilgrim, MD

## 2016-11-25 NOTE — ED Provider Notes (Signed)
2:10 PM  BH team called report that patient has a bed at behavioral health for further management of his suicidal attempt. They report that since the patient is in a posterior leg splint with Ace wrap surrounding, he is not safe for their admission as he tried to hang himself yesterday.  Review of patient's chart showed the patient has multiple right foot fractures with possible Lisfranc injury. Due to concern about severity of injury, orthopedist will be called to discuss if a walking boot is appropriate and safe if the patient is confined to wheelchair or if patient needs different management.  2:43 PM Orthopedics was contacted and they recommend CT scan of the right foot. If there is any evidence of Lisfranc injury, they recommend MRI of the right foot. They felt that if there is no Lisfranc injury and it is just a metatarsal injury, patient will be appropriate for a walking boot but remain nonweightbearing and use a wheelchair. Patient [facility] then [facility]. Anticipate reassessment and speaking with orthopedics after CT imaging.  Care transferred to Dr. Ranae PalmsYelverton while awaiting CT results.     Tegeler, Canary Brimhristopher J, MD 11/25/16 [phone]

## 2016-11-25 NOTE — Consult Note (Signed)
Reason for Consult:Right foot fxs Referring Physician: C Tegeler  Billy [name] is an 56 y.o. male.  HPI: Billy [name] was having syncopal issues at home and struggling with suicidal thoughts. He attempted to fashion a noose and hang himself but when he climbed up on the chair (before he put the noose on) he had another syncopal event and fell. When he came to he had significant foot pain and swelling. He was brought in to the ED for evaluation and found to have 2-4 MT base fxs on the right. Orthopedic surgery was consulted.  Past Medical History:  Diagnosis Date  . Bipolar 1 disorder ([place])   . Chronic pain   . Depression   . Diabetes mellitus   . Diabetes mellitus without complication ([place])   . HCV (hepatitis C virus)   . Hepatitis C carrier ([place])   . Homeless   . Hypertension   . Suicide attempt ([place])   . WPW (Wolff-Parkinson-White syndrome)     Past Surgical History:  Procedure Laterality Date  . BACK SURGERY    . CHEST SURGERY    . RHINOPLASTY    . TONSILLECTOMY      Family History  Problem Relation Age of Onset  . Mental illness Neg Hx     Social History:  reports that he has been smoking Cigarettes.  He has a 8.00 pack-year smoking history. He has never used smokeless tobacco. He reports that he drinks alcohol. He reports that he uses drugs, including Marijuana, "Crack" cocaine, and Cocaine.  Allergies: No Known Allergies  Medications: I have reviewed the patient's current medications.  Results for orders placed or performed during the hospital encounter of 11/24/16 (from the past 48 hour(s))  CBC WITH DIFFERENTIAL     Status: Abnormal   Collection Time: 11/24/16  1:18 PM  Result Value Ref Range   WBC 9.4 4.0 - 10.5 K/uL   RBC 5.03 4.22 - 5.81 MIL/uL   Hemoglobin 15.2 13.0 - 17.0 g/dL   HCT 42.5 39.0 - 52.0 %   MCV 84.5 78.0 - 100.0 fL   MCH 30.2 26.0 - 34.0 pg   MCHC 35.8 30.0 - 36.0 g/dL   RDW 12.6 11.5 - 15.5 %   Platelets 141 (L) 150 - 400 K/uL   Neutrophils  Relative % 67 %   Neutro Abs 6.2 1.7 - 7.7 K/uL   Lymphocytes Relative 25 %   Lymphs Abs 2.4 0.7 - 4.0 K/uL   Monocytes Relative 7 %   Monocytes Absolute 0.7 0.1 - 1.0 K/uL   Eosinophils Relative 1 %   Eosinophils Absolute 0.1 0.0 - 0.7 K/uL   Basophils Relative 0 %   Basophils Absolute 0.0 0.0 - 0.1 K/uL  Comprehensive metabolic panel     Status: Abnormal   Collection Time: 11/24/16  1:18 PM  Result Value Ref Range   Sodium 134 (L) 135 - 145 mmol/L   Potassium 4.1 3.5 - 5.1 mmol/L   Chloride 103 101 - 111 mmol/L   CO2 22 22 - 32 mmol/L   Glucose, Bld 195 (H) 65 - 99 mg/dL   BUN 11 6 - 20 mg/dL   Creatinine, Ser 1.08 0.61 - 1.24 mg/dL   Calcium 9.2 8.9 - 10.3 mg/dL   Total Protein 7.1 6.5 - 8.1 g/dL   Albumin 4.1 3.5 - 5.0 g/dL   AST 16 15 - 41 U/L   ALT 10 (L) 17 - 63 U/L   Alkaline Phosphatase 61 38 - 126  U/L   Total Bilirubin 1.4 (H) 0.3 - 1.2 mg/dL   GFR calc non Af Amer >60 >60 mL/min   GFR calc Af Amer >60 >60 mL/min    Comment: (NOTE) The eGFR has been calculated using the CKD EPI equation. This calculation has not been validated in all clinical situations. eGFR's persistently <60 mL/min signify possible Chronic Kidney Disease.    Anion gap 9 5 - 15  Troponin I     Status: None   Collection Time: 11/24/16  1:18 PM  Result Value Ref Range   Troponin I <0.03 <0.03 ng/mL  Urinalysis, Routine w reflex microscopic     Status: Abnormal   Collection Time: 11/24/16  1:19 PM  Result Value Ref Range   Color, Urine YELLOW YELLOW   APPearance CLEAR CLEAR   Specific Gravity, Urine 1.005 1.005 - 1.030   pH 6.0 5.0 - 8.0   Glucose, UA >=500 (A) NEGATIVE mg/dL   Hgb urine dipstick NEGATIVE NEGATIVE   Bilirubin Urine NEGATIVE NEGATIVE   Ketones, ur NEGATIVE NEGATIVE mg/dL   Protein, ur NEGATIVE NEGATIVE mg/dL   Nitrite NEGATIVE NEGATIVE   Leukocytes, UA NEGATIVE NEGATIVE   RBC / HPF NONE SEEN 0 - 5 RBC/hpf   WBC, UA 0-5 0 - 5 WBC/hpf   Bacteria, UA NONE SEEN NONE SEEN    Squamous Epithelial / LPF NONE SEEN NONE SEEN  Salicylate level     Status: None   Collection Time: 11/24/16  1:19 PM  Result Value Ref Range   Salicylate Lvl <2.8 2.8 - 30.0 mg/dL  Acetaminophen level     Status: Abnormal   Collection Time: 11/24/16  1:19 PM  Result Value Ref Range   Acetaminophen (Tylenol), Serum <10 (L) 10 - 30 ug/mL    Comment:        THERAPEUTIC CONCENTRATIONS VARY SIGNIFICANTLY. A RANGE OF 10-30 ug/mL MAY BE AN EFFECTIVE CONCENTRATION FOR MANY PATIENTS. HOWEVER, SOME ARE BEST TREATED AT CONCENTRATIONS OUTSIDE THIS RANGE. ACETAMINOPHEN CONCENTRATIONS >150 ug/mL AT 4 HOURS AFTER INGESTION AND >50 ug/mL AT 12 HOURS AFTER INGESTION ARE OFTEN ASSOCIATED WITH TOXIC REACTIONS.   Rapid urine drug screen (hospital performed)     Status: None   Collection Time: 11/24/16  1:19 PM  Result Value Ref Range   Opiates NONE DETECTED NONE DETECTED   Cocaine NONE DETECTED NONE DETECTED   Benzodiazepines NONE DETECTED NONE DETECTED   Amphetamines NONE DETECTED NONE DETECTED   Tetrahydrocannabinol NONE DETECTED NONE DETECTED   Barbiturates NONE DETECTED NONE DETECTED    Comment:        DRUG SCREEN FOR MEDICAL PURPOSES ONLY.  IF CONFIRMATION IS NEEDED FOR ANY PURPOSE, NOTIFY LAB WITHIN 5 DAYS.        LOWEST DETECTABLE LIMITS FOR URINE DRUG SCREEN Drug Class       Cutoff (ng/mL) Amphetamine      1000 Barbiturate      200 Benzodiazepine   786 Tricyclics       767 Opiates          300 Cocaine          300 THC              50   CBG monitoring, ED     Status: Abnormal   Collection Time: 11/24/16  1:34 PM  Result Value Ref Range   Glucose-Capillary 211 (H) 65 - 99 mg/dL  POC CBG, ED     Status: Abnormal   Collection Time: 11/24/16  5:55  PM  Result Value Ref Range   Glucose-Capillary 169 (H) 65 - 99 mg/dL  POC CBG, ED     Status: Abnormal   Collection Time: 11/25/16  8:04 AM  Result Value Ref Range   Glucose-Capillary 148 (H) 65 - 99 mg/dL    Dg Chest 2  View  Result Date: 11/24/2016 CLINICAL DATA:  Status post fall.  Syncope. EXAM: CHEST  2 VIEW COMPARISON:  09/15/2015 FINDINGS: The heart size and mediastinal contours are within normal limits. Both lungs are clear. The visualized skeletal structures are unremarkable. IMPRESSION: No active cardiopulmonary disease. Electronically Signed   By: Kathreen Devoid   On: 11/24/2016 14:19   Dg Foot Complete Right  Result Date: 11/24/2016 CLINICAL DATA:  Initial evaluation for acute trauma, fall. EXAM: RIGHT FOOT COMPLETE - 3+ VIEW COMPARISON:  None. FINDINGS: Acute transverse comminuted fracture extends through the base of the right second metatarsal without significant displacement. LisFranc joint grossly approximated. Additional acute comminuted fracture extends through the base of the right fourth metatarsal, and likely the third metatarsal as well. No other acute fracture or dislocation. Diffuse soft tissue swelling present about the foot. IMPRESSION: Acute comminuted fractures through the bases of the right second and fourth metatarsals, and likely the right third metatarsal as well. While the Lisfranc joint is grossly approximated, possible Lisfranc injury should be considered. Electronically Signed   By: Jeannine Boga M.D.   On: 11/24/2016 14:24    Review of Systems  Constitutional: Negative for weight loss.  HENT: Negative for ear discharge, ear pain, hearing loss and tinnitus.   Eyes: Negative for blurred vision, double vision, photophobia and pain.  Respiratory: Negative for cough, sputum production and shortness of breath.   Cardiovascular: Negative for chest pain.  Gastrointestinal: Negative for abdominal pain, nausea and vomiting.  Genitourinary: Negative for dysuria, flank pain, frequency and urgency.  Musculoskeletal: Positive for joint pain (Right foot). Negative for back pain, falls, myalgias and neck pain.  Neurological: Positive for dizziness. Negative for tingling, sensory change,  focal weakness, loss of consciousness and headaches.  Endo/Heme/Allergies: Does not bruise/bleed easily.  Psychiatric/Behavioral: Positive for depression and suicidal ideas. Negative for memory loss and substance abuse. The patient is not nervous/anxious.    Blood pressure (!) 142/100, pulse (!) 56, temperature 97.9 F (36.6 C), temperature source Oral, resp. rate 16, height 6' (1.829 m), weight 88.5 kg (195 lb), SpO2 99 %. Physical Exam  Constitutional: He appears well-developed and well-nourished. No distress.  HENT:  Head: Normocephalic.  Eyes: Conjunctivae are normal. Right eye exhibits no discharge. Left eye exhibits no discharge. No scleral icterus.  Cardiovascular: Normal rate and regular rhythm.   Respiratory: Effort normal. No respiratory distress.  Musculoskeletal:  RLE No traumatic wounds or rash, ecchymoses on toes, foot/ankle splinted  No effusions  Knee stable to varus/ valgus and anterior/posterior stress  Sens DPN, SPN, TN intact  Motor EHL 5/5  Cap refill <3s, No significant edema distally   LLE No traumatic wounds, ecchymosis, or rash  Nontender  No effusions  Knee stable to varus/ valgus and anterior/posterior stress  Sens DPN, SPN, TN intact  Motor EHL, ext, flex, evers 5/5  DP 2+, PT 2+, No significant edema  Neurological: He is alert.  Skin: Skin is warm and dry. He is not diaphoretic.  Psychiatric: He has a normal mood and affect. His behavior is normal.    Assessment/Plan: Fall Right 2-4 MT base fxs -- Awaiting CT scan to r/o LisFranc injury. If compromised  will need MRI of foot w/o contrast. If ok then can be put in CAM walker and be NWB on crutches. Dr. Lyla Glassing to f/u on CT and provide definitive plan. Please elevate whenever possible to help with pain and swelling. Suicidal ideation Syncope    Lisette Abu, PA-C Orthopedic Surgery [phone] 11/25/2016, 3:20 PM

## 2016-11-25 NOTE — ED Notes (Signed)
Pt is alert and verbally responsive. Pt denies pain at this time. Sitter is at bedside.

## 2016-11-26 ENCOUNTER — Inpatient Hospital Stay (HOSPITAL_COMMUNITY)
Admission: AD | Admit: 2016-11-26 | Discharge: 2016-12-04 | DRG: 885 | Disposition: A | Discharge status: Home / Self Care | Payer: Medicare Other | Source: Intra-hospital | Attending: Psychiatry | Admitting: Psychiatry

## 2016-11-26 DIAGNOSIS — I10 Essential (primary) hypertension: Secondary | ICD-10-CM | POA: Diagnosis present

## 2016-11-26 DIAGNOSIS — J449 Chronic obstructive pulmonary disease, unspecified: Secondary | ICD-10-CM | POA: Diagnosis present

## 2016-11-26 DIAGNOSIS — M62830 Muscle spasm of back: Secondary | ICD-10-CM | POA: Diagnosis present

## 2016-11-26 DIAGNOSIS — F332 Major depressive disorder, recurrent severe without psychotic features: Secondary | ICD-10-CM | POA: Diagnosis present

## 2016-11-26 DIAGNOSIS — F411 Generalized anxiety disorder: Secondary | ICD-10-CM | POA: Diagnosis present

## 2016-11-26 DIAGNOSIS — Z86718 Personal history of other venous thrombosis and embolism: Secondary | ICD-10-CM | POA: Diagnosis not present

## 2016-11-26 DIAGNOSIS — E119 Type 2 diabetes mellitus without complications: Secondary | ICD-10-CM | POA: Diagnosis present

## 2016-11-26 DIAGNOSIS — Z9889 Other specified postprocedural states: Secondary | ICD-10-CM | POA: Diagnosis not present

## 2016-11-26 DIAGNOSIS — F17210 Nicotine dependence, cigarettes, uncomplicated: Secondary | ICD-10-CM | POA: Diagnosis present

## 2016-11-26 DIAGNOSIS — Z915 Personal history of self-harm: Secondary | ICD-10-CM | POA: Diagnosis not present

## 2016-11-26 DIAGNOSIS — G894 Chronic pain syndrome: Secondary | ICD-10-CM | POA: Diagnosis present

## 2016-11-26 DIAGNOSIS — F1490 Cocaine use, unspecified, uncomplicated: Secondary | ICD-10-CM | POA: Diagnosis not present

## 2016-11-26 DIAGNOSIS — Z79899 Other long term (current) drug therapy: Secondary | ICD-10-CM | POA: Diagnosis not present

## 2016-11-26 DIAGNOSIS — Z560 Unemployment, unspecified: Secondary | ICD-10-CM | POA: Diagnosis not present

## 2016-11-26 DIAGNOSIS — G4700 Insomnia, unspecified: Secondary | ICD-10-CM | POA: Diagnosis present

## 2016-11-26 DIAGNOSIS — T1491XA Suicide attempt, initial encounter: Secondary | ICD-10-CM | POA: Diagnosis not present

## 2016-11-26 DIAGNOSIS — Z794 Long term (current) use of insulin: Secondary | ICD-10-CM | POA: Diagnosis not present

## 2016-11-26 DIAGNOSIS — F1290 Cannabis use, unspecified, uncomplicated: Secondary | ICD-10-CM | POA: Diagnosis not present

## 2016-11-26 DIAGNOSIS — X789XXA Intentional self-harm by unspecified sharp object, initial encounter: Secondary | ICD-10-CM | POA: Diagnosis not present

## 2016-11-26 DIAGNOSIS — X838XXA Intentional self-harm by other specified means, initial encounter: Secondary | ICD-10-CM | POA: Diagnosis not present

## 2016-11-26 LAB — CBG MONITORING, ED: Glucose-Capillary: 193 mg/dL — ABNORMAL HIGH (ref 65–99)

## 2016-11-26 LAB — GLUCOSE, CAPILLARY: Glucose-Capillary: 151 mg/dL — ABNORMAL HIGH (ref 65–99)

## 2016-11-26 MED ORDER — RIVAROXABAN 20 MG PO TABS
20.0000 mg | ORAL_TABLET | Freq: Every day | ORAL | Status: DC
  Administered 2016-11-26: 20 mg via ORAL
  Filled 2016-11-26 (×3): qty 1

## 2016-11-26 MED ORDER — INSULIN GLARGINE 100 UNIT/ML ~~LOC~~ SOLN
60.0000 [IU] | Freq: Every day | SUBCUTANEOUS | Status: DC
  Administered 2016-11-26 – 2016-12-03 (×8): 60 [IU] via SUBCUTANEOUS

## 2016-11-26 MED ORDER — TRAZODONE HCL 50 MG PO TABS
50.0000 mg | ORAL_TABLET | Freq: Every evening | ORAL | Status: DC | PRN
  Administered 2016-11-26: 50 mg via ORAL
  Filled 2016-11-26: qty 1

## 2016-11-26 MED ORDER — MAGNESIUM HYDROXIDE 400 MG/5ML PO SUSP
30.0000 mL | Freq: Every day | ORAL | Status: DC | PRN
  Administered 2016-12-02: 30 mL via ORAL
  Filled 2016-11-26: qty 30

## 2016-11-26 MED ORDER — ACETAMINOPHEN 325 MG PO TABS
650.0000 mg | ORAL_TABLET | Freq: Four times a day (QID) | ORAL | Status: DC | PRN
  Administered 2016-11-26 – 2016-11-27 (×2): 650 mg via ORAL
  Filled 2016-11-26 (×2): qty 2

## 2016-11-26 MED ORDER — GABAPENTIN 400 MG PO CAPS
1200.0000 mg | ORAL_CAPSULE | Freq: Three times a day (TID) | ORAL | Status: DC
  Administered 2016-11-27 (×3): 1200 mg via ORAL
  Filled 2016-11-26 (×7): qty 3

## 2016-11-26 MED ORDER — CANAGLIFLOZIN 300 MG PO TABS
300.0000 mg | ORAL_TABLET | Freq: Every day | ORAL | Status: DC
  Administered 2016-11-28 – 2016-12-04 (×7): 300 mg via ORAL
  Filled 2016-11-26 (×10): qty 1

## 2016-11-26 MED ORDER — TRAZODONE HCL 100 MG PO TABS
200.0000 mg | ORAL_TABLET | Freq: Every day | ORAL | Status: DC
  Administered 2016-11-26: 200 mg via ORAL
  Filled 2016-11-26 (×4): qty 2

## 2016-11-26 MED ORDER — ALUM & MAG HYDROXIDE-SIMETH 200-200-20 MG/5ML PO SUSP: 30.0000 mL | ORAL | Status: DC | PRN

## 2016-11-26 MED ORDER — FLUTICASONE PROPIONATE 50 MCG/ACT NA SUSP
1.0000 | Freq: Every day | NASAL | Status: DC
  Administered 2016-11-27: 1 via NASAL
  Filled 2016-11-26 (×2): qty 16

## 2016-11-26 MED ORDER — HYDROXYZINE HCL 50 MG PO TABS
50.0000 mg | ORAL_TABLET | Freq: Every evening | ORAL | Status: DC | PRN
  Administered 2016-11-26 – 2016-11-28 (×4): 50 mg via ORAL
  Filled 2016-11-26 (×6): qty 1
  Filled 2016-11-26: qty 2
  Filled 2016-11-26 (×4): qty 1

## 2016-11-26 MED ORDER — HYDROXYZINE HCL 25 MG PO TABS
25.0000 mg | ORAL_TABLET | Freq: Three times a day (TID) | ORAL | Status: DC | PRN
  Administered 2016-11-26: 25 mg via ORAL
  Filled 2016-11-26: qty 1

## 2016-11-26 MED ORDER — INSULIN ASPART 100 UNIT/ML ~~LOC~~ SOLN
0.0000 [IU] | Freq: Three times a day (TID) | SUBCUTANEOUS | Status: DC
  Administered 2016-11-27: 2 [IU] via SUBCUTANEOUS
  Administered 2016-11-27: 3 [IU] via SUBCUTANEOUS
  Administered 2016-11-27: 2 [IU] via SUBCUTANEOUS
  Administered 2016-11-28 (×2): 3 [IU] via SUBCUTANEOUS
  Administered 2016-11-28: 5 [IU] via SUBCUTANEOUS
  Administered 2016-11-29: 3 [IU] via SUBCUTANEOUS
  Administered 2016-11-29 (×2): 2 [IU] via SUBCUTANEOUS
  Administered 2016-11-30: 3 [IU] via SUBCUTANEOUS
  Administered 2016-11-30: 5 [IU] via SUBCUTANEOUS
  Administered 2016-12-01: 2 [IU] via SUBCUTANEOUS
  Administered 2016-12-01: 3 [IU] via SUBCUTANEOUS
  Administered 2016-12-02 (×2): 2 [IU] via SUBCUTANEOUS
  Administered 2016-12-03 – 2016-12-04 (×2): 3 [IU] via SUBCUTANEOUS
  Administered 2016-12-04: 2 [IU] via SUBCUTANEOUS

## 2016-11-26 MED ORDER — ARIPIPRAZOLE 15 MG PO TABS
7.5000 mg | ORAL_TABLET | Freq: Two times a day (BID) | ORAL | Status: DC
  Administered 2016-11-26 – 2016-12-04 (×16): 7.5 mg via ORAL
  Filled 2016-11-26 (×21): qty 1

## 2016-11-26 MED ORDER — CITALOPRAM HYDROBROMIDE 20 MG PO TABS
20.0000 mg | ORAL_TABLET | Freq: Every day | ORAL | Status: DC
  Administered 2016-11-27: 20 mg via ORAL
  Filled 2016-11-26 (×3): qty 1

## 2016-11-26 MED ORDER — TIOTROPIUM BROMIDE MONOHYDRATE 18 MCG IN CAPS
18.0000 ug | ORAL_CAPSULE | Freq: Every day | RESPIRATORY_TRACT | Status: DC
  Administered 2016-11-27 – 2016-12-04 (×8): 18 ug via RESPIRATORY_TRACT
  Filled 2016-11-26 (×3): qty 5

## 2016-11-26 MED ORDER — CLONIDINE HCL 0.2 MG PO TABS
0.2000 mg | ORAL_TABLET | Freq: Two times a day (BID) | ORAL | Status: DC
  Administered 2016-11-26 – 2016-12-04 (×16): 0.2 mg via ORAL
  Filled 2016-11-26 (×16): qty 1
  Filled 2016-11-26: qty 2
  Filled 2016-11-26 (×4): qty 1

## 2016-11-26 MED ORDER — PANTOPRAZOLE SODIUM 40 MG PO TBEC
40.0000 mg | DELAYED_RELEASE_TABLET | Freq: Every day | ORAL | Status: DC
  Administered 2016-11-27 – 2016-12-04 (×8): 40 mg via ORAL
  Filled 2016-11-26 (×10): qty 1

## 2016-11-26 MED ORDER — INSULIN ASPART 100 UNIT/ML ~~LOC~~ SOLN
0.0000 [IU] | Freq: Every day | SUBCUTANEOUS | Status: DC
  Administered 2016-11-29: 2 [IU] via SUBCUTANEOUS

## 2016-11-26 MED ORDER — ALBUTEROL SULFATE HFA 108 (90 BASE) MCG/ACT IN AERS: 2.0000 | INHALATION_SPRAY | Freq: Four times a day (QID) | RESPIRATORY_TRACT | Status: DC | PRN

## 2016-11-26 NOTE — ED Provider Notes (Signed)
CT of foot reviewed. Posterior leg splint was removed by orthopedic technician and replaced with a cam walker. Patient is stable to go to psychiatric facility. Cam walker is comfortable for patient   Doug SouJacubowitz, Doralee Kocak, MD 11/26/16 1243

## 2016-11-26 NOTE — Progress Notes (Signed)
Patient ID: Maudie FlakesMelvin K Kilduff, male   DOB: 05-30-1961, 56 y.o.   MRN: [id number]  Ct scan shows articular fxs but a congruent LisFranc joint. Will place in CAM walker, continue NWB, and allow to f/u as OP with Dr. Carola FrostHandy in 1-2 weeks. [facility].    Freeman CaldronMichael J. Tenaya Hilyer, PA-C Orthopedic Surgery [phone]

## 2016-11-26 NOTE — Progress Notes (Signed)
Nursing Progress Note: 7p-7a D: Pt currently presents with a pleasant/cooperative affect and behavior. Pt states "I really want help. I need it. I try to eat well but I just don't have the resources." Interacting appropriately with milieu. Pt reports good sleep with current medication regimen.   A: Pt provided with medications per providers orders. Pt's labs and vitals were monitored throughout the night. Pt supported emotionally and encouraged to express concerns and questions. Pt educated on medications.  R: Pt's safety ensured with 15 minute and environmental checks. Pt currently denies SI/HI/Self Harm and AVH. Pt verbally contracts to seek staff if SI/HI or A/VH occurs and to consult with staff before acting on any harmful thoughts. Will continue to monitor.

## 2016-11-26 NOTE — BH Assessment (Signed)
[facility] Assessment Progress Note  Per Thedore MinsMojeed Akintayo, MD, this pt requires psychiatric hospitalization at this time.  Malva LimesLinsey Strader, RN, [facility]; they will be ready to receive pt at 14:00.  Pt has signed Voluntary Admission and Consent for Treatment, as well as Consent to Release Information to [facility], and a notification call has been placed.  S[facility].  Pt's nurse has been notified, and agrees to send original paperwork along with pt via [place], and to call report to [phone].  Doylene Canninghomas Ainsleigh Kakos, MA Triage Specialist [phone]

## 2016-11-26 NOTE — BH Assessment (Signed)
Reassessment:   Patient reassessed on this day. He continues to endorse suicidal thoughts with a plan to hang himself. He is not able to contract for safety. Patient reports on-going symptoms of depression such as hopelessness, isolating self from others, crying spells, and loss of interest in usual pleasures. Patient unable to identify an specific stressors. Denies HI and AVH's. Patient is alert and oriented to time, person, place, and situation. He is dressed in scrubs. Mood is depressed and sad. His speech is normal. Eye contact is fair. Per Dr. Jannifer FranklinAkintayo and Carleene OverlieLaura Parks, NP, patient meets criteria for Inpatient treatment. TTS will facilitate appropriate placement. (400 hall).

## 2016-11-26 NOTE — Progress Notes (Signed)
CT imaging reviewed. Dr. Magdalene PatriciaHandy's team to see patient. NWB in CAM boot for now.

## 2016-11-26 NOTE — Consult Note (Signed)
Orthopaedic Trauma Service (OTS) Consult   Reason for Consult: complex fractures R foot  Referring Physician: Geralynn Rile, MD (ortho)   HPI: Billy [name] is an 56 y.o.white male with extensive medical history and substance abuse history who presented to the [facility] emergency department after a suicide attempt. Patient was going to attempt to hang himself however he had a syncopal episode and was unable to complete the task. Patient sustained a fall off of the chair he was on. He was found to have multiple fractures to his right foot for which orthopedics was consulted. X-rays were concerning for possible Lisfranc injury. CT scan confirmed multiple comminuted metatarsal fractures. There are also some osseous findings suggestive of Lisfranc injury however the Lisfranc joint itself is congruent. Orthopedic trauma service consulted for definitive recommendations.  Patient seen and evaluated in the emergency room 14. Sitter is in the room. Patient is in no distress at the current moment  Past Medical History:  Diagnosis Date  . Bipolar 1 disorder ([place])   . Chronic pain   . Depression   . Diabetes mellitus   . Diabetes mellitus without complication ([place])   . HCV (hepatitis C virus)   . Hepatitis C carrier ([place])   . Homeless   . Hypertension   . Suicide attempt ([place])   . WPW (Wolff-Parkinson-White syndrome)     Past Surgical History:  Procedure Laterality Date  . BACK SURGERY    . CHEST SURGERY    . RHINOPLASTY    . TONSILLECTOMY      Family History  Problem Relation Age of Onset  . Mental illness Neg Hx     Social History:  reports that he has been smoking Cigarettes.  He has a 8.00 pack-year smoking history. He has never used smokeless tobacco. He reports that he drinks alcohol. He reports that he uses drugs, including Marijuana, "Crack" cocaine, and Cocaine.  Allergies: No Known Allergies  Medications: I have reviewed the patient's current medications.  Results for  orders placed or performed during the hospital encounter of 11/24/16 (from the past 48 hour(s))  CBC WITH DIFFERENTIAL     Status: Abnormal   Collection Time: 11/24/16  1:18 PM  Result Value Ref Range   WBC 9.4 4.0 - 10.5 K/uL   RBC 5.03 4.22 - 5.81 MIL/uL   Hemoglobin 15.2 13.0 - 17.0 g/dL   HCT 42.5 39.0 - 52.0 %   MCV 84.5 78.0 - 100.0 fL   MCH 30.2 26.0 - 34.0 pg   MCHC 35.8 30.0 - 36.0 g/dL   RDW 12.6 11.5 - 15.5 %   Platelets 141 (L) 150 - 400 K/uL   Neutrophils Relative % 67 %   Neutro Abs 6.2 1.7 - 7.7 K/uL   Lymphocytes Relative 25 %   Lymphs Abs 2.4 0.7 - 4.0 K/uL   Monocytes Relative 7 %   Monocytes Absolute 0.7 0.1 - 1.0 K/uL   Eosinophils Relative 1 %   Eosinophils Absolute 0.1 0.0 - 0.7 K/uL   Basophils Relative 0 %   Basophils Absolute 0.0 0.0 - 0.1 K/uL  Comprehensive metabolic panel     Status: Abnormal   Collection Time: 11/24/16  1:18 PM  Result Value Ref Range   Sodium 134 (L) 135 - 145 mmol/L   Potassium 4.1 3.5 - 5.1 mmol/L   Chloride 103 101 - 111 mmol/L   CO2 22 22 - 32 mmol/L   Glucose, Bld 195 (H) 65 - 99 mg/dL  BUN 11 6 - 20 mg/dL   Creatinine, Ser 1.08 0.61 - 1.24 mg/dL   Calcium 9.2 8.9 - 10.3 mg/dL   Total Protein 7.1 6.5 - 8.1 g/dL   Albumin 4.1 3.5 - 5.0 g/dL   AST 16 15 - 41 U/L   ALT 10 (L) 17 - 63 U/L   Alkaline Phosphatase 61 38 - 126 U/L   Total Bilirubin 1.4 (H) 0.3 - 1.2 mg/dL   GFR calc non Af Amer >60 >60 mL/min   GFR calc Af Amer >60 >60 mL/min    Comment: (NOTE) The eGFR has been calculated using the CKD EPI equation. This calculation has not been validated in all clinical situations. eGFR's persistently <60 mL/min signify possible Chronic Kidney Disease.    Anion gap 9 5 - 15  Troponin I     Status: None   Collection Time: 11/24/16  1:18 PM  Result Value Ref Range   Troponin I <0.03 <0.03 ng/mL  Urinalysis, Routine w reflex microscopic     Status: Abnormal   Collection Time: 11/24/16  1:19 PM  Result Value Ref Range    Color, Urine YELLOW YELLOW   APPearance CLEAR CLEAR   Specific Gravity, Urine 1.005 1.005 - 1.030   pH 6.0 5.0 - 8.0   Glucose, UA >=500 (A) NEGATIVE mg/dL   Hgb urine dipstick NEGATIVE NEGATIVE   Bilirubin Urine NEGATIVE NEGATIVE   Ketones, ur NEGATIVE NEGATIVE mg/dL   Protein, ur NEGATIVE NEGATIVE mg/dL   Nitrite NEGATIVE NEGATIVE   Leukocytes, UA NEGATIVE NEGATIVE   RBC / HPF NONE SEEN 0 - 5 RBC/hpf   WBC, UA 0-5 0 - 5 WBC/hpf   Bacteria, UA NONE SEEN NONE SEEN   Squamous Epithelial / LPF NONE SEEN NONE SEEN  Salicylate level     Status: None   Collection Time: 11/24/16  1:19 PM  Result Value Ref Range   Salicylate Lvl <0.6 2.8 - 30.0 mg/dL  Acetaminophen level     Status: Abnormal   Collection Time: 11/24/16  1:19 PM  Result Value Ref Range   Acetaminophen (Tylenol), Serum <10 (L) 10 - 30 ug/mL    Comment:        THERAPEUTIC CONCENTRATIONS VARY SIGNIFICANTLY. A RANGE OF 10-30 ug/mL MAY BE AN EFFECTIVE CONCENTRATION FOR MANY PATIENTS. HOWEVER, SOME ARE BEST TREATED AT CONCENTRATIONS OUTSIDE THIS RANGE. ACETAMINOPHEN CONCENTRATIONS >150 ug/mL AT 4 HOURS AFTER INGESTION AND >50 ug/mL AT 12 HOURS AFTER INGESTION ARE OFTEN ASSOCIATED WITH TOXIC REACTIONS.   Rapid urine drug screen (hospital performed)     Status: None   Collection Time: 11/24/16  1:19 PM  Result Value Ref Range   Opiates NONE DETECTED NONE DETECTED   Cocaine NONE DETECTED NONE DETECTED   Benzodiazepines NONE DETECTED NONE DETECTED   Amphetamines NONE DETECTED NONE DETECTED   Tetrahydrocannabinol NONE DETECTED NONE DETECTED   Barbiturates NONE DETECTED NONE DETECTED    Comment:        DRUG SCREEN FOR MEDICAL PURPOSES ONLY.  IF CONFIRMATION IS NEEDED FOR ANY PURPOSE, NOTIFY LAB WITHIN 5 DAYS.        LOWEST DETECTABLE LIMITS FOR URINE DRUG SCREEN Drug Class       Cutoff (ng/mL) Amphetamine      1000 Barbiturate      200 Benzodiazepine   237 Tricyclics       628 Opiates          300 Cocaine  300 THC              50   CBG monitoring, ED     Status: Abnormal   Collection Time: 11/24/16  1:34 PM  Result Value Ref Range   Glucose-Capillary 211 (H) 65 - 99 mg/dL  POC CBG, ED     Status: Abnormal   Collection Time: 11/24/16  5:55 PM  Result Value Ref Range   Glucose-Capillary 169 (H) 65 - 99 mg/dL  POC CBG, ED     Status: Abnormal   Collection Time: 11/25/16  8:04 AM  Result Value Ref Range   Glucose-Capillary 148 (H) 65 - 99 mg/dL  POC CBG, ED     Status: Abnormal   Collection Time: 11/25/16  9:43 PM  Result Value Ref Range   Glucose-Capillary 182 (H) 65 - 99 mg/dL  POC CBG, ED     Status: Abnormal   Collection Time: 11/26/16  8:50 AM  Result Value Ref Range   Glucose-Capillary 193 (H) 65 - 99 mg/dL    Dg Chest 2 View  Result Date: 11/24/2016 CLINICAL DATA:  Status post fall.  Syncope. EXAM: CHEST  2 VIEW COMPARISON:  09/15/2015 FINDINGS: The heart size and mediastinal contours are within normal limits. Both lungs are clear. The visualized skeletal structures are unremarkable. IMPRESSION: No active cardiopulmonary disease. Electronically Signed   By: Elige Ko   On: 11/24/2016 14:19   Ct Foot Right Wo Contrast  Result Date: 11/25/2016 CLINICAL DATA:  He attempted to fashion a noose and hang himself but when he climbed up on the chair (before he put the noose on) he had another syncopal event and fell. EXAM: CT OF THE RIGHT FOOT WITHOUT CONTRAST TECHNIQUE: Multidetector CT imaging of the right foot was performed according to the standard protocol. Multiplanar CT image reconstructions were also generated. COMPARISON:  None. FINDINGS: Bones/Joint/Cartilage Acute nondisplaced fracture of the dorsal anterior medial cuneiform. Small nondisplaced fracture along the distal plantar aspect of the medial cuneiform. Minimally displaced fracture of the proximal plantar base of the first metatarsal. Comminuted fracture of the base of the second metatarsal involving the articular  surface. Severely comminuted fracture of the base of the third metatarsal with 10 mm of distraction both medially hand laterally. Comminuted fracture of the base of the fourth metatarsal without significant displacement. Nondisplaced fracture of the distal lateral plantar aspect of the cuboid. Nondisplaced fracture along the distal dorsal aspect of the talus likely reflecting an avulsion fracture. Small bony fragment adjacent to the lateral malleolus likely reflecting sequela of avulsive injury. Nondisplaced fracture of the third metatarsal neck. Ankle mortise is intact. Lisfranc joint is congruent. No aggressive osseous lesion. Ligaments Ligaments are suboptimally evaluated by CT. Muscles and Tendons Muscles are normal. Flexor, extensor, peroneal and Achilles tendons are grossly intact. Soft tissue No fluid collection or hematoma. No soft tissue mass. Severe soft tissue edema involving the foot. IMPRESSION: 1. Acute nondisplaced fracture of the dorsal anterior medial cuneiform. Small nondisplaced fracture along the distal plantar aspect of the medial cuneiform. Minimally displaced fracture of the proximal plantar base of the first metatarsal. 2. Comminuted fracture of the base of the second metatarsal involving the articular surface. 3. Severely comminuted fracture of the base of the third metatarsal with 10 mm of distraction both medially hand laterally. 4. Comminuted fracture of the base of the fourth metatarsal without significant displacement. 5. Nondisplaced fracture of the distal lateral plantar aspect of the cuboid. 6. Nondisplaced fracture along the distal dorsal  aspect of the talus likely reflecting an avulsion fracture. 7. Small bony fragment adjacent to the lateral malleolus likely reflecting sequela of avulsive injury. 8. Lisfranc joint is congruent. Electronically Signed   By: Kathreen Devoid   On: 11/25/2016 16:57   Dg Foot Complete Right  Result Date: 11/24/2016 CLINICAL DATA:  Initial evaluation  for acute trauma, fall. EXAM: RIGHT FOOT COMPLETE - 3+ VIEW COMPARISON:  None. FINDINGS: Acute transverse comminuted fracture extends through the base of the right second metatarsal without significant displacement. LisFranc joint grossly approximated. Additional acute comminuted fracture extends through the base of the right fourth metatarsal, and likely the third metatarsal as well. No other acute fracture or dislocation. Diffuse soft tissue swelling present about the foot. IMPRESSION: Acute comminuted fractures through the bases of the right second and fourth metatarsals, and likely the right third metatarsal as well. While the Lisfranc joint is grossly approximated, possible Lisfranc injury should be considered. Electronically Signed   By: Jeannine Boga M.D.   On: 11/24/2016 14:24    Review of Systems  Cardiovascular: Negative for chest pain.  Gastrointestinal: Negative for nausea and vomiting.  Musculoskeletal:       Right foot pain  Neurological: Negative for tingling and sensory change.   Blood pressure 111/71, pulse 67, temperature 97.5 F (36.4 C), temperature source Oral, resp. rate 18, height 6' (1.829 m), weight 88.5 kg (195 lb), SpO2 99 %. Physical Exam  Constitutional:  Older appearing white male, no acute distress  Musculoskeletal:  Right lower extremity Inspection:   Posterior short leg splint is in place   It is well padded    Moderate swelling present   Knee unremarkable   Splint removed by myself to look at soft tissue  Bony eval:   Moderate tenderness to midfoot    No ankle or hindfoot pain  Soft tissue:   No open wounds or lesion    Moderate swelling    Ecchymosis medially    Pt does have some plantar ecchymosis along his arch and laterally    ROM:    Active motion of toes and ankle noted Sensation:    DPN, SPN, TN sensation intact Motor:     EHL, FHL, lesser toe motor intact    Ankle flexion and extension intact Vascular:    Ext warm    + DP  pulse    No pain with passive stretch of foot compartments    Compartments of foot are relatively soft       Assessment/Plan:  56 y/o white male s/p suicide attempt with R foot fractures  - comminuted R metatarsal fractures, multiple tarsal bone fractures, R lisfranc injury with good alignment  Non-op   Place in CAM boot  NWB x 6-8 weeks  Wheelchair while in behavorial health  Follow up with ortho (Dr. Marcelino Scot in 2 weeks)  Ice and elevate leg above heart when able    Would recommend cessation of ibuprofen. Please avoid NSAIDs as these delay bone healing  also recommend discontinuation of nicotine patch. Nicotine also increases the chances of a nonunion  - Dispo:  Per Behavioral health     I did communicate with the NP for behavioral health to inform of the ortho plan and changes in immobilization device   Jari Pigg, PA-C [facility] [phone] (P) 11/26/2016, 11:26 AM

## 2016-11-26 NOTE — ED Notes (Signed)
Attempted to call report. Held for 15 minutes and no one came to receive report.

## 2016-11-26 NOTE — Consult Note (Signed)
[facility]   Reason for Consult:  Suicidal ideation with attempt Referring Physician:  EDP Patient Identification: Billy FlakesMelvin K Dorminey MRN:  [id number] Principal Diagnosis: Attempted suicide [facility](HCC)  Diagnosis:   Patient Active Problem List   Diagnosis Date Noted  . Suicidal ideation [R45.851]     Priority: High  . Bipolar affective disorder, depressed (HCC) [F31.30] 08/09/2015    Priority: High  . Attempted suicide (HCC) [T14.91XA] 11/25/2016  . Bipolar disorder, curr episode mixed, severe, w/o psychotic features (HCC) [F31.63] 05/17/2016  . Cocaine use disorder, severe, dependence (HCC) [F14.20] 05/17/2016  . Affective psychosis, bipolar (HCC) [F31.9]   . Bipolar disorder with current episode depressed (HCC) [F31.30] 08/09/2015  . Polysubstance abuse [F19.10]   . Type 2 diabetes mellitus with hyperglycemia (HCC) [E11.65] 09/19/2014  . Right leg DVT (HCC) [I82.401] 09/19/2014  . Essential hypertension [I10] 09/19/2014  . Bipolar affective disorder, depressed, severe, with psychotic behavior (HCC) [F31.5] 09/14/2014  . Chronic pain syndrome [G89.4] 09/14/2014  . Cocaine abuse [F14.10] 09/13/2014  . Alcohol use disorder, mild, abuse [F10.10] 10/12/2012  . Homeless [Z59.0] 09/02/2011  . Opiate abuse, episodic [F11.10] 09/02/2011  . Diabetes mellitus [250] 08/30/2011  . WPW (Wolff-Parkinson-White syndrome) [I45.6] 08/30/2011    Total Time spent with patient: 30 minutes  Subjective:   Billy [name] is a 56 y.o. male patient admitted with suicide attempt.  HPI:  Billy [name] is a 56 year old male [facility]. Pt stated he braided some plastic bags together but when he got up on the stool he passed out, fell, and was not able to get the ligature around his neck. Pt stated he is depressed. Pt stated that when he fell he broke some bones in his foot. Pt is currently wearing a soft cast. Pt lives alone and has no family  in this area. Pt stated he has two children who live in [place]. [facility] but has not been in two months because he can't afford the co-pay. Pt has been off his medications for two months. Ortho cleared Pt and removed the soft cast. Pt was placed in a CAM boot and is non weight bearing to his right foot and must use a wheelchair.   Pt's BAL and UDS negative.   Past Psychiatric History: Cocaine use disorder, Bipolar affective disorder, chronic pain syndrome, Affective psychosis, bipolar  Risk to Self: Suicidal Ideation: Yes-Currently Present Suicidal Intent: Yes-Currently Present Is patient at risk for suicide?: Yes Suicidal Plan?: Yes-Currently Present Specify Current Suicidal Plan: tried to hang himself Access to Means: Yes Specify Access to Suicidal Means: access to rope What has been your use of drugs/alcohol within the last 12 months?: used THC and cocaine  2 weeks ago How many times?: 5 Other Self Harm Risks: NA Triggers for Past Attempts: Unknown Intentional Self Injurious Behavior: None Risk to Others: Homicidal Ideation: No Thoughts of Harm to Others: No Current Homicidal Intent: No Current Homicidal Plan: No Access to Homicidal Means: No Identified Victim: none History of harm to others?: No Assessment of Violence: None Noted Violent Behavior Description: no Does patient have access to weapons?: No Criminal Charges Pending?: No Does patient have a court date: No Prior Inpatient Therapy: Prior Inpatient Therapy: No Prior Therapy Dates: multi [facility] Prior Outpatient Therapy: Prior Outpatient Therapy: No Prior Therapy Dates: multi Prior Therapy Facilty/Provider(s): [facility] Reason  for Treatment: Depression Does patient have an ACCT team?: No Does patient have Intensive In-House Services?  : No Does patient have [facility] services? : No Does patient have  P4CC services?: No  Past Medical History:  Past Medical History:  Diagnosis Date  . Bipolar 1 disorder (HCC)   . Chronic pain   . Depression   . Diabetes mellitus   . Diabetes mellitus without complication (HCC)   . HCV (hepatitis C virus)   . Hepatitis C carrier (HCC)   . Homeless   . Hypertension   . Suicide attempt (HCC)   . WPW (Wolff-Parkinson-White syndrome)     Past Surgical History:  Procedure Laterality Date  . BACK SURGERY    . CHEST SURGERY    . RHINOPLASTY    . TONSILLECTOMY     Family History:  Family History  Problem Relation Age of Onset  . Mental illness Neg Hx    Family Psychiatric  History: Unknown Social History:  History  Alcohol Use  . Yes    Comment: last drink 24 days     History  Drug Use  . Types: Marijuana, "Crack" cocaine, Cocaine    Comment: last used 09/03/2014    Social History   Social History  . Marital status: Divorced    Spouse name: N/A  . Number of children: N/A  . Years of education: N/A   Social History Main Topics  . Smoking status: Current Every Day Smoker    Packs/day: 1.00    Years: 8.00    Types: Cigarettes  . Smokeless tobacco: Never Used  . Alcohol use Yes     Comment: last drink 24 days  . Drug use: Yes    Types: Marijuana, "Crack" cocaine, Cocaine     Comment: last used 09/03/2014  . Sexual activity: Not Asked   Other Topics Concern  . None   Social History Narrative   ** Merged History Encounter **       Additional Social History:    Allergies:  No Known Allergies  Labs:  Results for orders placed or performed during the hospital encounter of 11/24/16 (from the past 48 hour(s))  POC CBG, ED     Status: Abnormal   Collection Time: 11/24/16  5:55 PM  Result Value Ref Range   Glucose-Capillary 169 (H) 65 - 99 mg/dL  POC CBG, ED     Status: Abnormal   Collection Time: 11/25/16  8:04 AM  Result Value Ref Range   Glucose-Capillary 148 (H) 65 - 99 mg/dL  POC CBG, ED     Status: Abnormal    Collection Time: 11/25/16  9:43 PM  Result Value Ref Range   Glucose-Capillary 182 (H) 65 - 99 mg/dL  POC CBG, ED     Status: Abnormal   Collection Time: 11/26/16  8:50 AM  Result Value Ref Range   Glucose-Capillary 193 (H) 65 - 99 mg/dL    Current Facility-Administered Medications  Medication Dose Route Frequency Provider Last Rate Last Dose  . 0.9 %  sodium chloride infusion   Intravenous Continuous Jacalyn Lefevre, MD 125 mL/hr at 11/24/16 1346    . acetaminophen (TYLENOL) tablet 650 mg  650 mg Oral Q4H PRN Jacalyn Lefevre, MD      . albuterol (PROVENTIL HFA;VENTOLIN HFA) 108 (90 Base) MCG/ACT inhaler 2 puff  2 puff Inhalation Q6H PRN Jacalyn Lefevre, MD      . alum & mag hydroxide-simeth (MAALOX/MYLANTA) 200-200-20 MG/5ML suspension 30 mL  30 mL Oral  Q6H PRN Jacalyn Lefevre, MD      . ARIPiprazole (ABILIFY) tablet 7.5 mg  7.5 mg Oral BID Jacalyn Lefevre, MD   7.5 mg at 11/26/16 1051  . canagliflozin (INVOKANA) tablet 300 mg  300 mg Oral Daily Jacalyn Lefevre, MD   300 mg at 11/26/16 1051  . citalopram (CELEXA) tablet 20 mg  20 mg Oral Daily Jacalyn Lefevre, MD   20 mg at 11/26/16 1051  . cloNIDine (CATAPRES) tablet 0.2 mg  0.2 mg Oral BID Rolland Porter, MD   0.2 mg at 11/26/16 1052  . fluticasone (FLONASE) 50 MCG/ACT nasal spray 1 spray  1 spray Each Nare Daily Rolland Porter, MD   1 spray at 11/25/16 1536  . gabapentin (NEURONTIN) capsule 1,200 mg  1,200 mg Oral TID Jacalyn Lefevre, MD   1,200 mg at 11/26/16 1051  . hydrOXYzine (ATARAX/VISTARIL) tablet 50 mg  50 mg Oral QHS,MR X 1 Jacalyn Lefevre, MD   50 mg at 11/25/16 2313  . ibuprofen (ADVIL,MOTRIN) tablet 600 mg  600 mg Oral Q8H PRN Jacalyn Lefevre, MD   600 mg at 11/26/16 1051  . insulin glargine (LANTUS) injection 60 Units  60 Units Subcutaneous QHS Jacalyn Lefevre, MD   60 Units at 11/25/16 2313  . nicotine (NICODERM CQ - dosed in mg/24 hours) patch 21 mg  21 mg Transdermal Daily Jacalyn Lefevre, MD   21 mg at 11/26/16 1052  .  ondansetron (ZOFRAN) tablet 4 mg  4 mg Oral Q8H PRN Jacalyn Lefevre, MD      . pantoprazole (PROTONIX) EC tablet 40 mg  40 mg Oral Daily Jacalyn Lefevre, MD   40 mg at 11/26/16 1051  . rivaroxaban (XARELTO) tablet 20 mg  20 mg Oral Q supper Benjiman Core, MD   20 mg at 11/25/16 1756  . tiotropium (SPIRIVA) inhalation capsule 18 mcg  18 mcg Inhalation Daily Jacalyn Lefevre, MD   18 mcg at 11/24/16 2231  . traZODone (DESYREL) tablet 200 mg  200 mg Oral QHS Jacalyn Lefevre, MD   200 mg at 11/25/16 2149  . zolpidem (AMBIEN) tablet 5 mg  5 mg Oral QHS PRN Jacalyn Lefevre, MD   5 mg at 11/25/16 2312   Current Outpatient Prescriptions  Medication Sig Dispense Refill  . albuterol (PROAIR HFA) 108 (90 Base) MCG/ACT inhaler Inhale 2 puffs into the lungs every 6 (six) hours as needed for shortness of breath. 1 Inhaler 0  . hydrOXYzine (ATARAX/VISTARIL) 50 MG tablet Take 1 tablet (50 mg total) by mouth at bedtime and may repeat dose one time if needed. For anxiety/insomnia 60 tablet 0  . insulin glargine (LANTUS) 100 UNIT/ML injection Inject 0.6 mLs (60 Units total) into the skin at bedtime. For diabetes managment 10 mL 0  . omeprazole (PRILOSEC) 20 MG capsule Take 2 capsules (40 mg total) by mouth daily. For acid reflux    . tiotropium (SPIRIVA) 18 MCG inhalation capsule Place 1 capsule (18 mcg total) into inhaler and inhale daily. For COPD 30 capsule 12  . tiZANidine (ZANAFLEX) 2 MG tablet Take 2 tablets (4 mg total) by mouth 3 (three) times daily. For pain 1 tablet 0  . traZODone (DESYREL) 100 MG tablet Take 2 tablets (200 mg total) by mouth at bedtime. For sleep 60 tablet 0  . ARIPiprazole (ABILIFY) 15 MG tablet Take 0.5 tablets (7.5 mg total) by mouth 2 (two) times daily. For mood control (Patient not taking: Reported on 11/24/2016) 60 tablet 0  . citalopram (CELEXA) 20 MG  tablet Take 1 tablet (20 mg total) by mouth daily. For depression (Patient not taking: Reported on 11/24/2016) 30 tablet 0  .  gabapentin (NEURONTIN) 400 MG capsule Take 3 capsules (1,200 mg total) by mouth 3 (three) times daily. For agitation (Patient not taking: Reported on 11/24/2016) 270 capsule 0  . INVOKANA 300 MG TABS tablet Take 1 tablet (300 mg total) by mouth daily. For diabetes management (Patient not taking: Reported on 11/24/2016) 1 tablet 0  . lisinopril (PRINIVIL,ZESTRIL) 20 MG tablet Take 1 tablet (20 mg total) by mouth daily. For high blood pressure (Patient not taking: Reported on 11/24/2016) 1 tablet 0  . nicotine (NICODERM CQ - DOSED IN MG/24 HOURS) 21 mg/24hr patch Place 1 patch (21 mg total) onto the skin daily at 6 (six) AM. For smoking cessation (Patient not taking: Reported on 11/24/2016) 28 patch 0  . oxymetazoline (AFRIN) 0.05 % nasal spray Place 1 spray into both nostrils 2 (two) times daily as needed for congestion. (Patient not taking: Reported on 11/24/2016) 30 mL 0  . XARELTO 20 MG TABS tablet Take 1 tablet (20 mg total) by mouth daily. For prevention of blood clot (Patient not taking: Reported on 11/24/2016) 1 tablet 0    Musculoskeletal: Strength & Muscle Tone: within normal limits Gait & Station: normal Patient leans: N/A  Psychiatric Specialty Exam: Physical Exam  Constitutional: He appears well-developed and well-nourished.  HENT:  Head: Normocephalic.  Neck: Normal range of motion.  Respiratory: Effort normal.  Musculoskeletal: Normal range of motion.  Neurological: He is alert.  Psychiatric: His speech is normal and behavior is normal. Cognition and memory are normal. He expresses impulsivity. He exhibits a depressed mood. He expresses suicidal ideation.    Review of Systems  Psychiatric/Behavioral: Positive for depression and suicidal ideas. Negative for hallucinations, memory loss and substance abuse. The patient is not nervous/anxious and does not have insomnia.     Blood pressure 117/89, pulse 75, temperature 97.9 F (36.6 C), temperature source Oral, resp. rate 18, height 6'  (1.829 m), weight 88.5 kg (195 lb), SpO2 97 %.Body mass index is 26.45 kg/m.  General Appearance: Casual  Eye Contact:  Good  Speech:  Clear and Coherent and Normal Rate  Volume:  Normal  Mood:  Depressed, Hopeless and Worthless  Affect:  Congruent, Depressed and Flat  Thought Process:  Coherent, Goal Directed and Linear  Orientation:  Full (Time, Place, and Person)  Thought Content:  Logical  Suicidal Thoughts:  Yes.  with intent/plan  Homicidal Thoughts:  No  Memory:  Immediate;   Good Recent;   Fair Remote;   Fair  Judgement:  Poor  Insight:  Lacking  Psychomotor Activity:  Normal  Concentration:  Concentration: Good and Attention Span: Good  Recall:  Good  Fund of Knowledge:  Good  Language:  Good  Akathisia:  No  Handed:  Right  AIMS (if indicated):     Assets:  Communication Skills Desire for Improvement Financial Resources/Insurance Housing Social Support  ADL's:  Intact  Cognition:  WNL  Sleep:        Treatment Plan Summary: Daily contact with patient to assess and evaluate symptoms and progress in treatment, Medication management and Plan Attempted Suicide  Crisis Stabilization Continue currently prescribed medications (See MAR)   Disposition: Recommend psychiatric Inpatient admission when medically cleared. Pt accepted at [facility] 400-2, may go at 2:00 PM  Laveda Abbe, NP 11/26/2016 1:40 PM  Patient seen face-to-face for psychiatric evaluation, chart reviewed and case  discussed with the physician extender and developed treatment plan. Reviewed the information documented and agree with the treatment plan. Corena Pilgrim, MD

## 2016-11-26 NOTE — Progress Notes (Signed)
Chaplain answered a page to visit patient.  Patient says he didn't request a Chaplain visit but wouldn't mind talking to me.  Patient says I can pray for him.  Patient explained how he arrived in the Emergency Room, he states he was trying to kill himself and had a fainting episode as he was trying to tie the final not in his noose.  Patient says he is glad that it didn't work but that life gets hard sometimes.    Patient thanks Chaplain for visit.  Chaplain prays with and for patient.    11/26/16 1413  Clinical Encounter Type  Visited With Patient  Visit Type Initial;Psychological support;Spiritual support;Social support  Referral From Nurse  Consult/Referral To Physician;Chaplain  Spiritual Encounters  Spiritual Needs Prayer  Stress Factors  Patient Stress Factors Other (Comment) (Patient says he is depressed)

## 2016-11-27 ENCOUNTER — Encounter (HOSPITAL_COMMUNITY): Payer: Self-pay | Admitting: *Deleted

## 2016-11-27 DIAGNOSIS — X789XXA Intentional self-harm by unspecified sharp object, initial encounter: Secondary | ICD-10-CM

## 2016-11-27 LAB — GLUCOSE, CAPILLARY
Glucose-Capillary: 145 mg/dL — ABNORMAL HIGH (ref 65–99)
Glucose-Capillary: 147 mg/dL — ABNORMAL HIGH (ref 65–99)
Glucose-Capillary: 183 mg/dL — ABNORMAL HIGH (ref 65–99)
Glucose-Capillary: 182 mg/dL — ABNORMAL HIGH (ref 65–99)

## 2016-11-27 MED ORDER — FLUTICASONE PROPIONATE 50 MCG/ACT NA SUSP
1.0000 | Freq: Two times a day (BID) | NASAL | Status: DC
  Administered 2016-11-27 – 2016-12-04 (×14): 1 via NASAL

## 2016-11-27 MED ORDER — TRAZODONE HCL 100 MG PO TABS
100.0000 mg | ORAL_TABLET | Freq: Every evening | ORAL | Status: DC | PRN
  Administered 2016-11-27 – 2016-12-03 (×7): 100 mg via ORAL
  Filled 2016-11-27 (×6): qty 1

## 2016-11-27 MED ORDER — CITALOPRAM HYDROBROMIDE 10 MG PO TABS
10.0000 mg | ORAL_TABLET | Freq: Every day | ORAL | Status: DC
  Administered 2016-11-28 – 2016-12-02 (×5): 10 mg via ORAL
  Filled 2016-11-27 (×7): qty 1

## 2016-11-27 MED ORDER — GABAPENTIN 300 MG PO CAPS
300.0000 mg | ORAL_CAPSULE | Freq: Three times a day (TID) | ORAL | Status: DC
  Administered 2016-11-28 – 2016-12-04 (×20): 300 mg via ORAL
  Filled 2016-11-27 (×26): qty 1

## 2016-11-27 MED ORDER — NICOTINE 21 MG/24HR TD PT24
21.0000 mg | MEDICATED_PATCH | Freq: Every day | TRANSDERMAL | Status: DC
Start: 2016-11-27 — End: 2016-12-04
  Administered 2016-11-27 – 2016-12-04 (×8): 21 mg via TRANSDERMAL
  Filled 2016-11-27 (×10): qty 1

## 2016-11-27 MED ORDER — IBUPROFEN 600 MG PO TABS
600.0000 mg | ORAL_TABLET | Freq: Four times a day (QID) | ORAL | Status: DC | PRN
  Administered 2016-11-27 – 2016-12-01 (×8): 600 mg via ORAL
  Filled 2016-11-27 (×8): qty 1

## 2016-11-27 NOTE — Social Work (Addendum)
[facility], [place] resident.  Patient accepted service beginning on day of discharge.    Santa GeneraAnne Cunningham, LCSW Lead Clinical Social Worker Phone:  [phone]

## 2016-11-27 NOTE — [facility] Suicide Risk Assessment (Addendum)
[facility] Admission Suicide Risk Assessment   Nursing information obtained from:  Patient Demographic factors:  Male, Caucasian, Low socioeconomic status, Living alone, Unemployed Current Mental Status:  Suicidal ideation indicated by patient, Suicide plan, Plan includes specific time, place, or method, Self-harm thoughts, Self-harm behaviors Loss Factors:  Financial problems / change in socioeconomic status Historical Factors:  Prior suicide attempts Risk Reduction Factors:  NA  Total Time spent with patient: 45 minutes  Principal Problem: MDD (major depressive disorder), recurrent severe, without psychosis (HCC) Diagnosis:   Patient Active Problem List   Diagnosis Date Noted  . MDD (major depressive disorder), recurrent severe, without psychosis (HCC) [F33.2] 11/26/2016  . Suicide attempt (HCC) [T14.91XA] 11/25/2016  . Suicidal ideation [R45.851]   . Bipolar disorder, curr episode mixed, severe, w/o psychotic features (HCC) [F31.63] 05/17/2016  . Cocaine use disorder, severe, dependence (HCC) [F14.20] 05/17/2016  . Affective psychosis, bipolar (HCC) [F31.9]   . Bipolar affective disorder, depressed (HCC) [F31.30] 08/09/2015  . Bipolar disorder with current episode depressed (HCC) [F31.30] 08/09/2015  . Polysubstance abuse [F19.10]   . Type 2 diabetes mellitus with hyperglycemia (HCC) [E11.65] 09/19/2014  . Right leg DVT (HCC) [I82.401] 09/19/2014  . Essential hypertension [I10] 09/19/2014  . Bipolar affective disorder, depressed, severe, with psychotic behavior (HCC) [F31.5] 09/14/2014  . Chronic pain syndrome [G89.4] 09/14/2014  . Cocaine abuse [F14.10] 09/13/2014  . Alcohol use disorder, mild, abuse [F10.10] 10/12/2012  . Homeless [Z59.0] 09/02/2011  . Opiate abuse, episodic [F11.10] 09/02/2011  . Diabetes mellitus [250] 08/30/2011  . WPW (Wolff-Parkinson-White syndrome) [I45.6] 08/30/2011    Continued Clinical Symptoms:  Alcohol Use Disorder Identification Test Final Score  (AUDIT): 0 The "Alcohol Use Disorders Identification Test", Guidelines for Use in Primary Care, Second Edition.  World Science writer [facility]). Score between 0-7:  no or low risk or alcohol related problems. Score between 8-15:  moderate risk of alcohol related problems. Score between 16-19:  high risk of alcohol related problems. Score 20 or above:  warrants further diagnostic evaluation for alcohol dependence and treatment.   CLINICAL FACTORS:  56 year old male, divorced, has two adult children, lives alone, on disability. He presented to the hospital voluntarily. He reports recent suicide attempt by hanging . States " I fainted, and I fell, and I broke a couple of toes ". Reports he has been struggling with depression for several months. He reports limited social support and loneliness are contributing factors . States he has been off his psychiatric medications for about a month . Of note, admission UDS negative.  He has a history of prior psychiatric admissions, most recently 12/17, for depression and SI. At that time was discharged on Celexa, Abilify, Neurontin, Trazodone . States he has been diagnosed with Bipolar Disorder, but states " I only get depressed, I have not been manic in many years ".  History of alcohol use disorder, but states he has not been drinking for several years . Reports occasional cocaine use, which he states is " about one a month". Medical history is remarkable for DM, HTN, Hep C, COPD. NKDA.  Dx- Bipolar Disorder, Depressed   Plan- He has been restarted on Abilify, Celexa, Neurontin which he states were helping and were well tolerated  Will decrease dose of Celexa to 10 mgrs QDAY and of Neurontin to 300 mgrs TID, as patient had not been taking medications prior to admission. Will titrate as tolerated  Recheck TSH as was was low 12/17  Musculoskeletal: Strength & Muscle Tone: within normal  limits Gait & Station: currently mobilizing in wheel chair due to  toe fracture Patient leans: N/A  Psychiatric Specialty Exam: Physical Exam  ROS denies chest pain, no shortness of breath, no vomiting   Blood pressure 117/74, pulse 82, temperature 97.7 F (36.5 C), temperature source Oral, resp. rate 18, height 5\' 9"  (1.753 m), weight 85.7 kg (189 lb).Body mass index is 27.91 kg/m.  General Appearance: Fairly Groomed  Eye Contact:  Good  Speech:  Slow  Volume:  Decreased  Mood:  depressed   Affect:  constricted  Thought Process:  Linear and Descriptions of Associations: Intact  Orientation:  Other:  fully alert and attentive   Thought Content:  no hallucinations, no delusions   Suicidal Thoughts:  No denies any current suicidal or self injurious ideations, denies any homicidal or violent ideations  Homicidal Thoughts:  No  Memory:  recent and remote grossly intact   Judgement:  Fair  Insight:  Fair  Psychomotor Activity:  Decreased  Concentration:  Concentration: Good and Attention Span: Good  Recall:  Good  Fund of Knowledge:  Good  Language:  Fair  Akathisia:  Negative  Handed:  Right  AIMS (if indicated):     Assets:  Communication Skills Desire for Improvement Resilience  ADL's: fair   Cognition:  WNL  Sleep:  Number of Hours: 6.5      COGNITIVE FEATURES THAT CONTRIBUTE TO RISK:  Closed-mindedness and Loss of executive function    SUICIDE RISK:   Moderate:  Frequent suicidal ideation with limited intensity, and duration, some specificity in terms of plans, no associated intent, good self-control, limited dysphoria/symptomatology, some risk factors present, and identifiable protective factors, including available and accessible social support.  PLAN OF CARE: Patient will be admitted to inpatient psychiatric unit for stabilization and safety. Will provide and encourage milieu participation. Provide medication management and maked adjustments as needed.  Will follow daily.    I certify that inpatient services furnished can  reasonably be expected to improve the patient's condition.   Craige CottaFernando A Cobos, MD 11/27/2016, 5:12 PM

## 2016-11-27 NOTE — [facility] Group Notes (Signed)
[facility] LCSW Group Therapy 11/27/2016 1:15pm  Type of Therapy and Topic: Group Therapy: Avoiding Self-Sabotaging and Enabling Behaviors   Participation Level: Pt invited. Did not attend.   Donnelly StagerLynn Nathaniel Yaden, MSW, [facility] 11/27/2016 4:45 PM

## 2016-11-27 NOTE — Progress Notes (Signed)
Pt did not attend wrap-up group   

## 2016-11-27 NOTE — Tx Team (Signed)
Initial Treatment Plan 11/27/2016 1:37 AM Billy FlakesMelvin K Restivo [id number]    PATIENT STRESSORS: Substance abuse Other: Being alone   PATIENT STRENGTHS: Average or above average intelligence Capable of independent living Motivation for treatment/growth   PATIENT IDENTIFIED PROBLEMS: Substance abuse  Suicide  "Leave thinking better thoughts"  "Get my medicines straight"               DISCHARGE CRITERIA:  Ability to meet basic life and health needs Improved stabilization in mood, thinking, and/or behavior Motivation to continue treatment in a less acute level of care Need for constant or close observation no longer present Verbal commitment to aftercare and medication compliance Withdrawal symptoms are absent or subacute and managed without 24-hour nursing intervention  PRELIMINARY DISCHARGE PLAN: Attend 12-step recovery group Return to previous living arrangement  PATIENT/FAMILY INVOLVEMENT: This treatment plan has been presented to and reviewed with the patient, Billy [name].  The patient and family have been given the opportunity to ask questions and make suggestions.  Carleene OverlieMiddleton, Keiri Solano P, RN 11/27/2016, 1:37 AM

## 2016-11-27 NOTE — H&P (Signed)
Psychiatric Admission Assessment Adult  Patient Identification: Billy [name] MRN:  [id number] Date of Evaluation:  11/27/2016 Chief Complaint:  BIPOLAR AFFECTIVE DISORDER Principal Diagnosis: MDD (major depressive disorder), recurrent severe, without psychosis (HCC) Diagnosis:   Patient Active Problem List   Diagnosis Date Noted  . MDD (major depressive disorder), recurrent severe, without psychosis (HCC) [F33.2] 11/26/2016  . Attempted suicide (HCC) [T14.91XA] 11/25/2016  . Suicidal ideation [R45.851]   . Bipolar disorder, curr episode mixed, severe, w/o psychotic features (HCC) [F31.63] 05/17/2016  . Cocaine use disorder, severe, dependence (HCC) [F14.20] 05/17/2016  . Affective psychosis, bipolar (HCC) [F31.9]   . Bipolar affective disorder, depressed (HCC) [F31.30] 08/09/2015  . Bipolar disorder with current episode depressed (HCC) [F31.30] 08/09/2015  . Polysubstance abuse [F19.10]   . Type 2 diabetes mellitus with hyperglycemia (HCC) [E11.65] 09/19/2014  . Right leg DVT (HCC) [I82.401] 09/19/2014  . Essential hypertension [I10] 09/19/2014  . Bipolar affective disorder, depressed, severe, with psychotic behavior (HCC) [F31.5] 09/14/2014  . Chronic pain syndrome [G89.4] 09/14/2014  . Cocaine abuse [F14.10] 09/13/2014  . Alcohol use disorder, mild, abuse [F10.10] 10/12/2012  . Homeless [Z59.0] 09/02/2011  . Opiate abuse, episodic [F11.10] 09/02/2011  . Diabetes mellitus [250] 08/30/2011  . WPW (Wolff-Parkinson-White syndrome) [I45.6] 08/30/2011    History of Present Illness: 56 year old male admitted to [facility] after he tried to hang himself in an intentional SA. Patient acknowledges he reason for admission.He reports a psychiatric history of Bipolar disorder. He endorses he was having thoughts of wanting to die after suffering from a long term history of depression. He reports multiple (at least 5-6 per patient report) SA in the past with last attempt unknown. He describes daily  suicidal ideations and daily depression describing current depressive symptoms as hopelessness. He reports a history of anxiety and describes symptoms as excessive worrying. Reports a history of substance use that includes THC and cocaine (cocaine use at least once pe rmonth). He denies Hx of alcohol use. Report multiple prior inpatient psychiatric hospitalizations. Reports recent outpatient therapy with Synetta Fail and medication management through [facility] however reports 2 weeks ago the services were discontinued because they no longer accepted his insurance. Reports current medications as Celexa, Trazadone, Vistaril, and Abilify. Reports he has been on both Celexa and Trazodone for 10-15 years without recent adjustments. Both Medical Hx and Social Hx as note below. He denies Hx of self-in behavior or AVH. At this time, patient mood appears depressed and his affect congruent. He ambulates in a while chair as her endorses that he injured three toes after a fall. He denies SI, HI, or AVH and is able to contract for safety at this time. He doe snot apepar to be in any distress. Denies withdrawal symptoms.   Social Hx-Live alone. Has 2 kids. Divorced. Unemployed currently disabled. Smokes cigarettes.   Medical Hx-diabetes, COPD, HTN, and Hep C. Hx of multiple back surgeries.   Substance Abuse Hx- Cocaine & THC.  Associated Signs/Symptoms: Depression Symptoms:  depressed mood, hopelessness, suicidal thoughts with specific plan, suicidal attempt, anxiety, (Hypo) Manic Symptoms:  none Anxiety Symptoms:  Excessive Worry, Psychotic Symptoms:  none PTSD Symptoms: NA Total Time spent with patient: 1 hour  Past Psychiatric History: Bipolar disorder, MDD, anxiety, multiple SA, multiple psychiatric admissions.   Is the patient at risk to self? Yes.    Has the patient been a risk to self in the past 6 months? Yes.    Has the patient been a risk to self  within the distant past? Yes.    Is the patient a  risk to others? No.  Has the patient been a risk to others in the past 6 months? No.  Has the patient been a risk to others within the distant past? No.   Prior Inpatient Therapy:   See above Prior Outpatient Therapy:  See above   Alcohol Screening: 1. How often do you have a drink containing alcohol?: Never 9. Have you or someone else been injured as a result of your drinking?: No 10. Has a relative or friend or a doctor or another health worker been concerned about your drinking or suggested you cut down?: No Alcohol Use Disorder Identification Test Final Score (AUDIT): 0 Brief Intervention: AUDIT score less than 7 or less-screening does not suggest unhealthy drinking-brief intervention not indicated Substance Abuse History in the last 12 months:  Yes.   Consequences of Substance Abuse: NA Previous Psychotropic Medications: Yes  Psychological Evaluations: No  Past Medical History:  Past Medical History:  Diagnosis Date  . Bipolar 1 disorder (HCC)   . Chronic pain   . Depression   . Diabetes mellitus   . Diabetes mellitus without complication (HCC)   . HCV (hepatitis C virus)   . Hepatitis C carrier (HCC)   . Homeless   . Hypertension   . Suicide attempt (HCC)   . WPW (Wolff-Parkinson-White syndrome)     Past Surgical History:  Procedure Laterality Date  . BACK SURGERY    . CHEST SURGERY    . RHINOPLASTY    . TONSILLECTOMY     Family History:  Family History  Problem Relation Age of Onset  . Mental illness Neg Hx    Family Psychiatric  History: See below  Tobacco Screening: Have you used any form of tobacco in the last 30 days? (Cigarettes, Smokeless Tobacco, Cigars, and/or Pipes): Yes Tobacco use, Select all that apply: 5 or more cigarettes per day Are you interested in Tobacco Cessation Medications?: Yes, will notify MD for an order Counseled patient on smoking cessation including recognizing danger situations, developing coping skills and basic information about  quitting provided: Yes Social History:  History  Alcohol Use  . Yes    Comment: last drink 24 days     History  Drug Use  . Types: Marijuana, "Crack" cocaine, Cocaine    Comment: last used 09/03/2014    Additional Social History:    Allergies:  No Known Allergies Lab Results:  Results for orders placed or performed during the hospital encounter of 11/26/16 (from the past 48 hour(s))  Glucose, capillary     Status: Abnormal   Collection Time: 11/26/16  9:13 PM  Result Value Ref Range   Glucose-Capillary 151 (H) 65 - 99 mg/dL  Glucose, capillary     Status: Abnormal   Collection Time: 11/27/16  5:56 AM  Result Value Ref Range   Glucose-Capillary 145 (H) 65 - 99 mg/dL    Blood Alcohol level:  Lab Results  Component Value Date   ETH 71 (H) 05/15/2016   ETH <5 08/08/2015    Metabolic Disorder Labs:  Lab Results  Component Value Date   HGBA1C 8.7 (H) 05/17/2016   MPG 203 05/17/2016   MPG 146 09/14/2014   Lab Results  Component Value Date   PROLACTIN 10.8 05/19/2016   Lab Results  Component Value Date   CHOL 202 (H) 05/17/2016   TRIG 211 (H) 05/17/2016   HDL 38 (L) 05/17/2016   CHOLHDL 5.3  05/17/2016   VLDL 42 (H) 05/17/2016   LDLCALC 122 (H) 05/17/2016    Current Medications: Current Facility-Administered Medications  Medication Dose Route Frequency Provider Last Rate Last Dose  . acetaminophen (TYLENOL) tablet 650 mg  650 mg Oral Q6H PRN Laveda Abbe, NP   650 mg at 11/27/16 4098  . albuterol (PROVENTIL HFA;VENTOLIN HFA) 108 (90 Base) MCG/ACT inhaler 2 puff  2 puff Inhalation Q6H PRN Laveda Abbe, NP      . alum & mag hydroxide-simeth (MAALOX/MYLANTA) 200-200-20 MG/5ML suspension 30 mL  30 mL Oral Q4H PRN Laveda Abbe, NP      . ARIPiprazole (ABILIFY) tablet 7.5 mg  7.5 mg Oral BID Laveda Abbe, NP   7.5 mg at 11/27/16 0853  . canagliflozin [facility]) tablet 300 mg  300 mg Oral Daily Laveda Abbe, NP      .  citalopram (CELEXA) tablet 20 mg  20 mg Oral Daily Laveda Abbe, NP   20 mg at 11/27/16 0853  . cloNIDine (CATAPRES) tablet 0.2 mg  0.2 mg Oral BID Laveda Abbe, NP   0.2 mg at 11/27/16 0853  . fluticasone (FLONASE) 50 MCG/ACT nasal spray 1 spray  1 spray Each Nare Daily Laveda Abbe, NP   1 spray at 11/27/16 [phone]  . gabapentin (NEURONTIN) capsule 1,200 mg  1,200 mg Oral TID Laveda Abbe, NP   1,200 mg at 11/27/16 0853  . hydrOXYzine (ATARAX/VISTARIL) tablet 25 mg  25 mg Oral TID PRN Laveda Abbe, NP   25 mg at 11/26/16 2126  . hydrOXYzine (ATARAX/VISTARIL) tablet 50 mg  50 mg Oral QHS,MR X 1 Laveda Abbe, NP   50 mg at 11/26/16 2125  . ibuprofen (ADVIL,MOTRIN) tablet 600 mg  600 mg Oral Q6H PRN Denzil Magnuson, NP      . insulin aspart (novoLOG) injection 0-15 Units  0-15 Units Subcutaneous TID WC Donell Sievert E, PA-C   2 Units at 11/27/16 [phone]  . insulin aspart (novoLOG) injection 0-5 Units  0-5 Units Subcutaneous QHS Simon, Spencer E, PA-C      . insulin glargine (LANTUS) injection 60 Units  60 Units Subcutaneous QHS Laveda Abbe, NP   60 Units at 11/26/16 2130  . magnesium hydroxide (MILK OF MAGNESIA) suspension 30 mL  30 mL Oral Daily PRN Laveda Abbe, NP      . nicotine (NICODERM CQ - dosed in mg/24 hours) patch 21 mg  21 mg Transdermal Daily Donell Sievert E, PA-C   21 mg at 11/27/16 0854  . pantoprazole (PROTONIX) EC tablet 40 mg  40 mg Oral Daily Laveda Abbe, NP   40 mg at 11/27/16 0853  . tiotropium (SPIRIVA) inhalation capsule 18 mcg  18 mcg Inhalation Daily Laveda Abbe, NP   18 mcg at 11/27/16 [phone]  . traZODone (DESYREL) tablet 200 mg  200 mg Oral QHS Laveda Abbe, NP   200 mg at 11/26/16 2125  . traZODone (DESYREL) tablet 50 mg  50 mg Oral QHS PRN Laveda Abbe, NP   50 mg at 11/26/16 2126   PTA Medications: Prescriptions Prior to Admission  Medication Sig Dispense Refill  Last Dose  . albuterol (PROAIR HFA) 108 (90 Base) MCG/ACT inhaler Inhale 2 puffs into the lungs every 6 (six) hours as needed for shortness of breath. 1 Inhaler 0 11/24/2016 at Unknown time  . ARIPiprazole (ABILIFY) 15 MG tablet Take 0.5 tablets (7.5 mg total) by mouth 2 (  two) times daily. For mood control (Patient not taking: Reported on 11/24/2016) 60 tablet 0 Not Taking at Unknown time  . citalopram (CELEXA) 20 MG tablet Take 1 tablet (20 mg total) by mouth daily. For depression (Patient not taking: Reported on 11/24/2016) 30 tablet 0 Not Taking at Unknown time  . gabapentin (NEURONTIN) 400 MG capsule Take 3 capsules (1,200 mg total) by mouth 3 (three) times daily. For agitation (Patient not taking: Reported on 11/24/2016) 270 capsule 0 Not Taking at Unknown time  . hydrOXYzine (ATARAX/VISTARIL) 50 MG tablet Take 1 tablet (50 mg total) by mouth at bedtime and may repeat dose one time if needed. For anxiety/insomnia 60 tablet 0 11/23/2016 at Unknown time  . insulin glargine (LANTUS) 100 UNIT/ML injection Inject 0.6 mLs (60 Units total) into the skin at bedtime. For diabetes managment 10 mL 0 Past Week at Unknown time  . INVOKANA 300 MG TABS tablet Take 1 tablet (300 mg total) by mouth daily. For diabetes management (Patient not taking: Reported on 11/24/2016) 1 tablet 0 Not Taking at Unknown time  . lisinopril (PRINIVIL,ZESTRIL) 20 MG tablet Take 1 tablet (20 mg total) by mouth daily. For high blood pressure (Patient not taking: Reported on 11/24/2016) 1 tablet 0 Not Taking at Unknown time  . nicotine (NICODERM CQ - DOSED IN MG/24 HOURS) 21 mg/24hr patch Place 1 patch (21 mg total) onto the skin daily at 6 (six) AM. For smoking cessation (Patient not taking: Reported on 11/24/2016) 28 patch 0 Not Taking at Unknown time  . omeprazole (PRILOSEC) 20 MG capsule Take 2 capsules (40 mg total) by mouth daily. For acid reflux   11/23/2016 at Unknown time  . oxymetazoline (AFRIN) 0.05 % nasal spray Place 1 spray into  both nostrils 2 (two) times daily as needed for congestion. (Patient not taking: Reported on 11/24/2016) 30 mL 0 Not Taking at Unknown time  . tiotropium (SPIRIVA) 18 MCG inhalation capsule Place 1 capsule (18 mcg total) into inhaler and inhale daily. For COPD 30 capsule 12 11/24/2016 at Unknown time  . tiZANidine (ZANAFLEX) 2 MG tablet Take 2 tablets (4 mg total) by mouth 3 (three) times daily. For pain 1 tablet 0 11/23/2016 at Unknown time  . traZODone (DESYREL) 100 MG tablet Take 2 tablets (200 mg total) by mouth at bedtime. For sleep 60 tablet 0 11/23/2016 at Unknown time  . XARELTO 20 MG TABS tablet Take 1 tablet (20 mg total) by mouth daily. For prevention of blood clot (Patient not taking: Reported on 11/24/2016) 1 tablet 0 Not Taking at Unknown time    Musculoskeletal: Strength & Muscle Tone: within normal limits Gait & Station: ambulates in wheel chair Patient leans: N/A  Psychiatric Specialty Exam: Physical Exam  Vitals reviewed. Constitutional: He is oriented to person, place, and time.  Neurological: He is alert and oriented to person, place, and time.    Review of Systems  Psychiatric/Behavioral: Positive for depression, substance abuse (Hx of substance abuse ) and suicidal ideas. Negative for hallucinations and memory loss. The patient is nervous/anxious. The patient does not have insomnia.   All other systems reviewed and are negative.   Blood pressure 117/74, pulse (!) 101, temperature 97.7 F (36.5 C), temperature source Oral, resp. rate 18, height 5\' 9"  (1.753 m), weight 189 lb (85.7 kg).Body mass index is 27.91 kg/m.  General Appearance: Fairly Groomed  Eye Contact:  Fair  Speech:  Clear and Coherent and Normal Rate  Volume:  Normal  Mood:  Anxious, Depressed  and Hopeless  Affect:  Constricted and Depressed  Thought Process:  Coherent, Linear and Descriptions of Associations: Intact  Orientation:  Full (Time, Place, and Person)  Thought Content:  Logical denies AVH,  preoccupations, or ruminations   Suicidal Thoughts:  Yes.  with intent/plan  Homicidal Thoughts:  No  Memory:  Immediate;   Fair Recent;   Fair  Judgement:  Impaired  Insight:  Fair  Psychomotor Activity:  Normal  Concentration:  Concentration: Fair and Attention Span: Fair  Recall:  [employer] of Knowledge:  Fair  Language:  Good  Akathisia:  Negative  Handed:  Right  AIMS (if indicated):     Assets:  Desire for Improvement Resilience  ADL's:  Intact  Cognition:  WNL  Sleep:  Number of Hours: 6.5    Treatment Plan Summary: Daily contact with patient to assess and evaluate symptoms and progress in treatment  Treatment Plan/Recommendations: 1. Admit for crisis management and stabilization, estimated length of stay 3-5 days.  2. Medication management to reduce current symptoms to base line and improve the patient's overall level of functioning: See Md's SRATreatment plan.? 3. Treat health problems as indicated.  4. Develop treatment plan to decrease risk of relapse upon discharge and the need for readmission.  5. Psycho-social education regarding relapse prevention and self care.  6. Health care follow up as needed for medical problems.  7. Review, reconcile, and reinstate any pertinent home medications for other health issues where appropriate. 8. Call for consults with hospitalist for any additional specialty patient care services as needed. 9. Begin Clonidine protocol for withdrawal symptoms as appropriate.   Observation Level/Precautions:  15 minute checks  Laboratory:   UDS (-). HgbA1c in process. Ordered TSH  and lipid panel.  Continue to monitor CBG's  Psychotherapy:  Group milieu   Medications:  See MAR. Will resume home medications for medical conditions.   Consultations:  As needed.  Discharge Concerns:  Mood stability, maintaining sobriety & safety  Estimated LOS:3-5 days.  Other:  Admit to the 400-hall.      Physician Treatment Plan for Primary Diagnosis: MDD  (major depressive disorder), recurrent severe, without psychosis (HCC) Long Term Goal(s): Improvement in symptoms so as ready for discharge  Short Term Goals: Ability to identify changes in lifestyle to reduce recurrence of condition will improve, Ability to verbalize feelings will improve and Ability to identify triggers associated with substance abuse/mental health issues will improve  Physician Treatment Plan for Secondary Diagnosis: Principal Problem:   MDD (major depressive disorder), recurrent severe, without psychosis (HCC)  Long Term Goal(s): Improvement in symptoms so as ready for discharge  Short Term Goals: Ability to disclose and discuss suicidal ideas and Ability to identify and develop effective coping behaviors will improve  I certify that inpatient services furnished can reasonably be expected to improve the patient's condition.    Denzil Magnuson, NP 6/28/201812:23 PM  Case reviewed with NP and patient seen by me Agree with NP assessment  56 year old male, divorced, has two adult children, lives alone, on disability. He presented to the hospital voluntarily. He reports recent suicide attempt by hanging . States " I fainted, and I fell, and I broke a couple of toes ". Reports he has been struggling with depression for several months. He reports limited social support and loneliness are contributing factors . States he has been off his psychiatric medications for about a month . Of note, admission UDS negative.  He has a history of prior  psychiatric admissions, most recently 12/17, for depression and SI. At that time was discharged on Celexa, Abilify, Neurontin, Trazodone . States he has been diagnosed with Bipolar Disorder, but states " I only get depressed, I have not been manic in many years ".  History of alcohol use disorder, but states he has not been drinking for several years . Reports occasional cocaine use, which he states is " about one a month". Medical history is  remarkable for DM, HTN, Hep C, COPD. NKDA.  Dx- Bipolar Disorder, Depressed   Plan- He has been restarted on Abilify, Celexa, Neurontin which he states were helping and were well tolerated  Will decrease dose of Celexa to 10 mgrs QDAY and of Neurontin to 300 mgrs TID, as patient had not been taking medications prior to admission. Will titrate as tolerated  Recheck TSH as was was low 12/17

## 2016-11-27 NOTE — [facility] Counselor (Signed)
Adult Comprehensive Assessment  Patient ID: Billy [name], male   DOB: September 26, 1960, 56 y.o.   MRN: [id number]  Information Source: Information source: Patient  Current Stressors:  Educational / Learning stressors: None reported  Employment / Job issues: Pt is on disability  Family Relationships: Distant relationships with family members  Surveyor, quantityinancial / Lack of resources (include bankruptcy): None reported  Housing / Lack of housing: None reported  Physical health (include injuries & life threatening diseases): Back pain and injury  Social relationships: None reported  Substance abuse: Occasional THC and cocaine use  Bereavement / Loss: Dad died in 2002 and mother died in 2009  Living/Environment/Situation:  Living Arrangements: Alone Living conditions (as described by patient or guardian): Pt lives alone in an apartment  How long has patient lived in current situation?: Since Oct 2017 What is atmosphere in current home: Comfortable  Family History:  Marital status: Single Are you sexually active?: No Does patient have children?: Yes How many children?: 2 How is patient's relationship with their children?: Good with [name] and [name] who live in [place]  proud of both  Childhood History:  By whom was/is the patient raised?: Both parents Additional childhood history information: "I could not have paid for a better childhood. My parents were together 2969 years" Description of patient's relationship with caregiver when they were a child: Mother not so good; much better with father Patient's description of current relationship with people who raised him/her: Both of pt's parents are now deceased  Does patient have siblings?: Yes Number of Siblings: 3 Description of patient's current relationship with siblings: Has contact only with oldest sister, and not much Did patient suffer any verbal/emotional/physical/sexual abuse as a child?: No Did patient suffer from severe  childhood neglect?: No Has patient ever been sexually abused/assaulted/raped as an adolescent or adult?: No Was the patient ever a victim of a crime or a disaster?: No Witnessed domestic violence?: No Has patient been effected by domestic violence as an adult?: No  Education:  Highest grade of school patient has completed: Scientist, research (physical sciences)Associates Degree in [employer]  Currently a student?: No Learning disability?: No  Employment/Work Situation:   Employment situation: On disability Why is patient on disability: Severe Bipolar and for back pain How long has patient been on disability: [id number] Patient's job has been impacted by current illness: No What is the longest time patient has a held a job?: 3 years Where was the patient employed at that time?: [employer] in [place] Has patient ever been in the [employer] and Companymilitary?: Yes (Describe in comment) (In the [employer] for 2 years ) Has patient ever served in combat?: No Did You Receive Any Psychiatric Treatment/Services While in the [employer]?: No Are There Guns or Other Weapons in Your Home?: No  Financial Resources:   Surveyor, quantityinancial resources: Writereceives SSI  Alcohol/Substance Abuse:   What has been your use of drugs/alcohol within the last 12 months?: Pt states he has not had a drink in 3 years, occasional THC and cocaine use  Alcohol/Substance Abuse Treatment Hx: Past Tx, Inpatient, Past Tx, Outpatient If yes, describe treatment: [facility] 30 years ago, [facility], [facility] 2014 and 2015. Has alcohol/substance abuse ever caused legal problems?: No  Social Support System:   Forensic psychologistatient's Community Support System: None Describe Community Support System: "I don't have one" Type of faith/religion: NA How does patient's faith help to cope with current illness?: NA  Leisure/Recreation:   Leisure and Hobbies: Reading  Strengths/Needs:  What things does the patient do well?: Communicate well, polite  In what areas does patient struggle / problems for  patient: Chronic pain, lonliness   Discharge Plan:   Does patient have access to transportation?: Yes (Public transportation ) Will patient be returning to same living situation after discharge?: Yes Currently receiving community mental health services: Yes (From Whom) ([facility]) If no, would patient like referral for services when discharged?: Yes (What county?) (Possibly interested in IOP with [facility] ) Does patient have financial barriers related to discharge medications?: No  Summary/Recommendations:     Patient is a 56 yo male who presented to the hospital with depression and SI. Pt's primary diagnosis is Bipolar Affective Disorder. Primary triggers for admission include increasing depression and lonliness. Pt states that he lives alone and denies having any supports. During the time of the assessment pt was lethargic, but pleasant. Pt is agreeable to [facility] for outpatient services. Patient will benefit from crisis stabilization, medication evaluation, group therapy and pyschoeducation, in addition to case management for discharge planning. At discharge, it is recommended that pt remain compliant with the established discharge plan and continue treatment.   Billy [name], MSW, Theresia Majors 11/27/2016

## 2016-11-27 NOTE — Social Work (Signed)
Left VM for Hendricks MiloMichelle, [employer] care manager, requesting discharge planning assistance.  Santa GeneraAnne Joslynn Jamroz, LCSW Lead Clinical Social Worker Phone:  [phone]

## 2016-11-27 NOTE — Social Work (Signed)
[facility] Belenda CruiseMichelle B. [phone] ext [phone].  Left VM requesting call back for discharge planning assistance.  Santa GeneraAnne Azoria Abbett, LCSW Lead Clinical Social Worker Phone:  [phone]

## 2016-11-27 NOTE — Progress Notes (Signed)
Patient ID: Billy [name], male   DOB: November 30, 1960, 56 y.o.   MRN: [id number]  DAR: Pt. Denies SI/HI and A/V Hallucinations. He reports sleep is good, appetite is good, energy level is low, and concentration is poor. He rates depression 8/10, hopelessness 8/10, and anxiety 10/10. Patient does report pain in his left toes, post fracture. He reports, "Tylenol does not work." An order for Ibuprofen was obtained and administered to patient. Writer spoke with pharmacy about patient's current order for Invokana. Patient is currently refusing this medication and therefore Ibuprofen was administered. Support and encouragement provided to the patient however patient is guarded. He can be irritable at times and presents with concrete thinking. Scheduled medications administered to patient except Invokana, which he refused. Patient is minimal in the milieu but did attend morning RN group. He is utilizing his cam walker and wheelchair at this time. Q15 minute checks are maintained for safety.

## 2016-11-27 NOTE — [facility] Group Notes (Signed)
[facility] Group Notes:  Self Care   Date:  11/27/2016  Time:  2:28 PM  Type of Therapy:  Psychoeducational Skills  Participation Level:  Minimal  Participation Quality:  Drowsy  Affect:  Blunted and Depressed  Cognitive:  Appropriate  Insight:  Limited  Engagement in Group:  Limited  Modes of Intervention:  Activity, Discussion and Education  Summary of Progress/Problems: Patient attended group this morning and participated in the activity and spoke minimally. He was able to report that his idea of self care is eating well and taking medications as prescribed. He appeared drowsy in group, appeared to be drooping head intermittently.   Ned ClinesDopson, Marabella Popiel E 11/27/2016, 2:28 PM
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
## 2016-11-28 ENCOUNTER — Encounter (HOSPITAL_COMMUNITY): Payer: Self-pay | Admitting: Behavioral Health

## 2016-11-28 LAB — GLUCOSE, CAPILLARY
GLUCOSE-CAPILLARY: 202 mg/dL — AB (ref 65–99)
GLUCOSE-CAPILLARY: 136 mg/dL — AB (ref 65–99)
Glucose-Capillary: 168 mg/dL — ABNORMAL HIGH (ref 65–99)
Glucose-Capillary: 184 mg/dL — ABNORMAL HIGH (ref 65–99)

## 2016-11-28 LAB — HEMOGLOBIN A1C
Hgb A1c MFr Bld: 7 % — ABNORMAL HIGH (ref 4.8–5.6)
Mean Plasma Glucose: 154 mg/dL

## 2016-11-28 LAB — TSH: TSH: 0.132 u[IU]/mL — AB (ref 0.350–4.500)

## 2016-11-28 MED ORDER — TIZANIDINE HCL 4 MG PO TABS
4.0000 mg | ORAL_TABLET | Freq: Three times a day (TID) | ORAL | Status: DC
  Administered 2016-11-28 – 2016-12-04 (×18): 4 mg via ORAL
  Filled 2016-11-28 (×23): qty 1

## 2016-11-28 NOTE — Plan of Care (Signed)
Problem: Safety: Goal: Periods of time without injury will increase Outcome: Progressing Pt has been safe on the unit this evening

## 2016-11-28 NOTE — Progress Notes (Signed)
Recreation Therapy Notes  Date: 11/28/16 Time: 0930 Location: 300 Hall Dayroom  Group Topic: Stress Management  Goal Area(s) Addresses:  Patient will verbalize importance of using healthy stress management.  Patient will identify positive emotions associated with healthy stress management.   Intervention: Stress Management  Activity :  Progressive Muscle Relaxation.  LRT introduced the stress management technique of progressive muscle relaxation.  LRT read [name] script that guided patients through steps of tensing and relaxing each muscle group.  Patients were to follow along as LRT read script to fully participate in the technique.  Education:  Stress Management, Discharge Planning.   Education Outcome: Acknowledges edcuation/In group clarification offered/Needs additional education  Clinical Observations/Feedback: Pt did not attend group.   Sierah Lacewell, LRT/CTRS         Billy [name] 11/28/2016 11:22 AM 

## 2016-11-28 NOTE — Tx Team (Signed)
Interdisciplinary Treatment and Diagnostic Plan Update 11/28/2016 Time of Session: 9:30am  Billy [name]  MRN: [id number]  Principal Diagnosis: MDD (major depressive disorder), recurrent severe, without psychosis (HCC)  Secondary Diagnoses: Principal Problem:   MDD (major depressive disorder), recurrent severe, without psychosis (HCC) Active Problems:   Suicide attempt (HCC)   Current Medications:  Current Facility-Administered Medications  Medication Dose Route Frequency Provider Last Rate Last Dose  . acetaminophen (TYLENOL) tablet 650 mg  650 mg Oral Q6H PRN Laveda Abbe, NP   650 mg at 11/27/16 0414  . albuterol (PROVENTIL HFA;VENTOLIN HFA) 108 (90 Base) MCG/ACT inhaler 2 puff  2 puff Inhalation Q6H PRN Laveda Abbe, NP      . alum & mag hydroxide-simeth (MAALOX/MYLANTA) 200-200-20 MG/5ML suspension 30 mL  30 mL Oral Q4H PRN Laveda Abbe, NP      . ARIPiprazole (ABILIFY) tablet 7.5 mg  7.5 mg Oral BID Laveda Abbe, NP   7.5 mg at 11/28/16 0815  . canagliflozin [facility]) tablet 300 mg  300 mg Oral Daily Laveda Abbe, NP   300 mg at 11/28/16 0816  . citalopram (CELEXA) tablet 10 mg  10 mg Oral Daily Cobos, Rockey Situ, MD   10 mg at 11/28/16 0815  . cloNIDine (CATAPRES) tablet 0.2 mg  0.2 mg Oral BID Laveda Abbe, NP   0.2 mg at 11/28/16 0815  . fluticasone (FLONASE) 50 MCG/ACT nasal spray 1 spray  1 spray Each Nare BID Armandina Stammer I, NP   1 spray at 11/28/16 0816  . gabapentin (NEURONTIN) capsule 300 mg  300 mg Oral TID Cobos, Rockey Situ, MD   300 mg at 11/28/16 0815  . hydrOXYzine (ATARAX/VISTARIL) tablet 25 mg  25 mg Oral TID PRN Laveda Abbe, NP   25 mg at 11/26/16 2126  . hydrOXYzine (ATARAX/VISTARIL) tablet 50 mg  50 mg Oral QHS,MR X 1 Laveda Abbe, NP   50 mg at 11/27/16 2212  . ibuprofen (ADVIL,MOTRIN) tablet 600 mg  600 mg Oral Q6H PRN Denzil Magnuson, NP   600 mg at 11/28/16 0815  . insulin aspart  (novoLOG) injection 0-15 Units  0-15 Units Subcutaneous TID WC Donell Sievert E, PA-C   5 Units at 11/28/16 [phone]  . insulin aspart (novoLOG) injection 0-5 Units  0-5 Units Subcutaneous QHS Simon, Spencer E, PA-C      . insulin glargine (LANTUS) injection 60 Units  60 Units Subcutaneous QHS Laveda Abbe, NP   60 Units at 11/27/16 2212  . magnesium hydroxide (MILK OF MAGNESIA) suspension 30 mL  30 mL Oral Daily PRN Laveda Abbe, NP      . nicotine (NICODERM CQ - dosed in mg/24 hours) patch 21 mg  21 mg Transdermal Daily Donell Sievert E, PA-C   21 mg at 11/28/16 0816  . pantoprazole (PROTONIX) EC tablet 40 mg  40 mg Oral Daily Laveda Abbe, NP   40 mg at 11/28/16 0816  . tiotropium (SPIRIVA) inhalation capsule 18 mcg  18 mcg Inhalation Daily Laveda Abbe, NP   18 mcg at 11/28/16 0815  . traZODone (DESYREL) tablet 100 mg  100 mg Oral QHS PRN Cobos, Rockey Situ, MD   100 mg at 11/27/16 2212    PTA Medications: Prescriptions Prior to Admission  Medication Sig Dispense Refill Last Dose  . albuterol (PROAIR HFA) 108 (90 Base) MCG/ACT inhaler Inhale 2 puffs into the lungs every 6 (six) hours as needed for shortness of breath.  1 Inhaler 0 11/24/2016 at Unknown time  . ARIPiprazole (ABILIFY) 15 MG tablet Take 0.5 tablets (7.5 mg total) by mouth 2 (two) times daily. For mood control (Patient not taking: Reported on 11/24/2016) 60 tablet 0 Not Taking at Unknown time  . citalopram (CELEXA) 20 MG tablet Take 1 tablet (20 mg total) by mouth daily. For depression (Patient not taking: Reported on 11/24/2016) 30 tablet 0 Not Taking at Unknown time  . gabapentin (NEURONTIN) 400 MG capsule Take 3 capsules (1,200 mg total) by mouth 3 (three) times daily. For agitation (Patient not taking: Reported on 11/24/2016) 270 capsule 0 Not Taking at Unknown time  . hydrOXYzine (ATARAX/VISTARIL) 50 MG tablet Take 1 tablet (50 mg total) by mouth at bedtime and may repeat dose one time if needed. For  anxiety/insomnia 60 tablet 0 11/23/2016 at Unknown time  . insulin glargine (LANTUS) 100 UNIT/ML injection Inject 0.6 mLs (60 Units total) into the skin at bedtime. For diabetes managment 10 mL 0 Past Week at Unknown time  . INVOKANA 300 MG TABS tablet Take 1 tablet (300 mg total) by mouth daily. For diabetes management (Patient not taking: Reported on 11/24/2016) 1 tablet 0 Not Taking at Unknown time  . lisinopril (PRINIVIL,ZESTRIL) 20 MG tablet Take 1 tablet (20 mg total) by mouth daily. For high blood pressure (Patient not taking: Reported on 11/24/2016) 1 tablet 0 Not Taking at Unknown time  . nicotine (NICODERM CQ - DOSED IN MG/24 HOURS) 21 mg/24hr patch Place 1 patch (21 mg total) onto the skin daily at 6 (six) AM. For smoking cessation (Patient not taking: Reported on 11/24/2016) 28 patch 0 Not Taking at Unknown time  . omeprazole (PRILOSEC) 20 MG capsule Take 2 capsules (40 mg total) by mouth daily. For acid reflux   11/23/2016 at Unknown time  . oxymetazoline (AFRIN) 0.05 % nasal spray Place 1 spray into both nostrils 2 (two) times daily as needed for congestion. (Patient not taking: Reported on 11/24/2016) 30 mL 0 Not Taking at Unknown time  . tiotropium (SPIRIVA) 18 MCG inhalation capsule Place 1 capsule (18 mcg total) into inhaler and inhale daily. For COPD 30 capsule 12 11/24/2016 at Unknown time  . tiZANidine (ZANAFLEX) 2 MG tablet Take 2 tablets (4 mg total) by mouth 3 (three) times daily. For pain 1 tablet 0 11/23/2016 at Unknown time  . traZODone (DESYREL) 100 MG tablet Take 2 tablets (200 mg total) by mouth at bedtime. For sleep 60 tablet 0 11/23/2016 at Unknown time  . XARELTO 20 MG TABS tablet Take 1 tablet (20 mg total) by mouth daily. For prevention of blood clot (Patient not taking: Reported on 11/24/2016) 1 tablet 0 Not Taking at Unknown time    Treatment Modalities: Medication Management, Group therapy, Case management,  1 to 1 session with clinician, Psychoeducation, Recreational  therapy.  Patient Stressors: Substance abuse Other: Being alone Patient Strengths: Average or above average intelligence Capable of independent living Motivation for treatment/growth  Physician Treatment Plan for Primary Diagnosis: MDD (major depressive disorder), recurrent severe, without psychosis ([place]) Long Term Goal(s): Improvement in symptoms so as ready for discharge Short Term Goals: Ability to identify changes in lifestyle to reduce recurrence of condition will improve Ability to verbalize feelings will improve Ability to identify triggers associated with substance abuse/mental health issues will improve Ability to disclose and discuss suicidal ideas Ability to identify and develop effective coping behaviors will improve  Medication Management: Evaluate patient's response, side effects, and tolerance of medication regimen.  Therapeutic Interventions: 1 to 1 sessions, Unit Group sessions and Medication administration.  Evaluation of Outcomes: Not Met  Physician Treatment Plan for Secondary Diagnosis: Principal Problem:   MDD (major depressive disorder), recurrent severe, without psychosis ([place]) Active Problems:   Suicide attempt ([place])  Long Term Goal(s): Improvement in symptoms so as ready for discharge  Short Term Goals: Ability to identify changes in lifestyle to reduce recurrence of condition will improve Ability to verbalize feelings will improve Ability to identify triggers associated with substance abuse/mental health issues will improve Ability to disclose and discuss suicidal ideas Ability to identify and develop effective coping behaviors will improve  Medication Management: Evaluate patient's response, side effects, and tolerance of medication regimen.  Therapeutic Interventions: 1 to 1 sessions, Unit Group sessions and Medication administration.  Evaluation of Outcomes: Not Met  RN Treatment Plan for Primary Diagnosis: MDD (major depressive disorder),  recurrent severe, without psychosis ([place]) Long Term Goal(s): Knowledge of disease and therapeutic regimen to maintain health will improve  Short Term Goals: Compliance with prescribed medications will improve  Medication Management: RN will administer medications as ordered by provider, will assess and evaluate patient's response and provide education to patient for prescribed medication. RN will report any adverse and/or side effects to prescribing provider.  Therapeutic Interventions: 1 on 1 counseling sessions, Psychoeducation, Medication administration, Evaluate responses to treatment, Monitor vital signs and CBGs as ordered, Perform/monitor CIWA, COWS, AIMS and Fall Risk screenings as ordered, Perform wound care treatments as ordered.  Evaluation of Outcomes: Not Met  LCSW Treatment Plan for Primary Diagnosis: MDD (major depressive disorder), recurrent severe, without psychosis ([place]) Long Term Goal(s): Safe transition to appropriate next level of care at discharge, Engage patient in therapeutic group addressing interpersonal concerns. Short Term Goals: Engage patient in aftercare planning with referrals and resources  Therapeutic Interventions: Assess for all discharge needs, 1 to 1 time with Social worker, Explore available resources and support systems, Assess for adequacy in community support network, Educate family and significant other(s) on suicide prevention, Complete Psychosocial Assessment, Interpersonal group therapy.  Evaluation of Outcomes: Not Met  Progress in Treatment: Attending groups: Pt is new to milieu, continuing to assess  Participating in groups: Pt is new to milieu, continuing to assess  Taking medication as prescribed: Yes, MD continues to assess for medication changes as needed Toleration medication: Yes, no side effects reported at this time Family/Significant other contact made: No, CSW will make contact if pt consents  Patient understands diagnosis:  Continuing to assess Discussing patient identified problems/goals with staff: Yes Medical problems stabilized or resolved: Yes Denies suicidal/homicidal ideation: No, pt recently admitted for SI. Issues/concerns per patient self-inventory: None Other: N/A  New problem(s) identified: None identified at this time.   New Short Term/Long Term Goal(s): None identified at this time.   Discharge Plan or Barriers: Pt will return home and follow up with [facility].  Reason for Continuation of Hospitalization:  Anxiety  Depression Medication stabilization Suicidal ideation Withdrawal symptoms  Estimated Length of Stay: 3-5 days; Estimated discharge date 12/03/16  Attendees: Patient: 11/28/2016 10:11 AM  Physician: Dr. Parke Poisson 11/28/2016 10:11 AM  Nursing: Kieth Brightly RN; Jinny Sanders, RN 11/28/2016 10:11 AM  RN Care Manager: 11/28/2016 10:11 AM  Social Worker: Matthew Saras, [place] 11/28/2016 10:11 AM  Recreational Therapist:  11/28/2016 10:11 AM  Other: Lindell Spar, NP 11/28/2016 10:11 AM  Other:  11/28/2016 10:11 AM  Other: 11/28/2016 10:11 AM  Scribe for Treatment Team: Georga Kaufmann, MSW,LCSWA 11/28/2016 10:11 AM

## 2016-11-28 NOTE — Progress Notes (Signed)
Patient attended group and said that his day was a 6.  His coping skills for today were reading, praying and watching tv,

## 2016-11-28 NOTE — [facility] Group Notes (Signed)
[facility] LCSW Group Therapy 11/28/2016 1:15pm  Type of Therapy: Group Therapy- Feelings Around Relapse and Recovery  Participation Level: Active   Participation Quality:  Appropriate  Affect:  Appropriate  Cognitive: Alert and Oriented   Insight:  Developing   Engagement in Therapy: Developing/Improving and Engaged   Modes of Intervention: Clarification, Confrontation, Discussion, Education, Exploration, Limit-setting, Orientation, Problem-solving, Rapport Building, Dance movement psychotherapisteality Testing, Socialization and Support  Summary of Progress/Problems: The topic for today was feelings about relapse. The group discussed what relapse prevention is to them and identified triggers that they are on the path to relapse. Members also processed their feeling towards relapse and were able to relate to common experiences. Group also discussed coping skills that can be used for relapse prevention. Pt states that his biggest trigger for relapse will be not taking his medications. Pt reports that he knows how helpful his medications are but he finds it difficult to remain on. "It's pathetic. I know that I need to take my meds and I have them all layed out. I'm just so lazy".   Therapeutic Modalities:   Cognitive Behavioral Therapy Solution-Focused Therapy Assertiveness Training Relapse Prevention Therapy    Donnelly StagerLynn Latonja Bobeck, MSW, Theresia MajorsLCSWA [phone] 11/28/2016 2:29 PM

## 2016-11-28 NOTE — Progress Notes (Addendum)
D: Spoke with patient 1:1 who was found resting in bed. Patient disheveled, sleepy. Forwards minimal information though states he is frustrated at his neurontin dosing. "I was taking 600mg  three times a day. Now it's just 300mg  three times." Patient's affect depressed, flat with congruent mood. Rating depression at an 8/10, hopelessness at an 8/10 and anxiety at a 6/10. Rates sleep as fair, appetite as good, energy as normal and concentration as poor.  States goal for today is to "take my meds." Complaining of pain in his 3 broken toes on his R foot. Rates the pain at an 8/10. "It's funny how that happened with my toes. I was trying to hang myself but passed out." No other physical complaints.   A: Medicated per orders, advil prn given for discomfort. Level III obs in place for safety. Emotional support offered and self inventory reviewed. Strongly encouraged completion of Suicide Safety Plan and programming participation. Discussed POC with MD, SW.  Fall prevention plan in place and reviewed with patient as he is a high fall risk.   R: Patient verbalizes understanding of POC, falls prevention education however remains isolative to room, bed. On reassess of pain, patient is asleep. Patient denies SI/HI/AVH and remains safe on level III obs. Will continue to monitor closely, round frequently.

## 2016-11-28 NOTE — Progress Notes (Signed)
D: Pt denies SI/HI/AVH. Pt is pleasant and cooperative. Pt has remained in his room most of the evening, pt came out for snacks/ drink. Pt stated he was doing better since coming in here due to people seeming like they care more to him.   A: Pt was offered support and encouragement. Pt was given scheduled medications. Pt was encourage to attend groups. Q 15 minute checks were done for safety.   R:Pt attends groups and interacts well with peers and staff. Pt is taking medication. Pt has no complaints at this time .Pt receptive to treatment and safety maintained on unit.

## 2016-11-28 NOTE — Plan of Care (Signed)
Problem: Education: Goal: Verbalization of understanding the information provided will improve Outcome: Progressing Patient verbalizes understanding of information, education provided.  Problem: Self-Concept: Goal: Ability to disclose and discuss suicidal ideas will improve Outcome: Progressing Patient presented with passive SI less than 48 hours ago. Currently denying. Will continue to assess.

## 2016-11-28 NOTE — Progress Notes (Signed)
[facility]  11/28/2016 3:30 PM Billy [name]  MRN:  [id number]  Subjective:  " I doing fine. I am adjusting without any problems. I was on a medication for my back before I came and wanted to know why it wasn't re-started?"  Objective: Face to face evaluation completed and chart reviewed. Billy [name] is a  56 year old male admitted to [facility] after he tried to hang himself in an intentional SA. During this evaluation he is alert and oriented x4, calm, and cooperative. He denies SI with plan or intent, homicidal ideas, or AVH. He does not appear to be internally preoccupied. He denies paranoid ideations. Continues to endorse a significant level of both depression and anxiety rating both 8/10 with 0 being none and 10 the worst. He endorses back spasms and reports using medication to manage spasms prior to his admission. Denies other somatic complaints or acute pain. He endorses fair appetite yet endorses sleeping pattern as poor. He appears to be tolerating medications well and denies side effects. He endorses of history of cocaine and THC use yet denies current use. There are no withdrawl symptoms reported or observed. His UDS was (-) He continues to ambulate with wheel chair due to injury to 3 toes. At this time, he is able to contract and maintain safety on the unit.     Principal Problem: MDD (major depressive disorder), recurrent severe, without psychosis (HCC) Diagnosis:   Patient Active Problem List   Diagnosis Date Noted  . MDD (major depressive disorder), recurrent severe, without psychosis (HCC) [F33.2] 11/26/2016  . Suicide attempt (HCC) [T14.91XA] 11/25/2016  . Suicidal ideation [R45.851]   . Bipolar disorder, curr episode mixed, severe, w/o psychotic features (HCC) [F31.63] 05/17/2016  . Cocaine use disorder, severe, dependence (HCC) [F14.20] 05/17/2016  . Affective psychosis, bipolar (HCC) [F31.9]   . Bipolar affective disorder, depressed (HCC) [F31.30] 08/09/2015  . Bipolar  disorder with current episode depressed (HCC) [F31.30] 08/09/2015  . Polysubstance abuse [F19.10]   . Type 2 diabetes mellitus with hyperglycemia (HCC) [E11.65] 09/19/2014  . Right leg DVT (HCC) [I82.401] 09/19/2014  . Essential hypertension [I10] 09/19/2014  . Bipolar affective disorder, depressed, severe, with psychotic behavior (HCC) [F31.5] 09/14/2014  . Chronic pain syndrome [G89.4] 09/14/2014  . Cocaine abuse [F14.10] 09/13/2014  . Alcohol use disorder, mild, abuse [F10.10] 10/12/2012  . Homeless [Z59.0] 09/02/2011  . Opiate abuse, episodic [F11.10] 09/02/2011  . Diabetes mellitus [250] 08/30/2011  . WPW (Wolff-Parkinson-White syndrome) [I45.6] 08/30/2011   Total Time spent with patient: 30 minutes  Past Psychiatric History: Bipolar disorder, MDD, anxiety, multiple SA, multiple psychiatric admissions.   Past Medical History:  Past Medical History:  Diagnosis Date  . Bipolar 1 disorder (HCC)   . Chronic pain   . Depression   . Diabetes mellitus   . Diabetes mellitus without complication (HCC)   . HCV (hepatitis C virus)   . Hepatitis C carrier (HCC)   . Homeless   . Hypertension   . Suicide attempt (HCC)   . WPW (Wolff-Parkinson-White syndrome)     Past Surgical History:  Procedure Laterality Date  . BACK SURGERY    . CHEST SURGERY    . RHINOPLASTY    . TONSILLECTOMY     Family History:  Family History  Problem Relation Age of Onset  . Mental illness Neg Hx    Family Psychiatric  History: See HPI  Social History:  History  Alcohol Use  . Yes    Comment:  last drink 24 days     History  Drug Use  . Types: Marijuana, "Crack" cocaine, Cocaine    Comment: last used 09/03/2014    Social History   Social History  . Marital status: Divorced    Spouse name: N/A  . Number of children: N/A  . Years of education: N/A   Social History Main Topics  . Smoking status: Current Every Day Smoker    Packs/day: 1.00    Years: 8.00    Types: Cigarettes  . Smokeless  tobacco: Never Used  . Alcohol use Yes     Comment: last drink 24 days  . Drug use: Yes    Types: Marijuana, "Crack" cocaine, Cocaine     Comment: last used 09/03/2014  . Sexual activity: Not Asked   Other Topics Concern  . None   Social History Narrative   ** Merged History Encounter **       Additional Social History:      Sleep: Poor  Appetite:  Fair  Current Medications: Current Facility-Administered Medications  Medication Dose Route Frequency Provider Last Rate Last Dose  . acetaminophen (TYLENOL) tablet 650 mg  650 mg Oral Q6H PRN Laveda Abbe, NP   650 mg at 11/27/16 1610  . albuterol (PROVENTIL HFA;VENTOLIN HFA) 108 (90 Base) MCG/ACT inhaler 2 puff  2 puff Inhalation Q6H PRN Laveda Abbe, NP      . alum & mag hydroxide-simeth (MAALOX/MYLANTA) 200-200-20 MG/5ML suspension 30 mL  30 mL Oral Q4H PRN Laveda Abbe, NP      . ARIPiprazole (ABILIFY) tablet 7.5 mg  7.5 mg Oral BID Laveda Abbe, NP   7.5 mg at 11/28/16 0815  . canagliflozin [facility]) tablet 300 mg  300 mg Oral Daily Laveda Abbe, NP   300 mg at 11/28/16 0816  . citalopram (CELEXA) tablet 10 mg  10 mg Oral Daily Ledford Goodson, Rockey Situ, MD   10 mg at 11/28/16 0815  . cloNIDine (CATAPRES) tablet 0.2 mg  0.2 mg Oral BID Laveda Abbe, NP   0.2 mg at 11/28/16 0815  . fluticasone (FLONASE) 50 MCG/ACT nasal spray 1 spray  1 spray Each Nare BID Armandina Stammer I, NP   1 spray at 11/28/16 0816  . gabapentin (NEURONTIN) capsule 300 mg  300 mg Oral TID Sammye Staff, Rockey Situ, MD   300 mg at 11/28/16 1210  . hydrOXYzine (ATARAX/VISTARIL) tablet 25 mg  25 mg Oral TID PRN Laveda Abbe, NP   25 mg at 11/26/16 2126  . hydrOXYzine (ATARAX/VISTARIL) tablet 50 mg  50 mg Oral QHS,MR X 1 Laveda Abbe, NP   50 mg at 11/27/16 2212  . ibuprofen (ADVIL,MOTRIN) tablet 600 mg  600 mg Oral Q6H PRN Denzil Magnuson, NP   600 mg at 11/28/16 0815  . insulin aspart (novoLOG) injection  0-15 Units  0-15 Units Subcutaneous TID WC Donell Sievert E, PA-C   3 Units at 11/28/16 1211  . insulin aspart (novoLOG) injection 0-5 Units  0-5 Units Subcutaneous QHS Simon, Spencer E, PA-C      . insulin glargine (LANTUS) injection 60 Units  60 Units Subcutaneous QHS Laveda Abbe, NP   60 Units at 11/27/16 2212  . magnesium hydroxide (MILK OF MAGNESIA) suspension 30 mL  30 mL Oral Daily PRN Laveda Abbe, NP      . nicotine (NICODERM CQ - dosed in mg/24 hours) patch 21 mg  21 mg Transdermal Daily Kerry Hough, PA-C  21 mg at 11/28/16 0816  . pantoprazole (PROTONIX) EC tablet 40 mg  40 mg Oral Daily Laveda Abbe, NP   40 mg at 11/28/16 0816  . tiotropium (SPIRIVA) inhalation capsule 18 mcg  18 mcg Inhalation Daily Laveda Abbe, NP   18 mcg at 11/28/16 0815  . tiZANidine (ZANAFLEX) tablet 4 mg  4 mg Oral TID Denzil Magnuson, NP      . traZODone (DESYREL) tablet 100 mg  100 mg Oral QHS PRN Layson Bertsch, Rockey Situ, MD   100 mg at 11/27/16 2212    Lab Results:  Results for orders placed or performed during the hospital encounter of 11/26/16 (from the past 48 hour(s))  Glucose, capillary     Status: Abnormal   Collection Time: 11/26/16  9:13 PM  Result Value Ref Range   Glucose-Capillary 151 (H) 65 - 99 mg/dL  Glucose, capillary     Status: Abnormal   Collection Time: 11/27/16  5:56 AM  Result Value Ref Range   Glucose-Capillary 145 (H) 65 - 99 mg/dL  Hemoglobin Z6X     Status: Abnormal   Collection Time: 11/27/16  6:18 AM  Result Value Ref Range   Hgb A1c MFr Bld 7.0 (H) 4.8 - 5.6 %    Comment: (NOTE)         Pre-diabetes: 5.7 - 6.4         Diabetes: >6.4         Glycemic control for adults with diabetes: <7.0    Mean Plasma Glucose 154 mg/dL    Comment: (NOTE) Performed At: [facility] [address] [place], [place] [id number] Mila Homer MD [id number] Performed at [facility], [address] [place], [place] [address]   Glucose, capillary     Status: Abnormal   Collection Time: 11/27/16 12:22 PM  Result Value Ref Range   Glucose-Capillary 147 (H) 65 - 99 mg/dL  Glucose, capillary     Status: Abnormal   Collection Time: 11/27/16  5:36 PM  Result Value Ref Range   Glucose-Capillary 183 (H) 65 - 99 mg/dL  Glucose, capillary     Status: Abnormal   Collection Time: 11/27/16  8:03 PM  Result Value Ref Range   Glucose-Capillary 182 (H) 65 - 99 mg/dL  Glucose, capillary     Status: Abnormal   Collection Time: 11/28/16  5:43 AM  Result Value Ref Range   Glucose-Capillary 202 (H) 65 - 99 mg/dL  TSH     Status: Abnormal   Collection Time: 11/28/16  6:00 AM  Result Value Ref Range   TSH 0.132 (L) 0.350 - 4.500 uIU/mL    Comment: Performed by a 3rd Generation assay with a functional sensitivity of <=0.01 uIU/mL. Performed at [facility], [address], [place], [place] [address]   Glucose, capillary     Status: Abnormal   Collection Time: 11/28/16 12:03 PM  Result Value Ref Range   Glucose-Capillary 168 (H) 65 - 99 mg/dL    Blood Alcohol level:  Lab Results  Component Value Date   ETH 71 (H) 05/15/2016   ETH <5 08/08/2015    Metabolic Disorder Labs: Lab Results  Component Value Date   HGBA1C 7.0 (H) 11/27/2016   MPG 154 11/27/2016   MPG 203 05/17/2016   Lab Results  Component Value Date   PROLACTIN 10.8 05/19/2016   Lab Results  Component Value Date   CHOL 202 (H) 05/17/2016   TRIG 211 (H) 05/17/2016  HDL 38 (L) 05/17/2016   CHOLHDL 5.3 05/17/2016   VLDL 42 (H) 05/17/2016   LDLCALC 122 (H) 05/17/2016    Physical Findings: AIMS: Facial and Oral Movements Muscles of Facial Expression: None, normal Lips and Perioral Area: None, normal Jaw: None, normal Tongue: None, normal,Extremity Movements Upper (arms, wrists, hands, fingers): None, normal Lower (legs, knees, ankles, toes): None, normal, Trunk Movements Neck, shoulders, hips:  None, normal, Overall Severity Severity of abnormal movements (highest score from questions above): None, normal Incapacitation due to abnormal movements: None, normal Patient's awareness of abnormal movements (rate only patient's report): No Awareness, Dental Status Current problems with teeth and/or dentures?: No Does patient usually wear dentures?: No  CIWA:    COWS:     Musculoskeletal: Strength & Muscle Tone: within normal limits Gait & Station: normal Patient leans: N/A  Psychiatric Specialty Exam: Physical Exam  Nursing note and vitals reviewed. Constitutional: He is oriented to person, place, and time.  Neurological: He is alert and oriented to person, place, and time.    Review of Systems  Musculoskeletal: Positive for back pain.  Psychiatric/Behavioral: Positive for depression. Negative for hallucinations, memory loss, substance abuse and suicidal ideas. The patient is nervous/anxious and has insomnia.   All other systems reviewed and are negative.   Blood pressure 107/77, pulse 71, temperature 99.3 F (37.4 C), temperature source Oral, resp. rate 18, height 5\' 9"  (1.753 m), weight 189 lb (85.7 kg).Body mass index is 27.91 kg/m.  General Appearance: Fairly Groomed  Eye Contact:  Good  Speech:  Slow  Volume:  Decreased  Mood:  Anxious and Depressed  Affect:  Constricted and Depressed  Thought Process:  Coherent, Linear and Descriptions of Associations: Intact  Orientation:  Full (Time, Place, and Person)  Thought Content:  Logical denies AVH, preoccupations, or ruminations   Suicidal Thoughts:  No  Homicidal Thoughts:  No  Memory:  Immediate;   Fair Recent;   Fair  Judgement:  Impaired  Insight:  Fair  Psychomotor Activity:  Decreased  Concentration:  Concentration: Fair and Attention Span: Fair  Recall:  [employer] of Knowledge:  Fair  Language:  Good  Akathisia:  Negative  Handed:  Right  AIMS (if indicated):     Assets:  Communication Skills Desire for  Improvement Resilience  ADL's:  Intact  Cognition:  WNL  Sleep:  Number of Hours: 6.5     Treatment Plan Summary: Daily contact with patient to assess and evaluate symptoms and progress in treatment   Medication management: Psychiatric conditions are unstable at this time. To reduce current symptoms to base line and improve the patient's overall level of functioning will continue the following treatment plan without adjustments;   MDD recurrent severe without psychosis- Continue Celexa to 10 mg po daily.  Agitation-Continue  Neurontin to 300 mg po TID.   Mood Control- Will continue Abilify 7.5 mg po bid for mood control continue   Anxiety-Continue vistaril 50 mg po daily at bedtime may repeat dose for anxiety  Insomnia- Continue vistaril 50 mg po daily at bedtime may repeat dose for anxiety  Medical conditions: DM, HTN, HEP C COPD. Will resume home medications as prescribed and noted in [facility].  Back spasms and pain- Restart Xanaflex 4 mg po TID for muscle spasticity.    Other:  Safety: Will discontinue 1:1 and re-initiate 15 minute observation for safety checks. Patient is able to contract for safety on the unit at this time  Labs: Repeat TSH 0.132. Ordered Free  T4  Free and T3. HgbA1c 7.0  Continue to develop treatment plan to decrease risk of relapse upon discharge and to reduce the need for readmission.  Psycho-social education regarding relapse prevention and self care.  Health care follow up as needed for medical problems. HgbA1c 7.0, TSH 0.132.  Continue to attend and participate in therapy.     Denzil Magnuson, NP 11/28/2016, 3:30 PM   Agree with NP Progress Note

## 2016-11-29 DIAGNOSIS — F17210 Nicotine dependence, cigarettes, uncomplicated: Secondary | ICD-10-CM

## 2016-11-29 DIAGNOSIS — G4700 Insomnia, unspecified: Secondary | ICD-10-CM

## 2016-11-29 DIAGNOSIS — F1290 Cannabis use, unspecified, uncomplicated: Secondary | ICD-10-CM

## 2016-11-29 DIAGNOSIS — X838XXA Intentional self-harm by other specified means, initial encounter: Secondary | ICD-10-CM

## 2016-11-29 DIAGNOSIS — F1490 Cocaine use, unspecified, uncomplicated: Secondary | ICD-10-CM

## 2016-11-29 DIAGNOSIS — F332 Major depressive disorder, recurrent severe without psychotic features: Principal | ICD-10-CM

## 2016-11-29 DIAGNOSIS — T1491XA Suicide attempt, initial encounter: Secondary | ICD-10-CM

## 2016-11-29 LAB — GLUCOSE, CAPILLARY
GLUCOSE-CAPILLARY: 149 mg/dL — AB (ref 65–99)
GLUCOSE-CAPILLARY: 140 mg/dL — AB (ref 65–99)
GLUCOSE-CAPILLARY: 178 mg/dL — AB (ref 65–99)
Glucose-Capillary: 206 mg/dL — ABNORMAL HIGH (ref 65–99)

## 2016-11-29 MED ORDER — HYDROXYZINE HCL 50 MG PO TABS
50.0000 mg | ORAL_TABLET | Freq: Three times a day (TID) | ORAL | Status: DC | PRN
  Administered 2016-11-29 – 2016-12-03 (×11): 50 mg via ORAL
  Filled 2016-11-29 (×12): qty 1

## 2016-11-29 NOTE — Progress Notes (Signed)
[facility] MD Progress Note  11/29/2016 2:13 PM Billy [name]  MRN:  [id number]  Subjective:  Billy [name] reports " I am having high anxiety today."  Objective: [name]  seen resting in wheelchair is awake, alert and oriented. Patient reports increased anxiety and is requesting medication increase for his anxiety. Reports he was to tired to attending group sessions on this morning and will try to attend groups this afternoon. Denies suicidal or homicidal ideation during this assessment. Denies auditory or visual hallucination and does not appear to be responding to internal stimuli.  Patient reports he is medication compliant without mediation side effects. Reports good appetite and reports he is resting well.  Support, encouragement and reassurance was provided.      Principal Problem: MDD (major depressive disorder), recurrent severe, without psychosis (HCC) Diagnosis:   Patient Active Problem List   Diagnosis Date Noted  . MDD (major depressive disorder), recurrent severe, without psychosis (HCC) [F33.2] 11/26/2016  . Suicide attempt (HCC) [T14.91XA] 11/25/2016  . Suicidal ideation [R45.851]   . Bipolar disorder, curr episode mixed, severe, w/o psychotic features (HCC) [F31.63] 05/17/2016  . Cocaine use disorder, severe, dependence (HCC) [F14.20] 05/17/2016  . Affective psychosis, bipolar (HCC) [F31.9]   . Bipolar affective disorder, depressed (HCC) [F31.30] 08/09/2015  . Bipolar disorder with current episode depressed (HCC) [F31.30] 08/09/2015  . Polysubstance abuse [F19.10]   . Type 2 diabetes mellitus with hyperglycemia (HCC) [E11.65] 09/19/2014  . Right leg DVT (HCC) [I82.401] 09/19/2014  . Essential hypertension [I10] 09/19/2014  . Bipolar affective disorder, depressed, severe, with psychotic behavior (HCC) [F31.5] 09/14/2014  . Chronic pain syndrome [G89.4] 09/14/2014  . Cocaine abuse [F14.10] 09/13/2014  . Alcohol use disorder, mild, abuse [F10.10] 10/12/2012  . Homeless [Z59.0]  09/02/2011  . Opiate abuse, episodic [F11.10] 09/02/2011  . Diabetes mellitus [250] 08/30/2011  . WPW (Wolff-Parkinson-White syndrome) [I45.6] 08/30/2011   Total Time spent with patient: 30 minutes  Past Psychiatric History: Bipolar disorder, MDD, anxiety, multiple SA, multiple psychiatric admissions.   Past Medical History:  Past Medical History:  Diagnosis Date  . Bipolar 1 disorder (HCC)   . Chronic pain   . Depression   . Diabetes mellitus   . Diabetes mellitus without complication (HCC)   . HCV (hepatitis C virus)   . Hepatitis C carrier (HCC)   . Homeless   . Hypertension   . Suicide attempt (HCC)   . WPW (Wolff-Parkinson-White syndrome)     Past Surgical History:  Procedure Laterality Date  . BACK SURGERY    . CHEST SURGERY    . RHINOPLASTY    . TONSILLECTOMY     Family History:  Family History  Problem Relation Age of Onset  . Mental illness Neg Hx    Family Psychiatric  History: See HPI  Social History:  History  Alcohol Use  . Yes    Comment: last drink 24 days     History  Drug Use  . Types: Marijuana, "Crack" cocaine, Cocaine    Comment: last used 09/03/2014    Social History   Social History  . Marital status: Divorced    Spouse name: N/A  . Number of children: N/A  . Years of education: N/A   Social History Main Topics  . Smoking status: Current Every Day Smoker    Packs/day: 1.00    Years: 8.00    Types: Cigarettes  . Smokeless tobacco: Never Used  . Alcohol use Yes     Comment: last drink 24  days  . Drug use: Yes    Types: Marijuana, "Crack" cocaine, Cocaine     Comment: last used 09/03/2014  . Sexual activity: Not Asked   Other Topics Concern  . None   Social History Narrative   ** Merged History Encounter **       Additional Social History:      Sleep: Poor  Appetite:  Fair  Current Medications: Current Facility-Administered Medications  Medication Dose Route Frequency Provider Last Rate Last Dose  . acetaminophen  (TYLENOL) tablet 650 mg  650 mg Oral Q6H PRN Laveda Abbe, NP   650 mg at 11/27/16 1610  . albuterol (PROVENTIL HFA;VENTOLIN HFA) 108 (90 Base) MCG/ACT inhaler 2 puff  2 puff Inhalation Q6H PRN Laveda Abbe, NP      . alum & mag hydroxide-simeth (MAALOX/MYLANTA) 200-200-20 MG/5ML suspension 30 mL  30 mL Oral Q4H PRN Laveda Abbe, NP      . ARIPiprazole (ABILIFY) tablet 7.5 mg  7.5 mg Oral BID Laveda Abbe, NP   7.5 mg at 11/29/16 0941  . canagliflozin (INVOKANA) tablet 300 mg  300 mg Oral Daily Laveda Abbe, NP   300 mg at 11/29/16 0941  . citalopram (CELEXA) tablet 10 mg  10 mg Oral Daily Cobos, Rockey Situ, MD   10 mg at 11/29/16 0940  . cloNIDine (CATAPRES) tablet 0.2 mg  0.2 mg Oral BID Laveda Abbe, NP   0.2 mg at 11/29/16 0940  . fluticasone (FLONASE) 50 MCG/ACT nasal spray 1 spray  1 spray Each Nare BID Armandina Stammer I, NP   1 spray at 11/29/16 0940  . gabapentin (NEURONTIN) capsule 300 mg  300 mg Oral TID Cobos, Rockey Situ, MD   300 mg at 11/29/16 1257  . hydrOXYzine (ATARAX/VISTARIL) tablet 50 mg  50 mg Oral TID PRN Oneta Rack, NP   50 mg at 11/29/16 1257  . ibuprofen (ADVIL,MOTRIN) tablet 600 mg  600 mg Oral Q6H PRN Denzil Magnuson, NP   600 mg at 11/29/16 0616  . insulin aspart (novoLOG) injection 0-15 Units  0-15 Units Subcutaneous TID WC Donell Sievert E, PA-C   2 Units at 11/29/16 1214  . insulin aspart (novoLOG) injection 0-5 Units  0-5 Units Subcutaneous QHS Simon, Spencer E, PA-C      . insulin glargine (LANTUS) injection 60 Units  60 Units Subcutaneous QHS Laveda Abbe, NP   60 Units at 11/28/16 2234  . magnesium hydroxide (MILK OF MAGNESIA) suspension 30 mL  30 mL Oral Daily PRN Laveda Abbe, NP      . nicotine (NICODERM CQ - dosed in mg/24 hours) patch 21 mg  21 mg Transdermal Daily Donell Sievert E, PA-C   21 mg at 11/29/16 9604  . pantoprazole (PROTONIX) EC tablet 40 mg  40 mg Oral Daily Laveda Abbe, NP   40 mg at 11/29/16 0940  . tiotropium (SPIRIVA) inhalation capsule 18 mcg  18 mcg Inhalation Daily Laveda Abbe, NP   18 mcg at 11/29/16 0940  . tiZANidine (ZANAFLEX) tablet 4 mg  4 mg Oral TID Denzil Magnuson, NP   4 mg at 11/29/16 1212  . traZODone (DESYREL) tablet 100 mg  100 mg Oral QHS PRN Cobos, Rockey Situ, MD   100 mg at 11/28/16 2235    Lab Results:  Results for orders placed or performed during the hospital encounter of 11/26/16 (from the past 48 hour(s))  Glucose, capillary     Status:  Abnormal   Collection Time: 11/27/16  5:36 PM  Result Value Ref Range   Glucose-Capillary 183 (H) 65 - 99 mg/dL  Glucose, capillary     Status: Abnormal   Collection Time: 11/27/16  8:03 PM  Result Value Ref Range   Glucose-Capillary 182 (H) 65 - 99 mg/dL  Glucose, capillary     Status: Abnormal   Collection Time: 11/28/16  5:43 AM  Result Value Ref Range   Glucose-Capillary 202 (H) 65 - 99 mg/dL  TSH     Status: Abnormal   Collection Time: 11/28/16  6:00 AM  Result Value Ref Range   TSH 0.132 (L) 0.350 - 4.500 uIU/mL    Comment: Performed by a 3rd Generation assay with a functional sensitivity of <=0.01 uIU/mL. Performed at [facility], [address], [place], [place] [address]   Glucose, capillary     Status: Abnormal   Collection Time: 11/28/16 12:03 PM  Result Value Ref Range   Glucose-Capillary 168 (H) 65 - 99 mg/dL  Glucose, capillary     Status: Abnormal   Collection Time: 11/28/16  4:55 PM  Result Value Ref Range   Glucose-Capillary 184 (H) 65 - 99 mg/dL  Glucose, capillary     Status: Abnormal   Collection Time: 11/28/16  9:13 PM  Result Value Ref Range   Glucose-Capillary 136 (H) 65 - 99 mg/dL   Comment 1 Notify RN   Glucose, capillary     Status: Abnormal   Collection Time: 11/29/16  5:49 AM  Result Value Ref Range   Glucose-Capillary 149 (H) 65 - 99 mg/dL   Comment 1 Notify RN   Glucose, capillary     Status: Abnormal    Collection Time: 11/29/16 11:53 AM  Result Value Ref Range   Glucose-Capillary 140 (H) 65 - 99 mg/dL    Blood Alcohol level:  Lab Results  Component Value Date   ETH 71 (H) 05/15/2016   ETH <5 08/08/2015    Metabolic Disorder Labs: Lab Results  Component Value Date   HGBA1C 7.0 (H) 11/27/2016   MPG 154 11/27/2016   MPG 203 05/17/2016   Lab Results  Component Value Date   PROLACTIN 10.8 05/19/2016   Lab Results  Component Value Date   CHOL 202 (H) 05/17/2016   TRIG 211 (H) 05/17/2016   HDL 38 (L) 05/17/2016   CHOLHDL 5.3 05/17/2016   VLDL 42 (H) 05/17/2016   LDLCALC 122 (H) 05/17/2016    Physical Findings: AIMS: Facial and Oral Movements Muscles of Facial Expression: None, normal Lips and Perioral Area: None, normal Jaw: None, normal Tongue: None, normal,Extremity Movements Upper (arms, wrists, hands, fingers): None, normal Lower (legs, knees, ankles, toes): None, normal, Trunk Movements Neck, shoulders, hips: None, normal, Overall Severity Severity of abnormal movements (highest score from questions above): None, normal Incapacitation due to abnormal movements: None, normal Patient's awareness of abnormal movements (rate only patient's report): No Awareness, Dental Status Current problems with teeth and/or dentures?: No Does patient usually wear dentures?: No  CIWA:    COWS:     Musculoskeletal: Strength & Muscle Tone: within normal limits Gait & Station: normal Patient leans: N/A  Psychiatric Specialty Exam: Physical Exam  Nursing note and vitals reviewed. Constitutional: He is oriented to person, place, and time. He appears well-developed.  Cardiovascular: Normal rate.   Neurological: He is alert and oriented to person, place, and time.  Patient reports broken/frx toes  Psychiatric: He has a normal mood and affect. His behavior  is normal.    Review of Systems  Musculoskeletal: Positive for back pain.  Psychiatric/Behavioral: Positive for depression.  Negative for hallucinations, memory loss, substance abuse and suicidal ideas. The patient is nervous/anxious and has insomnia.   All other systems reviewed and are negative.   Blood pressure 129/79, pulse 68, temperature 98.7 F (37.1 C), temperature source Oral, resp. rate 16, height 5\' 9"  (1.753 m), weight 85.7 kg (189 lb).Body mass index is 27.91 kg/m.  General Appearance: Fairly Groomed and Guarded  Eye Contact:  Good  Speech:  Slow  Volume:  Decreased  Mood:  Anxious and Depressed  Affect:  Constricted and Depressed  Thought Process:  Coherent  Orientation:  Full (Time, Place, and Person)  Thought Content:  Logical no Preoccupations or ruminations   Suicidal Thoughts:  No  Homicidal Thoughts:  No  Memory:  Immediate;   Fair Recent;   Fair  Judgement:  Impaired  Insight:  Fair  Psychomotor Activity:  Decreased  Concentration:  Concentration: Fair and Attention Span: Fair  Recall:  [employer]Fair  Fund of Knowledge:  Fair  Language:  Good  Akathisia:  Negative  Handed:  Right  AIMS (if indicated):     Assets:  Communication Skills Desire for Improvement Resilience  ADL's:  Intact  Cognition:  WNL  Sleep:  Number of Hours: 6.25     I agree with current treatment plan on 11/29/2016, Patient seen face-to-face for psychiatric evaluation follow-up, chart reviewed. Reviewed the information documented and agree with the treatment plan.  Treatment Plan Summary: Daily contact with patient to assess and evaluate symptoms and progress in treatment   Continue with Celexa 10 mg, Neurontin 300 mg and Abilify 7.5mg   for mood stabilization. Continue with Trazodone 100 mg for insomnia  Will continue to monitor vitals ,medication compliance and treatment side effects while patient is here.  Reviewed labs BAL - , UDS - TSH 0.132 will add T3/T4 CSW will start working on disposition.  Patient to participate in therapeutic milieu    Oneta Rackanika N Lewis, NP 11/29/2016, 2:13 PM Notes reviewed and  agree with plan

## 2016-11-29 NOTE — Progress Notes (Signed)
D: Pt denies SI/HI/AVH. Pt is pleasant and cooperative. Pt has been out of his room, but has isolated himself on the unit at in his room. Pt stated he was doing better, but not ready to leave at this time  A: Pt was offered support and encouragement. Pt was given scheduled medications. Pt was encourage to attend groups. Q 15 minute checks were done for safety.   R:Pt attends groups and interacts well with peers and staff. Pt is taking medication. Pt has no complaints.Pt receptive to treatment and safety maintained on unit.

## 2016-11-29 NOTE — Progress Notes (Signed)
Writer has observed patient lying in bed resting or asleep off and on this evening. He attended group at the end and got his snacks and returned to his room. He has not been interacting with peers or staff. He received his medications and remains in bed resting. Support givne and safety maintained on unit with 15 min checks.

## 2016-11-29 NOTE — [facility] Group Notes (Signed)
[facility] Group Notes:  (Nursing/MHT/Case Management/Adjunct)  Date:  11/29/2016  Time:  5:51 PM  Type of Therapy:  Nurse Education  Participation Level:  Did Not Attend  Summary of Progress/Problems:   Group addressed coping skills related to depression and anxiety, specifically dealing with stressful situations.    Almira Barenny G Roneka Gilpin 11/29/2016, 5:51 PM

## 2016-11-29 NOTE — Progress Notes (Signed)
Patient ID: Billy [name], male   DOB: [birth year], 56 y.o.   MRN: [id number]   D: Pt has been very irritable on the unit today. He did not attend any groups nor did he engage in treatment. Pt made reports that he was very anxious, Vistaril was given. Pt reported that his depression was a 8, his hopelessness was a 8, and his anxiety was a 8. Pt reported that his goal for today was to take his medication. Pt reported being negative SI/HI, no AH/VH noted. A: 15 min checks continued for patient safety. R: Pt safety maintained.

## 2016-11-29 NOTE — Plan of Care (Signed)
Problem: Activity: Goal: Interest or engagement in activities will improve Outcome: Progressing Pt did go to group this evening Goal: Sleeping patterns will improve Outcome: Progressing Pt slept 6+ hrs last night  Problem: Education: Goal: Emotional status will improve Outcome: Progressing Pt stated he was depressed, but felt a little better Goal: Mental status will improve Outcome: Progressing Pt presented WNL on the unit this evening, no confusion or bizarre thoughts / actions  Problem: Coping: Goal: Ability to demonstrate self-control will improve Outcome: Progressing Pt had no outburst on the unit this evening  Problem: Health Behavior/Discharge Planning: Goal: Compliance with treatment plan for underlying cause of condition will improve Outcome: Progressing Pt taking medications as prescribed  Problem: Safety: Goal: Periods of time without injury will increase Outcome: Progressing Pt safe on the unit at this time  Problem: Self-Concept: Goal: Ability to disclose and discuss suicidal ideas will improve Outcome: Progressing Pt denies SI at this time Goal: Ability to verbalize positive feelings about self will improve Outcome: Progressing Pt stated he felt he was getting better, but still not ready to D/C at this time  Problem: Education: Goal: Understanding of discharge needs will improve Outcome: Progressing Pt said he was not ready to D/ C at this time  Problem: Spiritual Needs Goal: Ability to function at adequate level Outcome: Progressing Pt has been appropriate on the unit at this time

## 2016-11-29 NOTE — [facility] Group Notes (Signed)
Adult Therapy Group Note (Clinical Social Work)  Date:  11/29/2016  Time:  10:00-11:00AM  Group Topic/Focus:  HEALTHY COPING SKILLS  Today's group focused on identifying healthy coping skills already in use by each patient, as well as healthy coping skills they would like to learn.  There was much sharing, support, psychoeducation, and encouragement provided.  Participation Level:  Minimal  Participation Quality:  Attentive  Affect:  Blunted and Depressed  Cognitive:  Appropriate  Insight: Improving  Engagement in Group:  Improving  Modes of Intervention:  Discussion, Support and Processing  Additional Comments:  The patient shared little in group, was almost completely silent although he did joke around with other patients calling him Scooter, then left group after about 30 minutes.  Carloyn JaegerMareida J Grossman-Orr 11/01/2016, 1:28 PM

## 2016-11-30 LAB — GLUCOSE, CAPILLARY
GLUCOSE-CAPILLARY: 107 mg/dL — AB (ref 65–99)
GLUCOSE-CAPILLARY: 154 mg/dL — AB (ref 65–99)
GLUCOSE-CAPILLARY: 210 mg/dL — AB (ref 65–99)
GLUCOSE-CAPILLARY: 167 mg/dL — AB (ref 65–99)

## 2016-11-30 MED ORDER — IBUPROFEN 800 MG PO TABS
800.0000 mg | ORAL_TABLET | Freq: Once | ORAL | Status: AC
  Administered 2016-11-30: 800 mg via ORAL
  Filled 2016-11-30 (×2): qty 1

## 2016-11-30 NOTE — Progress Notes (Signed)
Adult Psychoeducational Group Note  Date:  11/30/2016 Time:  9:13 PM  Group Topic/Focus:  Wrap-Up Group:   The focus of this group is to help patients review their daily goal of treatment and discuss progress on daily workbooks.  Participation Level:  Active  Participation Quality:  Appropriate, Attentive and Sharing  Affect:  Appropriate  Cognitive:  Appropriate  Insight: Appropriate and Improving  Engagement in Group:  Developing/Improving and Supportive  Modes of Intervention:  Discussion, Education and Support  Additional Comments:  Pt shared in group his goal was to socialize more and not being isolative. Pt shared he has being sleep most of the day but wanted to get out and interact more with peers.  Karleen HampshireFox, Dillie Burandt Brittini 11/30/2016, 9:13 PM

## 2016-11-30 NOTE — Plan of Care (Signed)
Problem: Activity: Goal: Interest or engagement in activities will improve Outcome: Progressing Patient did not attend any groups today but did appear bright and was more mobile.  Went to meals Goal: Sleeping patterns will improve Outcome: Progressing Reports sleeping "good"

## 2016-11-30 NOTE — Progress Notes (Signed)
Nursing Note 11/30/2016 [id number]  Data Reports sleeping good without PRN sleep med.  Rates depression 7/10, hopelessness 7/10, and anxiety 8/10. Affect blunted/depressed.  Denies HI, SI, AVH.  Ongoing right foot pain, gait steady, using wheel chair for ambulation, brace in place.  Right foot assessed, mild redness and +1 pedal edema observed.  Remains in room most of day.  Did not attend groups, spent little time in day area.     Action Spoke with patient 1:1, nurse offered support to patient throughout shift.  PRN's for pain.  Continues to be monitored on 15 minute checks for safety.  Response Remains safe on unit.  PRN's "slightly" effective.

## 2016-11-30 NOTE — Progress Notes (Signed)
[facility] MD Progress Note  11/30/2016 2:20 PM KETHAN PAPADOPOULOS  MRN:  [id number]  Subjective:  Octavious reports " I am okay, can you increase my ibuprofen to 800 mg and give my oxycodone for my pain?"  Objective: Donato Schultz [place] awake, alert and oriented resting in bed.  Patient reports he tore his nail off his thumb. Reports sadness and depression. Reports " I did attended group session but then I just left." Continues to denise suicidal or homicidal ideation during this assessment. Denies auditory or visual hallucination and does not appear to be responding to internal stimuli. Patient reports he is medication compliant without mediation side effects and states he is tolerating medications well. Per staffing notes patient continues to isolate and is not engaged in treatment.  Support, encouragement and reassurance was provided.      Principal Problem: MDD (major depressive disorder), recurrent severe, without psychosis (HCC) Diagnosis:   Patient Active Problem List   Diagnosis Date Noted  . MDD (major depressive disorder), recurrent severe, without psychosis (HCC) [F33.2] 11/26/2016  . Suicide attempt (HCC) [T14.91XA] 11/25/2016  . Suicidal ideation [R45.851]   . Bipolar disorder, curr episode mixed, severe, w/o psychotic features (HCC) [F31.63] 05/17/2016  . Cocaine use disorder, severe, dependence (HCC) [F14.20] 05/17/2016  . Affective psychosis, bipolar (HCC) [F31.9]   . Bipolar affective disorder, depressed (HCC) [F31.30] 08/09/2015  . Bipolar disorder with current episode depressed (HCC) [F31.30] 08/09/2015  . Polysubstance abuse [F19.10]   . Type 2 diabetes mellitus with hyperglycemia (HCC) [E11.65] 09/19/2014  . Right leg DVT (HCC) [I82.401] 09/19/2014  . Essential hypertension [I10] 09/19/2014  . Bipolar affective disorder, depressed, severe, with psychotic behavior (HCC) [F31.5] 09/14/2014  . Chronic pain syndrome [G89.4] 09/14/2014  . Cocaine abuse [F14.10] 09/13/2014  . Alcohol use  disorder, mild, abuse [F10.10] 10/12/2012  . Homeless [Z59.0] 09/02/2011  . Opiate abuse, episodic [F11.10] 09/02/2011  . Diabetes mellitus [250] 08/30/2011  . WPW (Wolff-Parkinson-White syndrome) [I45.6] 08/30/2011   Total Time spent with patient: 30 minutes  Past Psychiatric History: Bipolar disorder, MDD, anxiety, multiple SA, multiple psychiatric admissions.   Past Medical History:  Past Medical History:  Diagnosis Date  . Bipolar 1 disorder (HCC)   . Chronic pain   . Depression   . Diabetes mellitus   . Diabetes mellitus without complication (HCC)   . HCV (hepatitis C virus)   . Hepatitis C carrier (HCC)   . Homeless   . Hypertension   . Suicide attempt (HCC)   . WPW (Wolff-Parkinson-White syndrome)     Past Surgical History:  Procedure Laterality Date  . BACK SURGERY    . CHEST SURGERY    . RHINOPLASTY    . TONSILLECTOMY     Family History:  Family History  Problem Relation Age of Onset  . Mental illness Neg Hx    Family Psychiatric  History: See HPI  Social History:  History  Alcohol Use  . Yes    Comment: last drink 24 days     History  Drug Use  . Types: Marijuana, "Crack" cocaine, Cocaine    Comment: last used 09/03/2014    Social History   Social History  . Marital status: Divorced    Spouse name: N/A  . Number of children: N/A  . Years of education: N/A   Social History Main Topics  . Smoking status: Current Every Day Smoker    Packs/day: 1.00    Years: 8.00    Types: Cigarettes  . Smokeless tobacco:  Never Used  . Alcohol use Yes     Comment: last drink 24 days  . Drug use: Yes    Types: Marijuana, "Crack" cocaine, Cocaine     Comment: last used 09/03/2014  . Sexual activity: Not Asked   Other Topics Concern  . None   Social History Narrative   ** Merged History Encounter **       Additional Social History:      Sleep: Poor  Appetite:  Fair  Current Medications: Current Facility-Administered Medications  Medication Dose  Route Frequency Provider Last Rate Last Dose  . acetaminophen (TYLENOL) tablet 650 mg  650 mg Oral Q6H PRN Laveda AbbeParks, Laurie Britton, NP   650 mg at 11/27/16 [id number]  . albuterol (PROVENTIL HFA;VENTOLIN HFA) 108 (90 Base) MCG/ACT inhaler 2 puff  2 puff Inhalation Q6H PRN Laveda AbbeParks, Laurie Britton, NP      . alum & mag hydroxide-simeth (MAALOX/MYLANTA) 200-200-20 MG/5ML suspension 30 mL  30 mL Oral Q4H PRN Laveda AbbeParks, Laurie Britton, NP      . ARIPiprazole (ABILIFY) tablet 7.5 mg  7.5 mg Oral BID Laveda AbbeParks, Laurie Britton, NP   7.5 mg at 11/30/16 0846  . canagliflozin [facility](INVOKANA) tablet 300 mg  300 mg Oral Daily Laveda AbbeParks, Laurie Britton, NP   300 mg at 11/30/16 0843  . citalopram (CELEXA) tablet 10 mg  10 mg Oral Daily Cobos, Rockey SituFernando A, MD   10 mg at 11/30/16 0842  . cloNIDine (CATAPRES) tablet 0.2 mg  0.2 mg Oral BID Laveda AbbeParks, Laurie Britton, NP   0.2 mg at 11/30/16 [id number]  . fluticasone (FLONASE) 50 MCG/ACT nasal spray 1 spray  1 spray Each Nare BID Armandina StammerNwoko, Agnes I, NP   1 spray at 11/30/16 [phone]  . gabapentin (NEURONTIN) capsule 300 mg  300 mg Oral TID Cobos, Rockey SituFernando A, MD   300 mg at 11/30/16 1129  . hydrOXYzine (ATARAX/VISTARIL) tablet 50 mg  50 mg Oral TID PRN Oneta RackLewis, Tanika N, NP   50 mg at 11/29/16 2311  . ibuprofen (ADVIL,MOTRIN) tablet 600 mg  600 mg Oral Q6H PRN Denzil Magnusonhomas, Lashunda, NP   600 mg at 11/30/16 0848  . insulin aspart (novoLOG) injection 0-15 Units  0-15 Units Subcutaneous TID WC Donell SievertSimon, Spencer E, PA-C   3 Units at 11/30/16 1213  . insulin aspart (novoLOG) injection 0-5 Units  0-5 Units Subcutaneous QHS Kerry HoughSimon, Spencer E, PA-C   2 Units at 11/29/16 2202  . insulin glargine (LANTUS) injection 60 Units  60 Units Subcutaneous QHS Laveda AbbeParks, Laurie Britton, NP   60 Units at 11/29/16 2201  . magnesium hydroxide (MILK OF MAGNESIA) suspension 30 mL  30 mL Oral Daily PRN Laveda AbbeParks, Laurie Britton, NP      . nicotine (NICODERM CQ - dosed in mg/24 hours) patch 21 mg  21 mg Transdermal Daily Donell SievertSimon, Spencer E, PA-C   21 mg at  11/30/16 0843  . pantoprazole (PROTONIX) EC tablet 40 mg  40 mg Oral Daily Laveda AbbeParks, Laurie Britton, NP   40 mg at 11/30/16 [id number]  . tiotropium (SPIRIVA) inhalation capsule 18 mcg  18 mcg Inhalation Daily Laveda AbbeParks, Laurie Britton, NP   18 mcg at 11/30/16 [phone]  . tiZANidine (ZANAFLEX) tablet 4 mg  4 mg Oral TID Denzil Magnusonhomas, Lashunda, NP   4 mg at 11/30/16 1129  . traZODone (DESYREL) tablet 100 mg  100 mg Oral QHS PRN Cobos, Rockey SituFernando A, MD   100 mg at 11/29/16 2310    Lab Results:  Results for orders placed or performed during  the hospital encounter of 11/26/16 (from the past 48 hour(s))  Glucose, capillary     Status: Abnormal   Collection Time: 11/28/16  4:55 PM  Result Value Ref Range   Glucose-Capillary 184 (H) 65 - 99 mg/dL  Glucose, capillary     Status: Abnormal   Collection Time: 11/28/16  9:13 PM  Result Value Ref Range   Glucose-Capillary 136 (H) 65 - 99 mg/dL   Comment 1 Notify RN   Glucose, capillary     Status: Abnormal   Collection Time: 11/29/16  5:49 AM  Result Value Ref Range   Glucose-Capillary 149 (H) 65 - 99 mg/dL   Comment 1 Notify RN   Glucose, capillary     Status: Abnormal   Collection Time: 11/29/16 11:53 AM  Result Value Ref Range   Glucose-Capillary 140 (H) 65 - 99 mg/dL  Glucose, capillary     Status: Abnormal   Collection Time: 11/29/16  5:03 PM  Result Value Ref Range   Glucose-Capillary 178 (H) 65 - 99 mg/dL  Glucose, capillary     Status: Abnormal   Collection Time: 11/29/16  9:02 PM  Result Value Ref Range   Glucose-Capillary 206 (H) 65 - 99 mg/dL  Glucose, capillary     Status: Abnormal   Collection Time: 11/30/16  6:14 AM  Result Value Ref Range   Glucose-Capillary 107 (H) 65 - 99 mg/dL  Glucose, capillary     Status: Abnormal   Collection Time: 11/30/16 11:34 AM  Result Value Ref Range   Glucose-Capillary 154 (H) 65 - 99 mg/dL    Blood Alcohol level:  Lab Results  Component Value Date   ETH 71 (H) 05/15/2016   ETH <5 08/08/2015    Metabolic  Disorder Labs: Lab Results  Component Value Date   HGBA1C 7.0 (H) 11/27/2016   MPG 154 11/27/2016   MPG 203 05/17/2016   Lab Results  Component Value Date   PROLACTIN 10.8 05/19/2016   Lab Results  Component Value Date   CHOL 202 (H) 05/17/2016   TRIG 211 (H) 05/17/2016   HDL 38 (L) 05/17/2016   CHOLHDL 5.3 05/17/2016   VLDL 42 (H) 05/17/2016   LDLCALC 122 (H) 05/17/2016    Physical Findings: AIMS: Facial and Oral Movements Muscles of Facial Expression: None, normal Lips and Perioral Area: None, normal Jaw: None, normal Tongue: None, normal,Extremity Movements Upper (arms, wrists, hands, fingers): None, normal Lower (legs, knees, ankles, toes): None, normal, Trunk Movements Neck, shoulders, hips: None, normal, Overall Severity Severity of abnormal movements (highest score from questions above): None, normal Incapacitation due to abnormal movements: None, normal Patient's awareness of abnormal movements (rate only patient's report): No Awareness, Dental Status Current problems with teeth and/or dentures?: No Does patient usually wear dentures?: No  CIWA:    COWS:     Musculoskeletal: Strength & Muscle Tone: within normal limits Gait & Station: normal Patient leans: N/A  Psychiatric Specialty Exam: Physical Exam  Nursing note and vitals reviewed. Constitutional: He is oriented to person, place, and time. He appears well-developed.  Cardiovascular: Normal rate.   Neurological: He is alert and oriented to person, place, and time.  Patient reports broken/frx toes  Psychiatric: He has a normal mood and affect. His behavior is normal.    Review of Systems  Musculoskeletal: Positive for back pain.  Psychiatric/Behavioral: Positive for depression. Negative for hallucinations, memory loss, substance abuse and suicidal ideas. The patient is nervous/anxious and has insomnia.   All other systems reviewed and  are negative.   Blood pressure (!) 129/92, pulse 95, temperature  98.9 F (37.2 C), resp. rate 16, height 5\' 9"  (1.753 m), weight 85.7 kg (189 lb).Body mass index is 27.91 kg/m.  General Appearance: Fairly Groomed and Guarded wheelchair at bedside  Eye Contact:  Good  Speech:  Slow  Volume:  Decreased  Mood:  Anxious and Depressed  Affect:  Constricted and Depressed  Thought Process:  Coherent  Orientation:  Full (Time, Place, and Person)  Thought Content:  Logical no Preoccupations or ruminations   Suicidal Thoughts:  No  Homicidal Thoughts:  No  Memory:  Immediate;   Fair Recent;   Fair  Judgement:  Impaired  Insight:  Fair  Psychomotor Activity:  Decreased  Concentration:  Concentration: Fair and Attention Span: Fair  Recall:  [employer] of Knowledge:  Fair  Language:  Good  Akathisia:  Negative  Handed:  Right  AIMS (if indicated):     Assets:  Communication Skills Desire for Improvement Resilience  ADL's:  Intact  Cognition:  WNL  Sleep:  Number of Hours: 6.25     I agree with current treatment plan on 11/30/2016, Patient seen face-to-face for psychiatric evaluation follow-up, chart reviewed. Reviewed the information documented and agree with the treatment plan.  Treatment Plan Summary: Daily contact with patient to assess and evaluate symptoms and progress in treatment   Continue with current treatment plan on 11/30/2016, except where noted  Continue with Celexa 10 mg, Neurontin 300 mg and Abilify 7.5mg   for mood stabilization. Continue with Trazodone 100 mg for insomnia  Will continue to monitor vitals ,medication compliance and treatment side effects while patient is here.  Reviewed labs BAL - , UDS - TSH 0.132 will add T3/T4- pending results on 11/30/2016 CSW will start working on disposition.  Patient to participate in therapeutic milieu    Oneta Rack, NP 11/30/2016, 2:20 PM Notes reviewed and agree with plan

## 2016-11-30 NOTE — [facility] Group Notes (Signed)
Patient invited to attend Nursing Education group however elected not to attend. 

## 2016-11-30 NOTE — [facility] Group Notes (Signed)
[facility]:  (Clinical Social Work)   11/09/2016    1:00-2:00PM  Summary of Progress/Problems:   The main focus of today's process group was to   1)  Identify current healthy supports  2)  discuss importance of adding supports to stay well once out of the hospital  3)  generate ideas about what healthy supports can be added   An emphasis was placed on using counselor, doctor, therapy groups, 12-step groups, and problem-specific support groups to expand supports.    The patient stated he has no current healthy supports because [facility] therapist for the last 3 years works; therefore, he can no longer see her.  He is considering dropping that insurance.  He knows that he needs therapy and medication management.  Type of Therapy:  Process Group with Motivational Interviewing  Participation Level:  Active  Participation Quality:  Attentive and Sharing  Affect:  Flat  Cognitive:  Alert  Insight:  Limited  Engagement in Therapy:  Engaged  Modes of Intervention:   Education, Support and Processing  Ambrose MantleMareida Grossman-Orr, LCSW 11/09/2016    2:17 PM

## 2016-12-01 LAB — GLUCOSE, CAPILLARY
GLUCOSE-CAPILLARY: 119 mg/dL — AB (ref 65–99)
GLUCOSE-CAPILLARY: 142 mg/dL — AB (ref 65–99)
GLUCOSE-CAPILLARY: 172 mg/dL — AB (ref 65–99)
Glucose-Capillary: 170 mg/dL — ABNORMAL HIGH (ref 65–99)

## 2016-12-01 LAB — T4, FREE: FREE T4: 0.66 ng/dL (ref 0.61–1.12)

## 2016-12-01 MED ORDER — IBUPROFEN 800 MG PO TABS
800.0000 mg | ORAL_TABLET | Freq: Four times a day (QID) | ORAL | Status: DC | PRN
  Administered 2016-12-01 – 2016-12-04 (×9): 800 mg via ORAL
  Filled 2016-12-01 (×9): qty 1

## 2016-12-01 NOTE — Plan of Care (Signed)
Problem: Activity: Goal: Interest or engagement in activities will improve Outcome: Not Progressing Not attending groups, continues to go to meals Goal: Sleeping patterns will improve Outcome: Progressing Reports sleeping fair last night

## 2016-12-01 NOTE — [facility] Group Notes (Signed)
[facility] LCSW Group Therapy  12/01/2016 1:15pm  Type of Therapy:  Group Therapy vercoming Obstacles  Pt did not attend, declined invitation.   Vernie ShanksLauren Javid Kemler, LCSW 12/01/2016 3:41 PM

## 2016-12-01 NOTE — Progress Notes (Signed)
Nursing Note 12/01/2016 [id number]  Data Reports sleeping fair without PRN sleep med.  Rates depression 7/10, hopelessness 7/10, and anxiety 7/10. Affect blunted but appropraite mood.  Denies HI, SI, AVH.  Ongoing right foot pain.  Went to AM group but did not go to afternoon group "I couldn't go in that small piano room I would have a panic attack."  Patient spent a lot of afternoon in dayroom.  Played cards with RN, talked about what brought him into the hospital.  Discussed how he gets "$10,000 a year in disability, I can afford my small apartment and pretty much eat Ramen and chicken patties.  When that's your lifestyle it's hard to find a reason to live."  Action Spoke with patient 1:1, nurse offered support to patient throughout shift.  Continues to be monitored on 15 minute checks for safety.  Response Remains safe on unit, appears to be more engaged and brighter today.

## 2016-12-01 NOTE — [facility] Group Notes (Signed)
[facility] LCSW Group Therapy  12/01/2016 1:15pm  Type of Therapy: Group Therapy   Topic: Overcoming Obstacles  Participation Level: Pt invited. Did not attend.  Billy [name] B Kinser Fellman, MSW, LCSWA [phone]    

## 2016-12-01 NOTE — [facility] Group Notes (Signed)
[facility]  12/01/2016 8:45 AM  Pt did not attend, declined invitation.   Vernie ShanksLauren Walter Grima, LCSW 12/01/2016 3:41 PM

## 2016-12-01 NOTE — Plan of Care (Signed)
Problem: Education: Goal: Emotional status will improve Outcome: Progressing Affect brighter today  Problem: Coping: Goal: Ability to demonstrate self-control will improve Outcome: Completed/Met Date Met: 12/01/16 Patient has remained calm throughout admission, no safety concerns, able to verbalize feelings and frustrations without escalating.

## 2016-12-01 NOTE — Progress Notes (Signed)
Adult Psychoeducational Group Note  Date:  12/01/2016 Time:  10:43 PM  Group Topic/Focus:  Wrap-Up Group:   The focus of this group is to help patients review their daily goal of treatment and discuss progress on daily workbooks.  Participation Level:  Active  Participation Quality:  Appropriate, Attentive and Sharing  Affect:  Appropriate  Cognitive:  Appropriate  Insight: Appropriate and Good  Engagement in Group:  Developing/Improving and Supportive  Modes of Intervention:  Discussion, Education, Socialization and Support  Additional Comments:  Pt shared during group he was able to make it 2 groups and that he was able to be in the dayroom.   Karleen HampshireFox, Rhesa Forsberg Brittini 12/01/2016, 10:43 PM

## 2016-12-01 NOTE — Progress Notes (Signed)
[facility] MD Progress Note  12/01/2016 1:57 PM Billy [name]  MRN:  [id number]  Subjective:  Billy [name] reports "My mood is improving, getting better, but I'm in a lot of pain to my foot. I need something stronger for this pain.  Objective: Billy [name] is seen, chart reviewed. He is awake, alert and oriented resting in bed. He reports improved mood, but worsening pain episodes. He says he is attending & participating in the group sessions. He denies suicidal or homicidal ideation during this assessment. Denies auditory or visual hallucination and does not appear to be responding to internal stimuli. Patient reports he is medication compliant without mediation side effects and states he is tolerating medications well. Per staffing notes patient continues to isolate and is not engaged in treatment. Support, encouragement and reassurance was provided. Increased Ibuprofen to 800 mg Q 6 hours for pain management.  Principal Problem: MDD (major depressive disorder), recurrent severe, without psychosis (HCC) Diagnosis:   Patient Active Problem List   Diagnosis Date Noted  . MDD (major depressive disorder), recurrent severe, without psychosis (HCC) [F33.2] 11/26/2016  . Suicide attempt (HCC) [T14.91XA] 11/25/2016  . Suicidal ideation [R45.851]   . Bipolar disorder, curr episode mixed, severe, w/o psychotic features (HCC) [F31.63] 05/17/2016  . Cocaine use disorder, severe, dependence (HCC) [F14.20] 05/17/2016  . Affective psychosis, bipolar (HCC) [F31.9]   . Bipolar affective disorder, depressed (HCC) [F31.30] 08/09/2015  . Bipolar disorder with current episode depressed (HCC) [F31.30] 08/09/2015  . Polysubstance abuse [F19.10]   . Type 2 diabetes mellitus with hyperglycemia (HCC) [E11.65] 09/19/2014  . Right leg DVT (HCC) [I82.401] 09/19/2014  . Essential hypertension [I10] 09/19/2014  . Bipolar affective disorder, depressed, severe, with psychotic behavior (HCC) [F31.5] 09/14/2014  . Chronic pain syndrome  [G89.4] 09/14/2014  . Cocaine abuse [F14.10] 09/13/2014  . Alcohol use disorder, mild, abuse [F10.10] 10/12/2012  . Homeless [Z59.0] 09/02/2011  . Opiate abuse, episodic [F11.10] 09/02/2011  . Diabetes mellitus [250] 08/30/2011  . WPW (Wolff-Parkinson-White syndrome) [I45.6] 08/30/2011   Total Time spent with patient: 15 minutes  Past Psychiatric History: Bipolar disorder, MDD, anxiety, multiple SA, multiple psychiatric admissions.   Past Medical History:  Past Medical History:  Diagnosis Date  . Bipolar 1 disorder (HCC)   . Chronic pain   . Depression   . Diabetes mellitus   . Diabetes mellitus without complication (HCC)   . HCV (hepatitis C virus)   . Hepatitis C carrier (HCC)   . Homeless   . Hypertension   . Suicide attempt (HCC)   . WPW (Wolff-Parkinson-White syndrome)     Past Surgical History:  Procedure Laterality Date  . BACK SURGERY    . CHEST SURGERY    . RHINOPLASTY    . TONSILLECTOMY     Family History:  Family History  Problem Relation Age of Onset  . Mental illness Neg Hx    Family Psychiatric  History: See HPI   Social History:  History  Alcohol Use  . Yes    Comment: last drink 24 days     History  Drug Use  . Types: Marijuana, "Crack" cocaine, Cocaine    Comment: last used 09/03/2014    Social History   Social History  . Marital status: Divorced    Spouse name: N/A  . Number of children: N/A  . Years of education: N/A   Social History Main Topics  . Smoking status: Current Every Day Smoker    Packs/day: 1.00    Years: 8.00  Types: Cigarettes  . Smokeless tobacco: Never Used  . Alcohol use Yes     Comment: last drink 24 days  . Drug use: Yes    Types: Marijuana, "Crack" cocaine, Cocaine     Comment: last used 09/03/2014  . Sexual activity: Not Asked   Other Topics Concern  . None   Social History Narrative   ** Merged History Encounter **       Additional Social History:   Sleep: Fair  Appetite:  Fair  Current  Medications: Current Facility-Administered Medications  Medication Dose Route Frequency Provider Last Rate Last Dose  . acetaminophen (TYLENOL) tablet 650 mg  650 mg Oral Q6H PRN Laveda AbbeParks, Laurie Britton, NP   650 mg at 11/27/16 [id number]  . albuterol (PROVENTIL HFA;VENTOLIN HFA) 108 (90 Base) MCG/ACT inhaler 2 puff  2 puff Inhalation Q6H PRN Laveda AbbeParks, Laurie Britton, NP      . alum & mag hydroxide-simeth (MAALOX/MYLANTA) 200-200-20 MG/5ML suspension 30 mL  30 mL Oral Q4H PRN Laveda AbbeParks, Laurie Britton, NP      . ARIPiprazole (ABILIFY) tablet 7.5 mg  7.5 mg Oral BID Laveda AbbeParks, Laurie Britton, NP   7.5 mg at 12/01/16 0741  . canagliflozin (INVOKANA) tablet 300 mg  300 mg Oral Daily Laveda AbbeParks, Laurie Britton, NP   300 mg at 12/01/16 0741  . citalopram (CELEXA) tablet 10 mg  10 mg Oral Daily Cobos, Rockey SituFernando A, MD   10 mg at 12/01/16 0741  . cloNIDine (CATAPRES) tablet 0.2 mg  0.2 mg Oral BID Laveda AbbeParks, Laurie Britton, NP   0.2 mg at 12/01/16 0741  . fluticasone (FLONASE) 50 MCG/ACT nasal spray 1 spray  1 spray Each Nare BID Armandina StammerNwoko, Weslie Rasmus I, NP   1 spray at 12/01/16 0739  . gabapentin (NEURONTIN) capsule 300 mg  300 mg Oral TID Cobos, Rockey SituFernando A, MD   300 mg at 12/01/16 1202  . hydrOXYzine (ATARAX/VISTARIL) tablet 50 mg  50 mg Oral TID PRN Oneta RackLewis, Tanika N, NP   50 mg at 11/30/16 2319  . ibuprofen (ADVIL,MOTRIN) tablet 800 mg  800 mg Oral Q6H PRN Armandina StammerNwoko, Caridad Silveira I, NP      . insulin aspart (novoLOG) injection 0-15 Units  0-15 Units Subcutaneous TID WC Donell SievertSimon, Spencer E, PA-C   3 Units at 12/01/16 1204  . insulin aspart (novoLOG) injection 0-5 Units  0-5 Units Subcutaneous QHS Kerry HoughSimon, Spencer E, PA-C   2 Units at 11/29/16 2202  . insulin glargine (LANTUS) injection 60 Units  60 Units Subcutaneous QHS Laveda AbbeParks, Laurie Britton, NP   60 Units at 11/30/16 2153  . magnesium hydroxide (MILK OF MAGNESIA) suspension 30 mL  30 mL Oral Daily PRN Laveda AbbeParks, Laurie Britton, NP      . nicotine (NICODERM CQ - dosed in mg/24 hours) patch 21 mg  21 mg  Transdermal Daily Donell SievertSimon, Spencer E, PA-C   21 mg at 12/01/16 0741  . pantoprazole (PROTONIX) EC tablet 40 mg  40 mg Oral Daily Laveda AbbeParks, Laurie Britton, NP   40 mg at 12/01/16 0741  . tiotropium (SPIRIVA) inhalation capsule 18 mcg  18 mcg Inhalation Daily Laveda AbbeParks, Laurie Britton, NP   18 mcg at 12/01/16 0739  . tiZANidine (ZANAFLEX) tablet 4 mg  4 mg Oral TID Denzil Magnusonhomas, Lashunda, NP   4 mg at 12/01/16 1202  . traZODone (DESYREL) tablet 100 mg  100 mg Oral QHS PRN Cobos, Rockey SituFernando A, MD   100 mg at 11/30/16 2318   Lab Results:  Results for orders placed or performed during  the hospital encounter of 11/26/16 (from the past 48 hour(s))  Glucose, capillary     Status: Abnormal   Collection Time: 11/29/16  5:03 PM  Result Value Ref Range   Glucose-Capillary 178 (H) 65 - 99 mg/dL  Glucose, capillary     Status: Abnormal   Collection Time: 11/29/16  9:02 PM  Result Value Ref Range   Glucose-Capillary 206 (H) 65 - 99 mg/dL  Glucose, capillary     Status: Abnormal   Collection Time: 11/30/16  6:14 AM  Result Value Ref Range   Glucose-Capillary 107 (H) 65 - 99 mg/dL  Glucose, capillary     Status: Abnormal   Collection Time: 11/30/16 11:34 AM  Result Value Ref Range   Glucose-Capillary 154 (H) 65 - 99 mg/dL  Glucose, capillary     Status: Abnormal   Collection Time: 11/30/16  4:53 PM  Result Value Ref Range   Glucose-Capillary 210 (H) 65 - 99 mg/dL   Comment 1 Notify RN    Comment 2 Document in Chart   Glucose, capillary     Status: Abnormal   Collection Time: 11/30/16  8:42 PM  Result Value Ref Range   Glucose-Capillary 167 (H) 65 - 99 mg/dL  Glucose, capillary     Status: Abnormal   Collection Time: 12/01/16  5:57 AM  Result Value Ref Range   Glucose-Capillary 119 (H) 65 - 99 mg/dL  T4, free     Status: None   Collection Time: 12/01/16  6:10 AM  Result Value Ref Range   Free T4 0.66 0.61 - 1.12 ng/dL    Comment: (NOTE) Biotin ingestion may interfere with free T4 tests. If the results  are inconsistent with the TSH level, previous test results, or the clinical presentation, then consider biotin interference. If needed, order repeat testing after stopping biotin. Performed at [facility], [address], [place], [place] [address]   Glucose, capillary     Status: Abnormal   Collection Time: 12/01/16 11:59 AM  Result Value Ref Range   Glucose-Capillary 170 (H) 65 - 99 mg/dL   Blood Alcohol level:  Lab Results  Component Value Date   ETH 71 (H) 05/15/2016   ETH <5 08/08/2015   Metabolic Disorder Labs: Lab Results  Component Value Date   HGBA1C 7.0 (H) 11/27/2016   MPG 154 11/27/2016   MPG 203 05/17/2016   Lab Results  Component Value Date   PROLACTIN 10.8 05/19/2016   Lab Results  Component Value Date   CHOL 202 (H) 05/17/2016   TRIG 211 (H) 05/17/2016   HDL 38 (L) 05/17/2016   CHOLHDL 5.3 05/17/2016   VLDL 42 (H) 05/17/2016   LDLCALC 122 (H) 05/17/2016   Physical Findings: AIMS: Facial and Oral Movements Muscles of Facial Expression: None, normal Lips and Perioral Area: None, normal Jaw: None, normal Tongue: None, normal,Extremity Movements Upper (arms, wrists, hands, fingers): None, normal Lower (legs, knees, ankles, toes): None, normal, Trunk Movements Neck, shoulders, hips: None, normal, Overall Severity Severity of abnormal movements (highest score from questions above): None, normal Incapacitation due to abnormal movements: None, normal Patient's awareness of abnormal movements (rate only patient's report): No Awareness, Dental Status Current problems with teeth and/or dentures?: No Does patient usually wear dentures?: No  CIWA:    COWS:     Musculoskeletal: Strength & Muscle Tone: within normal limits Gait & Station: normal, Currently uses wheel chair to aid his mobility, has frctured right toe. Wears a right foot brace. Patient leans:  N/A  Psychiatric Specialty Exam: Physical Exam  Nursing note and vitals  reviewed. Constitutional: He is oriented to person, place, and time. He appears well-developed.  Cardiovascular: Normal rate.   Musculoskeletal: He exhibits tenderness.  Wear a right foot brace due to fractured toe.  Neurological: He is alert and oriented to person, place, and time.  Patient reports broken/frx toes  Skin: Skin is warm.  Psychiatric: He has a normal mood and affect. His behavior is normal.    Review of Systems  Musculoskeletal: Positive for back pain.  Psychiatric/Behavioral: Positive for depression. Negative for hallucinations, memory loss, substance abuse and suicidal ideas. The patient is nervous/anxious and has insomnia.   All other systems reviewed and are negative.   Blood pressure (!) 128/98, pulse 97, temperature 98 F (36.7 C), temperature source Oral, resp. rate 16, height 5\' 9"  (1.753 m), weight 85.7 kg (189 lb).Body mass index is 27.91 kg/m.  General Appearance: Fairly Groomed, wheelchair at bedside  Eye Contact:  Good  Speech:  Slow  Volume:  Decreased  Mood:  "My mood is improving"  Affect:  Congruent  Thought Process:  Coherent and Linear  Orientation:  Full (Time, Place, and Person)  Thought Content:  Logical, no Preoccupations or ruminations   Suicidal Thoughts:  Denies any thoughts, plans or intent.  Homicidal Thoughts:  Denies any thoughts, plans or intent.  Memory:  Immediate;   Good Recent;   Good Remote;   Good  Judgement:  Good  Insight:  Present  Psychomotor Activity:  Normal  Concentration:  Concentration: Good and Attention Span: Good  Recall:  Good  Fund of Knowledge:  Good  Language:  Good  Akathisia:  Negative  Handed:  Right  AIMS (if indicated):     Assets:  Communication Skills Desire for Improvement Resilience  ADL's:  Intact  Cognition:  WNL  Sleep:  Number of Hours: 6   Treatment Plan Summary: Daily contact with patient to assess and evaluate symptoms and progress in treatment   Continue with current treatment plan  on 12/01/2016, except where noted  Continue with Celexa 10 mg for depression Continue with Neurontin 300 mg for agitation/pain management. Continue Abilify 7.5mg   For control. Continue with Trazodone 100 mg for insomnia  Continue to monitor vitals, medication compliance and treatment side effects while patient is here.  Reviewed labs BAL -  UDS - TSH 0.132 will add T4- result within norm. CSW will start working on disposition.  Patient to participate in therapeutic milieu  Sanjuana Kava, NP, PMHNP, FNP-BC. 12/01/2016, 1:57 PM Patient ID: [name], male   DOB: 07/21/1960, 56 y.o.   MRN: [id number]

## 2016-12-01 NOTE — Progress Notes (Signed)
Adult Psychoeducational Group Note  Date:  12/01/2016 Time:  1:43 PM  Group Topic/Focus:  Wellness Toolbox:   The focus of this group is to discuss various aspects of wellness, balancing those aspects and exploring ways to increase the ability to experience wellness.  Patients will create a wellness toolbox for use upon discharge.  Participation Level:  Active  Participation Quality:  Appropriate and Attentive  Affect:  Appropriate  Cognitive:  Alert  Insight: Good  Engagement in Group:  Engaged  Modes of Intervention:  Discussion and Education  Additional Comments:  Pt shared that he would like to socialize more but was having a hard time staying awake.  Aubrianna Orchard E 12/01/2016, 1:43 PM

## 2016-12-02 ENCOUNTER — Encounter (HOSPITAL_COMMUNITY): Payer: Self-pay | Admitting: Behavioral Health

## 2016-12-02 LAB — GLUCOSE, CAPILLARY
GLUCOSE-CAPILLARY: 108 mg/dL — AB (ref 65–99)
GLUCOSE-CAPILLARY: 145 mg/dL — AB (ref 65–99)
Glucose-Capillary: 144 mg/dL — ABNORMAL HIGH (ref 65–99)
Glucose-Capillary: 172 mg/dL — ABNORMAL HIGH (ref 65–99)

## 2016-12-02 MED ORDER — CITALOPRAM HYDROBROMIDE 20 MG PO TABS
20.0000 mg | ORAL_TABLET | Freq: Every day | ORAL | Status: DC
  Administered 2016-12-03 – 2016-12-04 (×2): 20 mg via ORAL
  Filled 2016-12-02 (×4): qty 1

## 2016-12-02 NOTE — Progress Notes (Signed)
Adult Psychoeducational Group Note  Date:  12/02/2016 Time:  9:39 PM  Group Topic/Focus:  Wrap-Up Group:   The focus of this group is to help patients review their daily goal of treatment and discuss progress on daily workbooks.  Participation Level:  Active  Participation Quality:  Appropriate  Affect:  Appropriate  Cognitive:  Alert  Insight: Appropriate  Engagement in Group:  Engaged  Modes of Intervention:  Discussion  Additional Comments:  Patient stated having a wonderful day. Patient's goal for today was to attend all groups. Patient met goal.   Kennard Fildes L Conita Amenta 12/02/2016, 9:39 PM

## 2016-12-02 NOTE — [facility] Group Notes (Signed)
[facility] Group Notes:  (Nursing/MHT/Case Management/Adjunct)  Date:  12/02/2016  Time:  2:14 PM  Type of Therapy:  Psychoeducational Skills  Participation Level:  Did Not Attend  Participation Quality:  DID NOT ATTEND  Affect:  DID NOT ATTEND  Cognitive:  DID NOT ATTEND  Insight:  None  Engagement in Group:  DID NOT ATTEND  Modes of Intervention:  DID NOT ATTEND  Summary of Progress/Problems: Pt did not attend patient self inventory group.   Bethann PunchesJane O Theus Espin 12/02/2016, 2:14 PM

## 2016-12-02 NOTE — Progress Notes (Signed)
Billy [name]. Billy [name] had been up and visible in milieu this evening, attended and participated in group activity. Billy [name] spoke about getting up and going to more activities instead of isolating as he reports that's what he usually does when he is depressed. Billy [name] was also seen interacting appropriately with peers in milieu and was able to receive bedtime medications without incident. A. Support and encouragement provided. R. Safety maintained, will continue to monitor.

## 2016-12-02 NOTE — Progress Notes (Signed)
DAR NOTE Pt complained pain from his right toes and Advil 800 mg given. Pt stayed in the bed most of the morning, pt stated he needed to rest his leg, did no attend the group. Pt got up for medication and meals. Pt advised choose his diet as the pt is diabetic.  Pt stayed up most of the after noon, was observed interacting with peers without any problems.   As per self inventory, pt had a fair night sleep, good appetite, normal energy, and poor concentration. Pt rate depression at 7, hopeless ness at 7, and anxiety at 7. Pt's safety ensured with 15 minute and environmental checks. Pt currently denies SI/HI and A/V hallucinations. Pt verbally agrees to seek staff if SI/HI or A/VH occurs and to consult with staff before acting on these thoughts. Will continue POC.

## 2016-12-02 NOTE — [facility] Group Notes (Signed)
[facility] LCSW Group Therapy 12/02/2016 1:15 PM  Type of Therapy: Group Therapy- Feelings about Diagnosis  Participation Level: Active   Participation Quality:  Appropriate  Affect:  Appropriate  Cognitive: Alert and Oriented   Insight:  Developing   Engagement in Therapy: Developing/Improving and Engaged   Modes of Intervention: Clarification, Confrontation, Discussion, Education, Exploration, Limit-setting, Orientation, Problem-solving, Rapport Building, Dance movement psychotherapisteality Testing, Socialization and Support  Description of Group:   This group will allow patients to explore their thoughts and feelings about diagnoses they have received. Patients will be guided to explore their level of understanding and acceptance of these diagnoses. Facilitator will encourage patients to process their thoughts and feelings about the reactions of others to their diagnosis, and will guide patients in identifying ways to discuss their diagnosis with significant others in their lives. This group will be process-oriented, with patients participating in exploration of their own experiences as well as giving and receiving support and challenge from other group members.  Summary of Progress/Problems:  Pt states that his depression makes it difficult for him to want to get up and do anything. Pt reports that he will isolate because when he is very depressed he does not want to be around anyone.  Therapeutic Modalities:   Cognitive Behavioral Therapy Solution Focused Therapy Motivational Interviewing Relapse Prevention Therapy  Billy [name], MSW, LCSW 12/02/2016 4:10 PM

## 2016-12-02 NOTE — Progress Notes (Signed)
[facility] MD Progress Note  12/02/2016 12:03 PM Billy [name]  MRN:  [id number]  Subjective:  [name] reports "Think I am doing pretty good. Still having some foot pain but the doctor increased the pain medicine and its a little helpful.".  Objective: Billy [name] is seen, chart reviewed. During this evaluation, patient is  awake, alert and oriented x4, calm and cooperative. Patients endorses overall improvement in mood and depressive symptoms. He continues to endorse some mild foot pain yet notes some improvement with titrations in pain medication. No behavioral problems have been reported or observed although staff does report that patient is isolated at times.  He denies suicidal or homicidal ideation during this assessment. Denies auditory or visual hallucination and does not appear to be responding to internal stimuli. Remains complaint with medications reporting they are well tolerated and without side effects.Support, encouragement and reassurance was provided. He is able to contract for safety at this time.    Principal Problem: MDD (major depressive disorder), recurrent severe, without psychosis (HCC) Diagnosis:   Patient Active Problem List   Diagnosis Date Noted  . MDD (major depressive disorder), recurrent severe, without psychosis (HCC) [F33.2] 11/26/2016  . Suicide attempt (HCC) [T14.91XA] 11/25/2016  . Suicidal ideation [R45.851]   . Bipolar disorder, curr episode mixed, severe, w/o psychotic features (HCC) [F31.63] 05/17/2016  . Cocaine use disorder, severe, dependence (HCC) [F14.20] 05/17/2016  . Affective psychosis, bipolar (HCC) [F31.9]   . Bipolar affective disorder, depressed (HCC) [F31.30] 08/09/2015  . Bipolar disorder with current episode depressed (HCC) [F31.30] 08/09/2015  . Polysubstance abuse [F19.10]   . Type 2 diabetes mellitus with hyperglycemia (HCC) [E11.65] 09/19/2014  . Right leg DVT (HCC) [I82.401] 09/19/2014  . Essential hypertension [I10] 09/19/2014  . Bipolar  affective disorder, depressed, severe, with psychotic behavior (HCC) [F31.5] 09/14/2014  . Chronic pain syndrome [G89.4] 09/14/2014  . Cocaine abuse [F14.10] 09/13/2014  . Alcohol use disorder, mild, abuse [F10.10] 10/12/2012  . Homeless [Z59.0] 09/02/2011  . Opiate abuse, episodic [F11.10] 09/02/2011  . Diabetes mellitus [250] 08/30/2011  . WPW (Wolff-Parkinson-White syndrome) [I45.6] 08/30/2011   Total Time spent with patient: 20 minutes  Past Psychiatric History: Bipolar disorder, MDD, anxiety, multiple SA, multiple psychiatric admissions.   Past Medical History:  Past Medical History:  Diagnosis Date  . Bipolar 1 disorder (HCC)   . Chronic pain   . Depression   . Diabetes mellitus   . Diabetes mellitus without complication (HCC)   . HCV (hepatitis C virus)   . Hepatitis C carrier (HCC)   . Homeless   . Hypertension   . Suicide attempt (HCC)   . WPW (Wolff-Parkinson-White syndrome)     Past Surgical History:  Procedure Laterality Date  . BACK SURGERY    . CHEST SURGERY    . RHINOPLASTY    . TONSILLECTOMY     Family History:  Family History  Problem Relation Age of Onset  . Mental illness Neg Hx    Family Psychiatric  History: See HPI   Social History:  History  Alcohol Use  . Yes    Comment: last drink 24 days     History  Drug Use  . Types: Marijuana, "Crack" cocaine, Cocaine    Comment: last used 09/03/2014    Social History   Social History  . Marital status: Divorced    Spouse name: N/A  . Number of children: N/A  . Years of education: N/A   Social History Main Topics  . Smoking status: Current Every  Day Smoker    Packs/day: 1.00    Years: 8.00    Types: Cigarettes  . Smokeless tobacco: Never Used  . Alcohol use Yes     Comment: last drink 24 days  . Drug use: Yes    Types: Marijuana, "Crack" cocaine, Cocaine     Comment: last used 09/03/2014  . Sexual activity: Not Asked   Other Topics Concern  . None   Social History Narrative   **  Merged History Encounter **       Additional Social History:   Sleep: Fair  Appetite:  Fair  Current Medications: Current Facility-Administered Medications  Medication Dose Route Frequency Provider Last Rate Last Dose  . acetaminophen (TYLENOL) tablet 650 mg  650 mg Oral Q6H PRN Laveda AbbeParks, Laurie Britton, NP   650 mg at 11/27/16 [id number]  . albuterol (PROVENTIL HFA;VENTOLIN HFA) 108 (90 Base) MCG/ACT inhaler 2 puff  2 puff Inhalation Q6H PRN Laveda AbbeParks, Laurie Britton, NP      . alum & mag hydroxide-simeth (MAALOX/MYLANTA) 200-200-20 MG/5ML suspension 30 mL  30 mL Oral Q4H PRN Laveda AbbeParks, Laurie Britton, NP      . ARIPiprazole (ABILIFY) tablet 7.5 mg  7.5 mg Oral BID Laveda AbbeParks, Laurie Britton, NP   7.5 mg at 12/02/16 0753  . canagliflozin [facility](INVOKANA) tablet 300 mg  300 mg Oral Daily Laveda AbbeParks, Laurie Britton, NP   300 mg at 12/02/16 [id number]  . citalopram (CELEXA) tablet 10 mg  10 mg Oral Daily Cobos, Rockey SituFernando A, MD   10 mg at 12/02/16 0752  . cloNIDine (CATAPRES) tablet 0.2 mg  0.2 mg Oral BID Laveda AbbeParks, Laurie Britton, NP   0.2 mg at 12/02/16 0753  . fluticasone (FLONASE) 50 MCG/ACT nasal spray 1 spray  1 spray Each Nare BID Armandina StammerNwoko, Agnes I, NP   1 spray at 12/02/16 0754  . gabapentin (NEURONTIN) capsule 300 mg  300 mg Oral TID Cobos, Rockey SituFernando A, MD   300 mg at 12/02/16 [id number]  . hydrOXYzine (ATARAX/VISTARIL) tablet 50 mg  50 mg Oral TID PRN Oneta RackLewis, Tanika N, NP   50 mg at 12/01/16 2219  . ibuprofen (ADVIL,MOTRIN) tablet 800 mg  800 mg Oral Q6H PRN Armandina StammerNwoko, Agnes I, NP   800 mg at 12/02/16 0753  . insulin aspart (novoLOG) injection 0-15 Units  0-15 Units Subcutaneous TID WC Simon, Spencer E, PA-C   Stopped at 12/02/16 0700  . insulin aspart (novoLOG) injection 0-5 Units  0-5 Units Subcutaneous QHS Kerry HoughSimon, Spencer E, PA-C   2 Units at 11/29/16 2202  . insulin glargine (LANTUS) injection 60 Units  60 Units Subcutaneous QHS Laveda AbbeParks, Laurie Britton, NP   60 Units at 12/01/16 2124  . magnesium hydroxide (MILK OF MAGNESIA) suspension 30  mL  30 mL Oral Daily PRN Laveda AbbeParks, Laurie Britton, NP      . nicotine (NICODERM CQ - dosed in mg/24 hours) patch 21 mg  21 mg Transdermal Daily Donell SievertSimon, Spencer E, PA-C   21 mg at 12/02/16 0753  . pantoprazole (PROTONIX) EC tablet 40 mg  40 mg Oral Daily Laveda AbbeParks, Laurie Britton, NP   40 mg at 12/02/16 [id number]  . tiotropium (SPIRIVA) inhalation capsule 18 mcg  18 mcg Inhalation Daily Laveda AbbeParks, Laurie Britton, NP   18 mcg at 12/02/16 0755  . tiZANidine (ZANAFLEX) tablet 4 mg  4 mg Oral TID Denzil Magnusonhomas, Jacky Dross, NP   4 mg at 12/02/16 0752  . traZODone (DESYREL) tablet 100 mg  100 mg Oral QHS PRN Cobos, Rockey SituFernando A, MD  100 mg at 12/01/16 2219   Lab Results:  Results for orders placed or performed during the hospital encounter of 11/26/16 (from the past 48 hour(s))  Glucose, capillary     Status: Abnormal   Collection Time: 11/30/16  4:53 PM  Result Value Ref Range   Glucose-Capillary 210 (H) 65 - 99 mg/dL   Comment 1 Notify RN    Comment 2 Document in Chart   Glucose, capillary     Status: Abnormal   Collection Time: 11/30/16  8:42 PM  Result Value Ref Range   Glucose-Capillary 167 (H) 65 - 99 mg/dL  Glucose, capillary     Status: Abnormal   Collection Time: 12/01/16  5:57 AM  Result Value Ref Range   Glucose-Capillary 119 (H) 65 - 99 mg/dL  T4, free     Status: None   Collection Time: 12/01/16  6:10 AM  Result Value Ref Range   Free T4 0.66 0.61 - 1.12 ng/dL    Comment: (NOTE) Biotin ingestion may interfere with free T4 tests. If the results are inconsistent with the TSH level, previous test results, or the clinical presentation, then consider biotin interference. If needed, order repeat testing after stopping biotin. Performed at [facility], [address] [place], [place] [address]   Glucose, capillary     Status: Abnormal   Collection Time: 12/01/16 11:59 AM  Result Value Ref Range   Glucose-Capillary 170 (H) 65 - 99 mg/dL  Glucose, capillary     Status: Abnormal   Collection Time:  12/01/16  5:01 PM  Result Value Ref Range   Glucose-Capillary 142 (H) 65 - 99 mg/dL  Glucose, capillary     Status: Abnormal   Collection Time: 12/01/16  8:54 PM  Result Value Ref Range   Glucose-Capillary 172 (H) 65 - 99 mg/dL  Glucose, capillary     Status: Abnormal   Collection Time: 12/02/16  5:52 AM  Result Value Ref Range   Glucose-Capillary 108 (H) 65 - 99 mg/dL   Blood Alcohol level:  Lab Results  Component Value Date   ETH 71 (H) 05/15/2016   ETH <5 08/08/2015   Metabolic Disorder Labs: Lab Results  Component Value Date   HGBA1C 7.0 (H) 11/27/2016   MPG 154 11/27/2016   MPG 203 05/17/2016   Lab Results  Component Value Date   PROLACTIN 10.8 05/19/2016   Lab Results  Component Value Date   CHOL 202 (H) 05/17/2016   TRIG 211 (H) 05/17/2016   HDL 38 (L) 05/17/2016   CHOLHDL 5.3 05/17/2016   VLDL 42 (H) 05/17/2016   LDLCALC 122 (H) 05/17/2016   Physical Findings: AIMS: Facial and Oral Movements Muscles of Facial Expression: None, normal Lips and Perioral Area: None, normal Jaw: None, normal Tongue: None, normal,Extremity Movements Upper (arms, wrists, hands, fingers): None, normal Lower (legs, knees, ankles, toes): None, normal, Trunk Movements Neck, shoulders, hips: None, normal, Overall Severity Severity of abnormal movements (highest score from questions above): None, normal Incapacitation due to abnormal movements: None, normal Patient's awareness of abnormal movements (rate only patient's report): No Awareness, Dental Status Current problems with teeth and/or dentures?: No Does patient usually wear dentures?: No  CIWA:    COWS:     Musculoskeletal: Strength & Muscle Tone: within normal limits Gait & Station: normal, Currently uses wheel chair to aid his mobility, has frctured right toe. Wears a right foot brace. Patient leans: N/A  Psychiatric Specialty Exam: Physical Exam  Nursing note and vitals reviewed.  Constitutional: He is oriented to  person, place, and time. He appears well-developed.  Cardiovascular: Normal rate.   Musculoskeletal: He exhibits tenderness.  Wear a right foot brace due to fractured toe.  Neurological: He is alert and oriented to person, place, and time.  Patient reports broken/frx toes  Skin: Skin is warm.  Psychiatric: He has a normal mood and affect. His behavior is normal.    Review of Systems  Musculoskeletal: Positive for back pain.  Psychiatric/Behavioral: Positive for depression. Negative for hallucinations, memory loss, substance abuse and suicidal ideas. The patient is nervous/anxious and has insomnia.   All other systems reviewed and are negative.   Blood pressure (!) 127/97, pulse 100, temperature 97.9 F (36.6 C), temperature source Oral, resp. rate 16, height 5\' 9"  (1.753 m), weight 189 lb (85.7 kg).Body mass index is 27.91 kg/m.  General Appearance: Fairly Groomed, wheelchair at bedside  Eye Contact:  Good  Speech:  Slow  Volume:  Decreased  Mood:  continues to endorse improvement in mood.   Affect:  Appropriate  Thought Process:  Coherent and Linear  Orientation:  Full (Time, Place, and Person)  Thought Content:  Logical, no Preoccupations or ruminations   Suicidal Thoughts:  Denies any thoughts, plans or intent.  Homicidal Thoughts:  Denies any thoughts, plans or intent.  Memory:  Immediate;   Good Recent;   Good Remote;   Good  Judgement:  Good  Insight:  Present  Psychomotor Activity:  Normal  Concentration:  Concentration: Good and Attention Span: Good  Recall:  Good  Fund of Knowledge:  Good  Language:  Good  Akathisia:  Negative  Handed:  Right  AIMS (if indicated):     Assets:  Communication Skills Desire for Improvement Resilience  ADL's:  Intact  Cognition:  WNL  Sleep:  Number of Hours: 6.5   Treatment Plan Summary: Daily contact with patient to assess and evaluate symptoms and progress in treatment   Notes some improvement in mood. Continue with current  treatment plan on 12/02/2016, with no adjustmets;  Continue with Celexa 10 mg for depression Continue with Neurontin 300 mg for agitation/pain management. Continue Abilify 7.5mg  for mood control. Continue with Trazodone 100 mg for insomnia  Continue to monitor vitals, medication compliance and treatment side effects while patient is here.  Reviewed labs. No new labs resulted.  CSW will start working on disposition.  Patient to participate in therapeutic milieu  Denzil Magnuson, NP, PMHNP, FNP-BC. 12/02/2016, 12:03 PM   Patient ID: Billy [name], male   DOB: 1961-02-20, 56 y.o.   MRN: [id number]

## 2016-12-02 NOTE — Progress Notes (Signed)
Recreation Therapy Notes  Animal-Assisted Activity (AAA) Program Checklist/Progress Notes Patient Eligibility Criteria Checklist & Daily Group note for Rec TxIntervention  Date: 07.03.2018 Time: 3:00pm [address]    AAA/T Program Assumption of Risk Form signed by Patient/ or Parent Legal Guardian Yes  Patient is free of allergies or sever asthma Yes  Patient reports no fear of animals Yes  Patient reports no history of cruelty to animals Yes  Patient understands his/her participation is voluntary Yes  Patient washes hands before animal contact Yes  Patient washes hands after animal contact Yes  Behavioral Response: Appropriate   Education:Hand Washing, Appropriate Animal Interaction   Education Outcome: Acknowledges education.   Clinical Observations/Feedback: Patient attended session and interacted appropriately with therapy dog and peers.   Marykay Lexenise L Leatrice Parilla, LRT/CTRS        Philipp Callegari L 12/02/2016 3:13 PM

## 2016-12-03 ENCOUNTER — Encounter (HOSPITAL_COMMUNITY): Payer: Self-pay | Admitting: Behavioral Health

## 2016-12-03 LAB — GLUCOSE, CAPILLARY
GLUCOSE-CAPILLARY: 109 mg/dL — AB (ref 65–99)
GLUCOSE-CAPILLARY: 102 mg/dL — AB (ref 65–99)
GLUCOSE-CAPILLARY: 155 mg/dL — AB (ref 65–99)
Glucose-Capillary: 62 mg/dL — ABNORMAL LOW (ref 65–99)
Glucose-Capillary: 174 mg/dL — ABNORMAL HIGH (ref 65–99)

## 2016-12-03 MED ORDER — CLOTRIMAZOLE 1 % EX CREA
TOPICAL_CREAM | Freq: Two times a day (BID) | CUTANEOUS | Status: DC
  Administered 2016-12-03 – 2016-12-04 (×2): via TOPICAL
  Filled 2016-12-03: qty 15

## 2016-12-03 NOTE — Progress Notes (Signed)
Recreation Therapy Notes  Date: 12/03/16 Time: 0930 Location: [address] Group Room  Group Topic: Stress Management  Goal Area(s) Addresses:  Patient will verbalize importance of using healthy stress management.  Patient will identify positive emotions associated with healthy stress management.   Intervention: Stress Management  Activity :  Meditation.  LRT introduced the stress management technique of meditation.  LRT played meditation from the Calm app which focused on gratitude.  Patients were to follow along as the meditation played in order to fully engage in the activity.  Education:  Stress Management, Discharge Planning.   Education Outcome: Acknowledges edcuation/In group clarification offered/Needs additional education  Clinical Observations/Feedback:  Pt did not attend group.   Caroll RancherMarjette Unice Vantassel, LRT/CTRS         Caroll RancherLindsay, Koree Staheli A 12/03/2016 12:03 PM

## 2016-12-03 NOTE — Tx Team (Signed)
Interdisciplinary Treatment and Diagnostic Plan Update 12/03/2016 Time of Session: 9:30am  Billy [name]  MRN: [id number]  Principal Diagnosis: MDD (major depressive disorder), recurrent severe, without psychosis (HCC)  Secondary Diagnoses: Principal Problem:   MDD (major depressive disorder), recurrent severe, without psychosis (HCC) Active Problems:   Suicide attempt (HCC)   Current Medications:  Current Facility-Administered Medications  Medication Dose Route Frequency Provider Last Rate Last Dose  . acetaminophen (TYLENOL) tablet 650 mg  650 mg Oral Q6H PRN Laveda Abbe, NP   650 mg at 11/27/16 4098  . albuterol (PROVENTIL HFA;VENTOLIN HFA) 108 (90 Base) MCG/ACT inhaler 2 puff  2 puff Inhalation Q6H PRN Laveda Abbe, NP      . alum & mag hydroxide-simeth (MAALOX/MYLANTA) 200-200-20 MG/5ML suspension 30 mL  30 mL Oral Q4H PRN Laveda Abbe, NP      . ARIPiprazole (ABILIFY) tablet 7.5 mg  7.5 mg Oral BID Laveda Abbe, NP   7.5 mg at 12/03/16 0800  . canagliflozin [facility]) tablet 300 mg  300 mg Oral Daily Laveda Abbe, NP   300 mg at 12/03/16 0801  . citalopram (CELEXA) tablet 20 mg  20 mg Oral Daily Nelly Rout, MD   20 mg at 12/03/16 0801  . cloNIDine (CATAPRES) tablet 0.2 mg  0.2 mg Oral BID Laveda Abbe, NP   0.2 mg at 12/03/16 0802  . clotrimazole (LOTRIMIN) 1 % cream   Topical BID Denzil Magnuson, NP      . fluticasone [facility]) 50 MCG/ACT nasal spray 1 spray  1 spray Each Nare BID Armandina Stammer I, NP   1 spray at 12/03/16 0801  . gabapentin (NEURONTIN) capsule 300 mg  300 mg Oral TID Cobos, Rockey Situ, MD   300 mg at 12/03/16 1154  . hydrOXYzine (ATARAX/VISTARIL) tablet 50 mg  50 mg Oral TID PRN Oneta Rack, NP   50 mg at 12/03/16 0955  . ibuprofen (ADVIL,MOTRIN) tablet 800 mg  800 mg Oral Q6H PRN Armandina Stammer I, NP   800 mg at 12/03/16 1191  . insulin aspart (novoLOG) injection 0-15 Units  0-15 Units Subcutaneous TID  WC Donell Sievert E, PA-C   2 Units at 12/02/16 1726  . insulin aspart (novoLOG) injection 0-5 Units  0-5 Units Subcutaneous QHS Kerry Hough, PA-C   2 Units at 11/29/16 2202  . insulin glargine (LANTUS) injection 60 Units  60 Units Subcutaneous QHS Laveda Abbe, NP   60 Units at 12/02/16 2132  . magnesium hydroxide (MILK OF MAGNESIA) suspension 30 mL  30 mL Oral Daily PRN Laveda Abbe, NP   30 mL at 12/02/16 1824  . nicotine (NICODERM CQ - dosed in mg/24 hours) patch 21 mg  21 mg Transdermal Daily Donell Sievert E, PA-C   21 mg at 12/03/16 0804  . pantoprazole (PROTONIX) EC tablet 40 mg  40 mg Oral Daily Laveda Abbe, NP   40 mg at 12/03/16 0802  . tiotropium (SPIRIVA) inhalation capsule 18 mcg  18 mcg Inhalation Daily Laveda Abbe, NP   18 mcg at 12/03/16 4782  . tiZANidine (ZANAFLEX) tablet 4 mg  4 mg Oral TID Denzil Magnuson, NP   4 mg at 12/03/16 1154  . traZODone (DESYREL) tablet 100 mg  100 mg Oral QHS PRN Cobos, Rockey Situ, MD   100 mg at 12/02/16 2325    PTA Medications: Prescriptions Prior to Admission  Medication Sig Dispense Refill Last Dose  . albuterol (PROAIR HFA)  108 (90 Base) MCG/ACT inhaler Inhale 2 puffs into the lungs every 6 (six) hours as needed for shortness of breath. 1 Inhaler 0 11/24/2016 at Unknown time  . ARIPiprazole (ABILIFY) 15 MG tablet Take 0.5 tablets (7.5 mg total) by mouth 2 (two) times daily. For mood control (Patient not taking: Reported on 11/24/2016) 60 tablet 0 Not Taking at Unknown time  . citalopram (CELEXA) 20 MG tablet Take 1 tablet (20 mg total) by mouth daily. For depression (Patient not taking: Reported on 11/24/2016) 30 tablet 0 Not Taking at Unknown time  . gabapentin (NEURONTIN) 400 MG capsule Take 3 capsules (1,200 mg total) by mouth 3 (three) times daily. For agitation (Patient not taking: Reported on 11/24/2016) 270 capsule 0 Not Taking at Unknown time  . hydrOXYzine (ATARAX/VISTARIL) 50 MG tablet Take 1  tablet (50 mg total) by mouth at bedtime and may repeat dose one time if needed. For anxiety/insomnia 60 tablet 0 11/23/2016 at Unknown time  . insulin glargine (LANTUS) 100 UNIT/ML injection Inject 0.6 mLs (60 Units total) into the skin at bedtime. For diabetes managment 10 mL 0 Past Week at Unknown time  . INVOKANA 300 MG TABS tablet Take 1 tablet (300 mg total) by mouth daily. For diabetes management (Patient not taking: Reported on 11/24/2016) 1 tablet 0 Not Taking at Unknown time  . lisinopril (PRINIVIL,ZESTRIL) 20 MG tablet Take 1 tablet (20 mg total) by mouth daily. For high blood pressure (Patient not taking: Reported on 11/24/2016) 1 tablet 0 Not Taking at Unknown time  . nicotine (NICODERM CQ - DOSED IN MG/24 HOURS) 21 mg/24hr patch Place 1 patch (21 mg total) onto the skin daily at 6 (six) AM. For smoking cessation (Patient not taking: Reported on 11/24/2016) 28 patch 0 Not Taking at Unknown time  . omeprazole (PRILOSEC) 20 MG capsule Take 2 capsules (40 mg total) by mouth daily. For acid reflux   11/23/2016 at Unknown time  . oxymetazoline (AFRIN) 0.05 % nasal spray Place 1 spray into both nostrils 2 (two) times daily as needed for congestion. (Patient not taking: Reported on 11/24/2016) 30 mL 0 Not Taking at Unknown time  . tiotropium (SPIRIVA) 18 MCG inhalation capsule Place 1 capsule (18 mcg total) into inhaler and inhale daily. For COPD 30 capsule 12 11/24/2016 at Unknown time  . tiZANidine (ZANAFLEX) 2 MG tablet Take 2 tablets (4 mg total) by mouth 3 (three) times daily. For pain 1 tablet 0 11/23/2016 at Unknown time  . traZODone (DESYREL) 100 MG tablet Take 2 tablets (200 mg total) by mouth at bedtime. For sleep 60 tablet 0 11/23/2016 at Unknown time  . XARELTO 20 MG TABS tablet Take 1 tablet (20 mg total) by mouth daily. For prevention of blood clot (Patient not taking: Reported on 11/24/2016) 1 tablet 0 Not Taking at Unknown time    Treatment Modalities: Medication Management, Group therapy,  Case management,  1 to 1 session with clinician, Psychoeducation, Recreational therapy.  Patient Stressors: Substance abuse Other: Being alone Patient Strengths: Average or above average intelligence Capable of independent living Motivation for treatment/growth  Physician Treatment Plan for Primary Diagnosis: MDD (major depressive disorder), recurrent severe, without psychosis (HCC) Long Term Goal(s): Improvement in symptoms so as ready for discharge Short Term Goals: Ability to identify changes in lifestyle to reduce recurrence of condition will improve Ability to verbalize feelings will improve Ability to identify triggers associated with substance abuse/mental health issues will improve Ability to disclose and discuss suicidal ideas Ability to identify  and develop effective coping behaviors will improve  Medication Management: Evaluate patient's response, side effects, and tolerance of medication regimen.  Therapeutic Interventions: 1 to 1 sessions, Unit Group sessions and Medication administration.  Evaluation of Outcomes: Adequate for Discharge  Physician Treatment Plan for Secondary Diagnosis: Principal Problem:   MDD (major depressive disorder), recurrent severe, without psychosis (HCC) Active Problems:   Suicide attempt (HCC)  Long Term Goal(s): Improvement in symptoms so as ready for discharge  Short Term Goals: Ability to identify changes in lifestyle to reduce recurrence of condition will improve Ability to verbalize feelings will improve Ability to identify triggers associated with substance abuse/mental health issues will improve Ability to disclose and discuss suicidal ideas Ability to identify and develop effective coping behaviors will improve  Medication Management: Evaluate patient's response, side effects, and tolerance of medication regimen.  Therapeutic Interventions: 1 to 1 sessions, Unit Group sessions and Medication administration.  Evaluation of  Outcomes: Adequate for Discharge  RN Treatment Plan for Primary Diagnosis: MDD (major depressive disorder), recurrent severe, without psychosis (HCC) Long Term Goal(s): Knowledge of disease and therapeutic regimen to maintain health will improve  Short Term Goals: Compliance with prescribed medications will improve  Medication Management: RN will administer medications as ordered by provider, will assess and evaluate patient's response and provide education to patient for prescribed medication. RN will report any adverse and/or side effects to prescribing provider.  Therapeutic Interventions: 1 on 1 counseling sessions, Psychoeducation, Medication administration, Evaluate responses to treatment, Monitor vital signs and CBGs as ordered, Perform/monitor CIWA, COWS, AIMS and Fall Risk screenings as ordered, Perform wound care treatments as ordered.  Evaluation of Outcomes: Adequate for Discharge  LCSW Treatment Plan for Primary Diagnosis: MDD (major depressive disorder), recurrent severe, without psychosis (HCC) Long Term Goal(s): Safe transition to appropriate next level of care at discharge, Engage patient in therapeutic group addressing interpersonal concerns. Short Term Goals: Engage patient in aftercare planning with referrals and resources  Therapeutic Interventions: Assess for all discharge needs, 1 to 1 time with Social worker, Explore available resources and support systems, Assess for adequacy in community support network, Educate family and significant other(s) on suicide prevention, Complete Psychosocial Assessment, Interpersonal group therapy.  Evaluation of Outcomes: Adequate for Discharge  Progress in Treatment: Attending groups: Intermittently  Participating in groups: Yes Taking medication as prescribed: Yes, MD continues to assess for medication changes as needed Toleration medication: Yes, no side effects reported at this time Family/Significant other contact made: No, Pt  declines  Patient understands diagnosis: Developing insight Discussing patient identified problems/goals with staff: Yes Medical problems stabilized or resolved: Yes Denies suicidal/homicidal ideation: Yes Issues/concerns per patient self-inventory: None Other: N/A  New problem(s) identified: None identified at this time.   New Short Term/Long Term Goal(s): None identified at this time.   Discharge Plan or Barriers: Pt will return home and follow up with [facility].  Reason for Continuation of Hospitalization: None identified at this time.   Estimated Length of Stay: 1 day; Estimated discharge date 12/04/16  Attendees: Patient: 12/03/2016 12:22 PM  Physician: Dr. Guss BundeIzeduino, MD 12/03/2016 12:22 PM  Nursing: Cala BradfordKimberly, RN; CovePatrice, RN 12/03/2016 12:22 PM  RN Care Manager: 12/03/2016 12:22 PM  Social Worker: Donnelly StagerLynn Bryant, LCSWA 12/03/2016 12:22 PM  Recreational Therapist:  12/03/2016 12:22 PM  Other: Armandina StammerAgnes Nwoko, NP; Tarri FullerLashunda, Conrad, NP 12/03/2016 12:22 PM  Other:  12/03/2016 12:22 PM  Other: 12/03/2016 12:22 PM   Scribe for Treatment Team: Vernie ShanksLauren Vladislav Axelson, LCSW Clinical Social Work [phone]

## 2016-12-03 NOTE — Plan of Care (Signed)
Problem: Activity: Goal: Interest or engagement in activities will improve Outcome: Progressing Pt seen in dayroom at times this evening, but still isolates at times

## 2016-12-03 NOTE — [facility] Group Notes (Signed)
[facility]   12/03/2016  8:45 AM  Participation Quality:  Active  Mood/Affect:  Appropriate  Thoughts of Suicide:  No  Current AVH:  No  Plan for Discharge/Comments: Pt states that he is "feeling good" today. Pt reports that he is in an upbeat mood and is looking forward to discharge tomorrow. Pt also mentioned some sleep concerns, stating that he has been having problems staying asleep at night. Pt reports that he got about 6-7 hours of sleep last night.  Transportation Means: Pt has access to transporation  Supports: Mental health providers   Billy [name], MSW, [facility] IncCSWA 12/03/2016 9:53 AM

## 2016-12-03 NOTE — [facility] Suicide Risk Assessment (Signed)
[facility] INPATIENT:  Family/Significant Other Suicide Prevention Education  Suicide Prevention Education:  Patient Refusal for Family/Significant Other Suicide Prevention Education: The patient Billy [name] has refused to provide written consent for family/significant other to be provided Family/Significant Other Suicide Prevention Education during admission and/or prior to discharge.  Physician notified.  Pt states that he has no one that can be contacted at this time. SPE discussed with pt.  Jonathon JordanLynn B Jacquelina Hewins, MSW, LCSWA 12/03/2016, 8:34 AM

## 2016-12-03 NOTE — Progress Notes (Signed)
D: Pt denies SI/HI/AVH. Pt is pleasant and cooperative. Pt stated he was doing better, hopefully he said he was going to be feeling well enough to go home.   A: Pt was offered support and encouragement. Pt was given scheduled medications. Pt was encourage to attend groups. Q 15 minute checks were done for safety.   R: Pt is taking medication. Pt has no complaints.Pt receptive to treatment and safety maintained on unit.

## 2016-12-03 NOTE — Progress Notes (Signed)
Hypoglycemic Event  CBG: 62  Treatment: 15 GM carbohydrate snack  Symptoms: None  Follow-up CBG: Time:625 CBG Result: 109  Possible Reasons for Event: Unknown  Comments/MD notified:continue to monitor    Delos HaringPhillips, Ethylene Reznick A

## 2016-12-03 NOTE — Progress Notes (Signed)
Pt rated his day a 8. Pt goal for tomorrow is to continue to take medication as schedule.

## 2016-12-03 NOTE — Progress Notes (Signed)
Patient ID: Maudie FlakesMelvin K Hypes, male   DOB: September 04, 1960, 56 y.o.   MRN: [id number]  D: Patient denies SI/HI and auditory and visual hallucinations. Patient has a depressed mood and affect.Patient has complained of anxiety this am. Very talkative with this Clinical research associatewriter. States he feels better.  A: Patient given emotional support from RN. Patient given medications per MD orders. Patient encouraged to attend groups and unit activities. Patient encouraged to come to staff with any questions or concerns.  R: Patient remains cooperative and appropriate. Will continue to monitor patient for safety.

## 2016-12-03 NOTE — Progress Notes (Signed)
[facility]  12/03/2016 10:33 AM Billy FlakesMelvin K [name]  MRN:  [id number]  Subjective:  Billy [name] reports "Doing just fine. Hoping to be discharged tomorrow. Was wondering if I could get something for foot fungusl."  Objective: Billy [name] is seen, chart reviewed. During this evaluation, patient is  awake, alert and oriented x4, calm and cooperative. Patients continues to show improvement in his psychiatric condition. He notes overall improvement in depressive symptoms as well as anxiety. He continues to deny any withdrawal symptoms and none are observed. He continues to endorse improvement in toe pain yet does endorse some foot fungus. No behavioral problems have been reported or observed. He denies suicidal or homicidal ideation during this assessment. Denies auditory or visual hallucination and does not appear to be responding to internal stimuli. Endorses fair appetite and sleeping pattern.  Remains complaint with medications reporting they are well tolerated and without side effects.Support, encouragement and reassurance was provided. He is able to contract for safety at this time.    Principal Problem: MDD (major depressive disorder), recurrent severe, without psychosis (HCC) Diagnosis:   Patient Active Problem List   Diagnosis Date Noted  . MDD (major depressive disorder), recurrent severe, without psychosis (HCC) [F33.2] 11/26/2016  . Suicide attempt (HCC) [T14.91XA] 11/25/2016  . Suicidal ideation [R45.851]   . Bipolar disorder, curr episode mixed, severe, w/o psychotic features (HCC) [F31.63] 05/17/2016  . Cocaine use disorder, severe, dependence (HCC) [F14.20] 05/17/2016  . Affective psychosis, bipolar (HCC) [F31.9]   . Bipolar affective disorder, depressed (HCC) [F31.30] 08/09/2015  . Bipolar disorder with current episode depressed (HCC) [F31.30] 08/09/2015  . Polysubstance abuse [F19.10]   . Type 2 diabetes mellitus with hyperglycemia (HCC) [E11.65] 09/19/2014  . Right leg DVT (HCC)  [I82.401] 09/19/2014  . Essential hypertension [I10] 09/19/2014  . Bipolar affective disorder, depressed, severe, with psychotic behavior (HCC) [F31.5] 09/14/2014  . Chronic pain syndrome [G89.4] 09/14/2014  . Cocaine abuse [F14.10] 09/13/2014  . Alcohol use disorder, mild, abuse [F10.10] 10/12/2012  . Homeless [Z59.0] 09/02/2011  . Opiate abuse, episodic [F11.10] 09/02/2011  . Diabetes mellitus [250] 08/30/2011  . WPW (Wolff-Parkinson-White syndrome) [I45.6] 08/30/2011   Total Time spent with patient: 20 minutes  Past Psychiatric History: Bipolar disorder, MDD, anxiety, multiple SA, multiple psychiatric admissions.   Past Medical History:  Past Medical History:  Diagnosis Date  . Bipolar 1 disorder (HCC)   . Chronic pain   . Depression   . Diabetes mellitus   . Diabetes mellitus without complication (HCC)   . HCV (hepatitis C virus)   . Hepatitis C carrier (HCC)   . Homeless   . Hypertension   . Suicide attempt (HCC)   . WPW (Wolff-Parkinson-White syndrome)     Past Surgical History:  Procedure Laterality Date  . BACK SURGERY    . CHEST SURGERY    . RHINOPLASTY    . TONSILLECTOMY     Family History:  Family History  Problem Relation Age of Onset  . Mental illness Neg Hx    Family Psychiatric  History: See HPI   Social History:  History  Alcohol Use  . Yes    Comment: last drink 24 days     History  Drug Use  . Types: Marijuana, "Crack" cocaine, Cocaine    Comment: last used 09/03/2014    Social History   Social History  . Marital status: Divorced    Spouse name: N/A  . Number of children: N/A  . Years of education: N/A  Social History Main Topics  . Smoking status: Current Every Day Smoker    Packs/day: 1.00    Years: 8.00    Types: Cigarettes  . Smokeless tobacco: Never Used  . Alcohol use Yes     Comment: last drink 24 days  . Drug use: Yes    Types: Marijuana, "Crack" cocaine, Cocaine     Comment: last used 09/03/2014  . Sexual activity: Not  Asked   Other Topics Concern  . None   Social History Narrative   ** Merged History Encounter **       Additional Social History:   Sleep: Fair  Appetite:  Fair  Current Medications: Current Facility-Administered Medications  Medication Dose Route Frequency Provider Last Rate Last Dose  . acetaminophen (TYLENOL) tablet 650 mg  650 mg Oral Q6H PRN Laveda Abbe, NP   650 mg at 11/27/16 6578  . albuterol (PROVENTIL HFA;VENTOLIN HFA) 108 (90 Base) MCG/ACT inhaler 2 puff  2 puff Inhalation Q6H PRN Laveda Abbe, NP      . alum & mag hydroxide-simeth (MAALOX/MYLANTA) 200-200-20 MG/5ML suspension 30 mL  30 mL Oral Q4H PRN Laveda Abbe, NP      . ARIPiprazole (ABILIFY) tablet 7.5 mg  7.5 mg Oral BID Laveda Abbe, NP   7.5 mg at 12/03/16 0800  . canagliflozin [facility]) tablet 300 mg  300 mg Oral Daily Laveda Abbe, NP   300 mg at 12/03/16 0801  . citalopram (CELEXA) tablet 20 mg  20 mg Oral Daily Nelly Rout, MD   20 mg at 12/03/16 0801  . cloNIDine (CATAPRES) tablet 0.2 mg  0.2 mg Oral BID Laveda Abbe, NP   0.2 mg at 12/03/16 0802  . fluticasone (FLONASE) 50 MCG/ACT nasal spray 1 spray  1 spray Each Nare BID Armandina Stammer I, NP   1 spray at 12/03/16 0801  . gabapentin (NEURONTIN) capsule 300 mg  300 mg Oral TID Cobos, Rockey Situ, MD   300 mg at 12/03/16 0801  . hydrOXYzine (ATARAX/VISTARIL) tablet 50 mg  50 mg Oral TID PRN Oneta Rack, NP   50 mg at 12/03/16 0955  . ibuprofen (ADVIL,MOTRIN) tablet 800 mg  800 mg Oral Q6H PRN Armandina Stammer I, NP   800 mg at 12/03/16 4696  . insulin aspart (novoLOG) injection 0-15 Units  0-15 Units Subcutaneous TID WC Donell Sievert E, PA-C   2 Units at 12/02/16 1726  . insulin aspart (novoLOG) injection 0-5 Units  0-5 Units Subcutaneous QHS Kerry Hough, PA-C   2 Units at 11/29/16 2202  . insulin glargine (LANTUS) injection 60 Units  60 Units Subcutaneous QHS Laveda Abbe, NP   60 Units at  12/02/16 2132  . magnesium hydroxide (MILK OF MAGNESIA) suspension 30 mL  30 mL Oral Daily PRN Laveda Abbe, NP   30 mL at 12/02/16 1824  . nicotine (NICODERM CQ - dosed in mg/24 hours) patch 21 mg  21 mg Transdermal Daily Donell Sievert E, PA-C   21 mg at 12/03/16 0804  . pantoprazole (PROTONIX) EC tablet 40 mg  40 mg Oral Daily Laveda Abbe, NP   40 mg at 12/03/16 0802  . tiotropium (SPIRIVA) inhalation capsule 18 mcg  18 mcg Inhalation Daily Laveda Abbe, NP   18 mcg at 12/03/16 2952  . tiZANidine (ZANAFLEX) tablet 4 mg  4 mg Oral TID Denzil Magnuson, NP   4 mg at 12/03/16 0802  . traZODone (DESYREL) tablet 100  mg  100 mg Oral QHS PRN Cobos, Rockey Situ, MD   100 mg at 12/02/16 2325   Lab Results:  Results for orders placed or performed during the hospital encounter of 11/26/16 (from the past 48 hour(s))  Glucose, capillary     Status: Abnormal   Collection Time: 12/01/16 11:59 AM  Result Value Ref Range   Glucose-Capillary 170 (H) 65 - 99 mg/dL  Glucose, capillary     Status: Abnormal   Collection Time: 12/01/16  5:01 PM  Result Value Ref Range   Glucose-Capillary 142 (H) 65 - 99 mg/dL  Glucose, capillary     Status: Abnormal   Collection Time: 12/01/16  8:54 PM  Result Value Ref Range   Glucose-Capillary 172 (H) 65 - 99 mg/dL  Glucose, capillary     Status: Abnormal   Collection Time: 12/02/16  5:52 AM  Result Value Ref Range   Glucose-Capillary 108 (H) 65 - 99 mg/dL  Glucose, capillary     Status: Abnormal   Collection Time: 12/02/16 12:02 PM  Result Value Ref Range   Glucose-Capillary 145 (H) 65 - 99 mg/dL   Comment 1 Notify RN    Comment 2 Document in Chart   Glucose, capillary     Status: Abnormal   Collection Time: 12/02/16  5:11 PM  Result Value Ref Range   Glucose-Capillary 144 (H) 65 - 99 mg/dL   Comment 1 Notify RN    Comment 2 Document in Chart   Glucose, capillary     Status: Abnormal   Collection Time: 12/02/16  8:23 PM  Result Value  Ref Range   Glucose-Capillary 172 (H) 65 - 99 mg/dL  Glucose, capillary     Status: Abnormal   Collection Time: 12/03/16  6:09 AM  Result Value Ref Range   Glucose-Capillary 62 (L) 65 - 99 mg/dL   Comment 1 Notify RN   Glucose, capillary     Status: Abnormal   Collection Time: 12/03/16  6:26 AM  Result Value Ref Range   Glucose-Capillary 109 (H) 65 - 99 mg/dL   Comment 1 Notify RN    Blood Alcohol level:  Lab Results  Component Value Date   ETH 71 (H) 05/15/2016   ETH <5 08/08/2015   Metabolic Disorder Labs: Lab Results  Component Value Date   HGBA1C 7.0 (H) 11/27/2016   MPG 154 11/27/2016   MPG 203 05/17/2016   Lab Results  Component Value Date   PROLACTIN 10.8 05/19/2016   Lab Results  Component Value Date   CHOL 202 (H) 05/17/2016   TRIG 211 (H) 05/17/2016   HDL 38 (L) 05/17/2016   CHOLHDL 5.3 05/17/2016   VLDL 42 (H) 05/17/2016   LDLCALC 122 (H) 05/17/2016   Physical Findings: AIMS: Facial and Oral Movements Muscles of Facial Expression: None, normal Lips and Perioral Area: None, normal Jaw: None, normal Tongue: None, normal,Extremity Movements Upper (arms, wrists, hands, fingers): None, normal Lower (legs, knees, ankles, toes): None, normal, Trunk Movements Neck, shoulders, hips: None, normal, Overall Severity Severity of abnormal movements (highest score from questions above): None, normal Incapacitation due to abnormal movements: None, normal Patient's awareness of abnormal movements (rate only patient's report): No Awareness, Dental Status Current problems with teeth and/or dentures?: No Does patient usually wear dentures?: No  CIWA:    COWS:     Musculoskeletal: Strength & Muscle Tone: within normal limits Gait & Station: normal, Currently uses wheel chair to aid his mobility, has frctured right toe. Wears a right  foot brace. Patient leans: N/A  Psychiatric Specialty Exam: Physical Exam  Nursing note and vitals reviewed. Constitutional: He is  oriented to person, place, and time. He appears well-developed.  Cardiovascular: Normal rate.   Musculoskeletal: He exhibits tenderness.  Wear a right foot brace due to fractured toe.  Neurological: He is alert and oriented to person, place, and time.  Patient reports broken/frx toes  Skin: Skin is warm.  Psychiatric: He has a normal mood and affect. His behavior is normal.    Review of Systems  Psychiatric/Behavioral: Positive for depression. Negative for hallucinations, memory loss, substance abuse and suicidal ideas. The patient is nervous/anxious and has insomnia.   All other systems reviewed and are negative.   Blood pressure (!) 147/98, pulse 95, temperature 98.1 F (36.7 C), temperature source Oral, resp. rate 18, height 5\' 9"  (1.753 m), weight 189 lb (85.7 kg).Body mass index is 27.91 kg/m.  General Appearance: Fairly Groomed, wheelchair at bedside  Eye Contact:  Good  Speech:  Slow  Volume:  Decreased  Mood:  " better"  Affect:  Appropriate  Thought Process:  Coherent and Linear  Orientation:  Full (Time, Place, and Person)  Thought Content:  Logical, no Preoccupations or ruminations   Suicidal Thoughts:  Denies any thoughts, plans or intent.  Homicidal Thoughts:  Denies any thoughts, plans or intent.  Memory:  Immediate;   Good Recent;   Good Remote;   Good  Judgement:  Good  Insight:  Present  Psychomotor Activity:  Normal  Concentration:  Concentration: Good and Attention Span: Good  Recall:  Good  Fund of Knowledge:  Good  Language:  Good  Akathisia:  Negative  Handed:  Right  AIMS (if indicated):     Assets:  Communication Skills Desire for Improvement Resilience  ADL's:  Intact  Cognition:  WNL  Sleep:  Number of Hours: 6.25   Treatment Plan Summary: Daily contact with patient to assess and evaluate symptoms and progress in treatment   Continues to show improvement in psychiatric symptoms/condition(s). i Continue with current treatment plan on  12/03/2016, with  adjustments as noted;  Continue with Celexa 10 mg for depression Continue with Neurontin 300 mg for agitation/pain management. Continue Abilify 7.5mg  for mood control. Continue with Trazodone 100 mg for insomnia Tinea Pedis-Start Lotrimin 1% cream topical BID for 28 days. If no improvement after course is completed, follow-up with outpatient provider for further evaluation.   Continue to monitor vitals, medication compliance and treatment side effects while patient is here.  Reviewed labs. No new labs resulted.  CSW will start working on disposition.  Patient to participate in therapeutic milieu  Denzil Magnuson, NP, PMHNP, FNP-BC. 12/03/2016, 10:33 AM  Patient ID: Billy [name], male   DOB: Oct 28, 1960, 56 y.o.   MRN: [id number]

## 2016-12-04 LAB — GLUCOSE, CAPILLARY
Glucose-Capillary: 144 mg/dL — ABNORMAL HIGH (ref 65–99)
Glucose-Capillary: 152 mg/dL — ABNORMAL HIGH (ref 65–99)

## 2016-12-04 MED ORDER — TIZANIDINE HCL 2 MG PO TABS: 4.0000 mg | ORAL_TABLET | Freq: Three times a day (TID) | ORAL | 0 refills | Status: AC

## 2016-12-04 MED ORDER — TIOTROPIUM BROMIDE MONOHYDRATE 18 MCG IN CAPS: 18.0000 ug | ORAL_CAPSULE | Freq: Every day | RESPIRATORY_TRACT | 0 refills | Status: AC

## 2016-12-04 MED ORDER — CLOTRIMAZOLE 1 % EX CREA: TOPICAL_CREAM | Freq: Two times a day (BID) | CUTANEOUS | 0 refills | Status: AC

## 2016-12-04 MED ORDER — GABAPENTIN 300 MG PO CAPS: 300.0000 mg | ORAL_CAPSULE | Freq: Three times a day (TID) | ORAL | 0 refills | Status: AC

## 2016-12-04 MED ORDER — CLONIDINE HCL 0.2 MG PO TABS: 0.2000 mg | ORAL_TABLET | Freq: Two times a day (BID) | ORAL | 0 refills | Status: AC

## 2016-12-04 MED ORDER — IBUPROFEN 800 MG PO TABS: 800.0000 mg | ORAL_TABLET | Freq: Four times a day (QID) | ORAL | 0 refills | Status: AC | PRN

## 2016-12-04 MED ORDER — INSULIN GLARGINE 100 UNIT/ML ~~LOC~~ SOLN: 60.0000 [IU] | Freq: Every day | SUBCUTANEOUS | 0 refills | Status: AC

## 2016-12-04 MED ORDER — INVOKANA 300 MG PO TABS: 300.0000 mg | ORAL_TABLET | Freq: Every day | ORAL | 0 refills | Status: AC

## 2016-12-04 MED ORDER — NICOTINE 21 MG/24HR TD PT24: 21.0000 mg | MEDICATED_PATCH | Freq: Every day | TRANSDERMAL | 0 refills | Status: AC

## 2016-12-04 MED ORDER — FLUTICASONE PROPIONATE 50 MCG/ACT NA SUSP: 1.0000 | Freq: Two times a day (BID) | NASAL | 0 refills | Status: AC

## 2016-12-04 MED ORDER — HYDROXYZINE HCL 50 MG PO TABS: 50.0000 mg | ORAL_TABLET | Freq: Three times a day (TID) | ORAL | 0 refills | Status: AC | PRN

## 2016-12-04 MED ORDER — TRAZODONE HCL 100 MG PO TABS: 100.0000 mg | ORAL_TABLET | Freq: Every evening | ORAL | 0 refills | Status: AC | PRN

## 2016-12-04 MED ORDER — CITALOPRAM HYDROBROMIDE 20 MG PO TABS: 20.0000 mg | ORAL_TABLET | Freq: Every day | ORAL | 0 refills | Status: AC

## 2016-12-04 MED ORDER — ALBUTEROL SULFATE HFA 108 (90 BASE) MCG/ACT IN AERS: 2.0000 | INHALATION_SPRAY | Freq: Four times a day (QID) | RESPIRATORY_TRACT | 0 refills | Status: AC | PRN

## 2016-12-04 MED ORDER — ARIPIPRAZOLE 15 MG PO TABS: 7.5000 mg | ORAL_TABLET | Freq: Two times a day (BID) | ORAL | 0 refills | Status: AC

## 2016-12-04 MED ORDER — OMEPRAZOLE 20 MG PO CPDR: 40.0000 mg | DELAYED_RELEASE_CAPSULE | Freq: Every day | ORAL | Status: AC

## 2016-12-04 NOTE — Progress Notes (Signed)
  [facility] :  Will you be returning to the same living situation after discharge:  Yes,  pt returning home. At discharge, do you have transportation home?: Yes,  Taxi voucher provided. Do you have the ability to pay for your medications: Yes,  pt has insurance.  Release of information consent forms completed and in the chart;  Patient's signature needed at discharge.  Patient to Follow up at: Follow-up Information    [facility] Follow up on 12/04/2016.   Specialty:  Behavioral Health Why:  Admissions paperwork appointment on July 5th at 12 Noon. Initial psychiatric assessment on 7/5 at 2 PM w NP or MD.  Please arrive on time.  Transitional Care Team services for case management assistance.  Services will begin on day of discharge.  Contact information: [address] [place] [address] [phone]        BEHAVIORAL HEALTH OUTPATIENT THERAPY [place] [facility] Follow up on 12/10/2016.   Specialty:  Behavioral Health Why:  Patient can begin Mental Health IOP program on 7/11 at 8:45.  This is the next available opening for this program.   Contact information: [address] [id number] [place] [id number] [phone]          Next level of care provider has access to [facility]:yes  Safety Planning and Suicide Prevention discussed: Yes,  with pt.  Have you used any form of tobacco in the last 30 days? (Cigarettes, Smokeless Tobacco, Cigars, and/or Pipes): Yes  Has patient been referred to the Quitline?: Patient refused referral  Patient has been referred for addiction treatment: Yes  Jonathon JordanLynn B Tonie Elsey, MSW, LCSWA 12/04/2016, 10:53 AM
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
## 2016-12-04 NOTE — Progress Notes (Signed)
D: Pt denies SI/HI/AVH. Pt is pleasant and cooperative. Pt stated he was doing better and he was feeling ready to leave.   A: Pt was offered support and encouragement. Pt was given scheduled medications. Pt was encourage to attend groups. Q 15 minute checks were done for safety.   R:Pt attends groups and interacts well with peers and staff. Pt is taking medication. Pt has no complaints.Pt receptive to treatment and safety maintained on unit.

## 2016-12-04 NOTE — Discharge Summary (Signed)
Physician Discharge Summary Note  Patient:  Billy [name] is an 56 y.o., male MRN:  [id number] DOB:  [birth year] Patient phone:  [phone] (home)  Patient address:   [address] [place] [address],   Total Time spent with patient: Greater than 30 minutes  Date of Admission:  11/26/2016  Date of Discharge: 12-05-15  Reason for Admission: Suicide attempt by hanging.  Principal Problem: MDD (major depressive disorder), recurrent severe, without psychosis [facility])  Discharge Diagnoses: Patient Active Problem List   Diagnosis Date Noted  . MDD (major depressive disorder), recurrent severe, without psychosis (HCC) [F33.2] 11/26/2016  . Suicide attempt (HCC) [T14.91XA] 11/25/2016  . Suicidal ideation [R45.851]   . Bipolar disorder, curr episode mixed, severe, w/o psychotic features (HCC) [F31.63] 05/17/2016  . Cocaine use disorder, severe, dependence (HCC) [F14.20] 05/17/2016  . Affective psychosis, bipolar (HCC) [F31.9]   . Bipolar affective disorder, depressed (HCC) [F31.30] 08/09/2015  . Bipolar disorder with current episode depressed (HCC) [F31.30] 08/09/2015  . Polysubstance abuse [F19.10]   . Type 2 diabetes mellitus with hyperglycemia (HCC) [E11.65] 09/19/2014  . Right leg DVT (HCC) [I82.401] 09/19/2014  . Essential hypertension [I10] 09/19/2014  . Bipolar affective disorder, depressed, severe, with psychotic behavior (HCC) [F31.5] 09/14/2014  . Chronic pain syndrome [G89.4] 09/14/2014  . Cocaine abuse [F14.10] 09/13/2014  . Alcohol use disorder, mild, abuse [F10.10] 10/12/2012  . Homeless [Z59.0] 09/02/2011  . Opiate abuse, episodic [F11.10] 09/02/2011  . Diabetes mellitus [250] 08/30/2011  . WPW (Wolff-Parkinson-White syndrome) [I45.6] 08/30/2011   Musculoskeletal: Strength & Muscle Tone: within normal limits Gait & Station: normal Patient leans: N/A  Psychiatric Specialty Exam: See Suicide Risk Assessment Physical Exam  Constitutional: He is oriented to  person, place, and time. He appears well-developed.  HENT:  Head: Normocephalic.  Eyes: Pupils are equal, round, and reactive to light.  Neck: Normal range of motion.  Cardiovascular: Normal rate.   Respiratory: Effort normal.  GI: Soft.  Genitourinary:  Genitourinary Comments: Deferred  Musculoskeletal: Normal range of motion.  Neurological: He is alert and oriented to person, place, and time.  Skin: Skin is warm and dry.    Review of Systems  Constitutional: Negative.   HENT: Negative.   Eyes: Negative.   Respiratory: Negative.   Cardiovascular: Negative.   Gastrointestinal: Negative.   Genitourinary: Negative.   Musculoskeletal: Negative.   Skin: Negative.   Neurological: Negative.   Endo/Heme/Allergies: Negative.   Psychiatric/Behavioral: Positive for depression (Stable) and substance abuse (Hx. Cocaine dependence, alcohol use disorder). Negative for hallucinations, memory loss and suicidal ideas. The patient has insomnia (Stable). The patient is not nervous/anxious.     Blood pressure (!) 147/94, pulse 91, temperature 97.9 F (36.6 C), temperature source Oral, resp. rate 18, height 5\' 9"  (1.753 m), weight 85.7 kg (189 lb).Body mass index is 27.91 kg/m.  See Md's SRA  Past Medical History:  Past Medical History:  Diagnosis Date  . Bipolar 1 disorder (HCC)   . Chronic pain   . Depression   . Diabetes mellitus   . Diabetes mellitus without complication (HCC)   . HCV (hepatitis C virus)   . Hepatitis C carrier (HCC)   . Homeless   . Hypertension   . Suicide attempt (HCC)   . WPW (Wolff-Parkinson-White syndrome)     Past Surgical History:  Procedure Laterality Date  . BACK SURGERY    . CHEST SURGERY    . RHINOPLASTY    . TONSILLECTOMY     Family  History:  Family History  Problem Relation Age of Onset  . Mental illness Neg Hx    Social History:  History  Alcohol Use  . Yes    Comment: last drink 24 days     History  Drug Use  . Types: Marijuana,  "Crack" cocaine, Cocaine    Comment: last used 09/03/2014    Social History   Social History  . Marital status: Divorced    Spouse name: N/A  . Number of children: N/A  . Years of education: N/A   Social History Main Topics  . Smoking status: Current Every Day Smoker    Packs/day: 1.00    Years: 8.00    Types: Cigarettes  . Smokeless tobacco: Never Used  . Alcohol use Yes     Comment: last drink 24 days  . Drug use: Yes    Types: Marijuana, "Crack" cocaine, Cocaine     Comment: last used 09/03/2014  . Sexual activity: Not Asked   Other Topics Concern  . None   Social History Narrative   ** Merged History Encounter **       Risk to Self: No Risk to Others: No Prior Inpatient Therapy: Yes ([facility] x multiple times) Prior Outpatient Therapy: Yes [facility])  Level of Care:  [facility]  Hospital Course:  [name], single, unemployed, lives alone. Background history of Mood Disorder, SUD and medical comorbidity as above. Was brought to the ER by emergency services. Called 911 himself after he attempted to hang himself. Reported to have passed out while attempting to hang himself. Did not get as far as putting the noose around his neck  This is one of several discharge summaries for Billy [name] from this [facility] hospital alone. He was admitted to the [facility] adult unit for crisis management due to worsening symptoms of depression triggering suicidal ideations & attempt by hanging. During his admission assessment, Billy [name] was evaluated and his symptoms were identified. The medication regimen were discussed & initiated targeting those presenting symptoms. He was oriented to the unit and encouraged to participate in the unit programming. His other pre-existing medical problems were identified and treated appropriately by resuming his pertinent home medication for those health issues.   During his hospital stay, Billy [name] was evaluated daily by a clinical provider to ascertain his response to his treatment  regimen. As his treatment progressed, improvement was noted as evidenced by his reports of decreasing symptoms, improved mood, medication tolerance & participation in the unit programming. He was required on daily basis to complete a self inventory asssessment noting mood, any new symptoms, anxiety or concerns. His symptoms responded well to his treatment regimen, being in a therapeutic and supportive environment also assisted in his mood stability.   On this day of his hospital discharge, Shakai was in much improved condition than upon admission. His symptoms were reported as significantly decreased or resolved completely. Upon discharge, he denies SIHI & voiced no AVH. He was motivated to continue taking medication with a goal of continued improvement in mental health. He was medicated & discharge on; Abilify 15 mg for mood stabilization, Gabapentin 300 mg for agitation, Citalopram 20 mg for depression, Hydroxyzine 50 mg prn for anxiety/insomnia & Trazodone 100 mg for insomnia. He tolerated his treatment regimen without problems. Magic will follow-up care as noted below. He left [facility] with all belongings in no apparent distress. Transportation per taxi, [facility] assisted with bus pass.  Consults:  psychiatry  Discharge Vitals:   Blood pressure (!) 147/94,  pulse 91, temperature 97.9 F (36.6 C), temperature source Oral, resp. rate 18, height 5\' 9"  (1.753 m), weight 85.7 kg (189 lb). Body mass index is 27.91 kg/m.  Lab Results:   Results for orders placed or performed during the hospital encounter of 11/26/16 (from the past 72 hour(s))  Glucose, capillary     Status: Abnormal   Collection Time: 12/01/16 11:59 AM  Result Value Ref Range   Glucose-Capillary 170 (H) 65 - 99 mg/dL  Glucose, capillary     Status: Abnormal   Collection Time: 12/01/16  5:01 PM  Result Value Ref Range   Glucose-Capillary 142 (H) 65 - 99 mg/dL  Glucose, capillary     Status: Abnormal   Collection Time: 12/01/16  8:54 PM   Result Value Ref Range   Glucose-Capillary 172 (H) 65 - 99 mg/dL  Glucose, capillary     Status: Abnormal   Collection Time: 12/02/16  5:52 AM  Result Value Ref Range   Glucose-Capillary 108 (H) 65 - 99 mg/dL  Glucose, capillary     Status: Abnormal   Collection Time: 12/02/16 12:02 PM  Result Value Ref Range   Glucose-Capillary 145 (H) 65 - 99 mg/dL   Comment 1 Notify RN    Comment 2 Document in Chart   Glucose, capillary     Status: Abnormal   Collection Time: 12/02/16  5:11 PM  Result Value Ref Range   Glucose-Capillary 144 (H) 65 - 99 mg/dL   Comment 1 Notify RN    Comment 2 Document in Chart   Glucose, capillary     Status: Abnormal   Collection Time: 12/02/16  8:23 PM  Result Value Ref Range   Glucose-Capillary 172 (H) 65 - 99 mg/dL  Glucose, capillary     Status: Abnormal   Collection Time: 12/03/16  6:09 AM  Result Value Ref Range   Glucose-Capillary 62 (L) 65 - 99 mg/dL   Comment 1 Notify RN   Glucose, capillary     Status: Abnormal   Collection Time: 12/03/16  6:26 AM  Result Value Ref Range   Glucose-Capillary 109 (H) 65 - 99 mg/dL   Comment 1 Notify RN   Glucose, capillary     Status: Abnormal   Collection Time: 12/03/16 11:59 AM  Result Value Ref Range   Glucose-Capillary 102 (H) 65 - 99 mg/dL  Glucose, capillary     Status: Abnormal   Collection Time: 12/03/16  4:23 PM  Result Value Ref Range   Glucose-Capillary 155 (H) 65 - 99 mg/dL  Glucose, capillary     Status: Abnormal   Collection Time: 12/03/16  8:45 PM  Result Value Ref Range   Glucose-Capillary 174 (H) 65 - 99 mg/dL  Glucose, capillary     Status: Abnormal   Collection Time: 12/04/16  6:31 AM  Result Value Ref Range   Glucose-Capillary 144 (H) 65 - 99 mg/dL   Physical Findings:  AIMS: Facial and Oral Movements Muscles of Facial Expression: None, normal Lips and Perioral Area: None, normal Jaw: None, normal Tongue: None, normal,Extremity Movements Upper (arms, wrists, hands, fingers):  None, normal Lower (legs, knees, ankles, toes): None, normal, Trunk Movements Neck, shoulders, hips: None, normal, Overall Severity Severity of abnormal movements (highest score from questions above): None, normal Incapacitation due to abnormal movements: None, normal Patient's awareness of abnormal movements (rate only patient's report): No Awareness, Dental Status Current problems with teeth and/or dentures?: No Does patient usually wear dentures?: No  CIWA:    COWS:  See Psychiatric Specialty Exam and Suicide Risk Assessment completed by Attending Physician prior to discharge.  Discharge destination:  Home  Is patient on multiple antipsychotic therapies at discharge:  No   Has Patient had three or more failed trials of antipsychotic monotherapy by history:  No  Recommended Plan for Multiple Antipsychotic Therapies: NA  Allergies as of 12/04/2016   No Known Allergies     Medication List    STOP taking these medications   lisinopril 20 MG tablet Commonly known as:  PRINIVIL,ZESTRIL   oxymetazoline 0.05 % nasal spray Commonly known as:  AFRIN   XARELTO 20 MG Tabs tablet Generic drug:  rivaroxaban     TAKE these medications     Indication  albuterol 108 (90 Base) MCG/ACT inhaler Commonly known as:  PROAIR HFA Inhale 2 puffs into the lungs every 6 (six) hours as needed for shortness of breath.  Indication:  Asthma, Chronic Obstructive Lung Disease   ARIPiprazole 15 MG tablet Commonly known as:  ABILIFY Take 0.5 tablets (7.5 mg total) by mouth 2 (two) times daily. For mood control  Indication:  Mood control   citalopram 20 MG tablet Commonly known as:  CELEXA Take 1 tablet (20 mg total) by mouth daily. For depression  Indication:  Depression   cloNIDine 0.2 MG tablet Commonly known as:  CATAPRES Take 1 tablet (0.2 mg total) by mouth 2 (two) times daily. For high blood pressure  Indication:  High Blood Pressure Disorder   clotrimazole 1 % cream Commonly known  as:  LOTRIMIN Apply topically 2 (two) times daily. For athletic foot  Indication:  Athlete's Foot   fluticasone 50 MCG/ACT nasal spray Commonly known as:  FLONASE Place 1 spray into both nostrils 2 (two) times daily. For allergies  Indication:  Allergic Rhinitis   gabapentin 300 MG capsule Commonly known as:  NEURONTIN Take 1 capsule (300 mg total) by mouth 3 (three) times daily. For agitation What changed:  medication strength  how much to take  Indication:  Agitation   hydrOXYzine 50 MG tablet Commonly known as:  ATARAX/VISTARIL Take 1 tablet (50 mg total) by mouth 3 (three) times daily as needed for anxiety. What changed:  when to take this  reasons to take this  additional instructions  Indication:  Anxiety Neurosis, Anxiety/insomnia   ibuprofen 800 MG tablet Commonly known as:  ADVIL,MOTRIN Take 1 tablet (800 mg total) by mouth every 6 (six) hours as needed for moderate pain.  Indication:  Mild to Moderate Pain   insulin glargine 100 UNIT/ML injection Commonly known as:  LANTUS Inject 0.6 mLs (60 Units total) into the skin at bedtime. For diabetes managment  Indication:  Type 2 Diabetes   INVOKANA 300 MG Tabs tablet Generic drug:  canagliflozin Take 1 tablet (300 mg total) by mouth daily. For diabetes management  Indication:  Type 2 Diabetes   nicotine 21 mg/24hr patch Commonly known as:  NICODERM CQ - dosed in mg/24 hours Place 1 patch (21 mg total) onto the skin daily at 6 (six) AM. For smoking cessation  Indication:  Nicotine Addiction   omeprazole 20 MG capsule Commonly known as:  PRILOSEC Take 2 capsules (40 mg total) by mouth daily. For acid reflux  Indication:  Gastroesophageal Reflux Disease   tiotropium 18 MCG inhalation capsule Commonly known as:  SPIRIVA Place 1 capsule (18 mcg total) into inhaler and inhale daily. For COPD  Indication:  Chronic Obstructive Lung Disease   tiZANidine 2 MG tablet Commonly known as:  ZANAFLEX Take 2 tablets  (4 mg total) by mouth 3 (three) times daily. For pain  Indication:  Muscle Spasticity   traZODone 100 MG tablet Commonly known as:  DESYREL Take 1 tablet (100 mg total) by mouth at bedtime as needed for sleep. What changed:  how much to take  when to take this  reasons to take this  additional instructions  Indication:  Trouble Sleeping      Follow-up Information    [facility] Follow up on 12/04/2016.   Specialty:  Behavioral Health Why:  Admissions paperwork appointment on July 5th at 12 Noon. Initial psychiatric assessment on 7/5 at 2 PM w NP or MD.  Please arrive on time.  Transitional Care Team services for case management assistance.  Services will begin on day of discharge.  Contact information: [address] [place] [address] [phone]        BEHAVIORAL HEALTH OUTPATIENT THERAPY [facility] Follow up on 12/10/2016.   Specialty:  Behavioral Health Why:  Patient can begin Mental Health IOP program on 7/11 at 8:45.  This is the next available opening for this program.   Contact information: [address] [id number] [address]c [place] [id number] [phone]         Follow-up recommendations: Activity:  As tolerated Diet: As recommended by your primary care doctor. Keep all scheduled follow-up appointments as recommended.   Comments:  Take all your medications as prescribed by your mental healthcare provider. Report any adverse effects and or reactions from your medicines to your outpatient provider promptly. Patient is instructed and cautioned to not engage in alcohol and or illegal drug use while on prescription medicines. In the event of worsening symptoms, patient is instructed to call the crisis hotline, 911 and or go to the nearest ED for appropriate evaluation and treatment of symptoms. Follow-up with your primary care provider for your other medical issues, concerns and or health care needs.    Signed: Sanjuana KavaNwoko, Divine Imber I, FNP,  PMHNP-BC 12/04/2016, 9:56 AM

## 2019-03-03 DEATH — deceased

## 2019-05-26 IMAGING — CT CT FOOT*R* W/O CM
3 of 4 series · 12 of 27 positions shown, 14 images · non-contrast
Comparison: None.

CLINICAL DATA: He attempted to fashion a noose and hang himself but
when he climbed up on the chair (before he put the noose on) he had
another syncopal event and fell.

EXAM:
CT OF THE RIGHT FOOT WITHOUT CONTRAST
TECHNIQUE: Multidetector CT imaging of the right foot was performed according
to the standard protocol. Multiplanar CT image reconstructions were
also generated.

[Series 3: foot/ankle bone windows · axial · 0.51mm/px · z∈[+332,+442]mm · 4 of 93 slices shown, 5 images]
[im 19/93  soft-tissue]
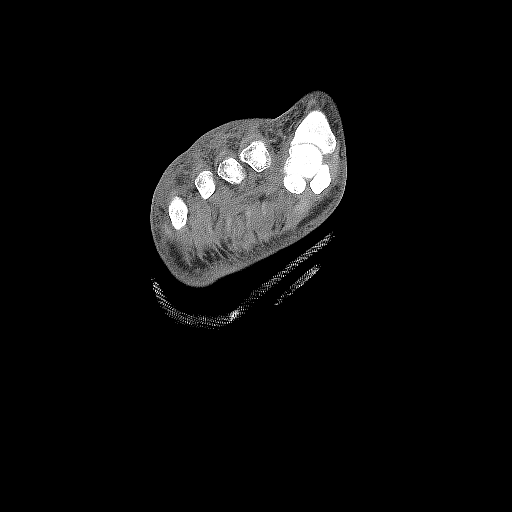
[im 19/93  bone]
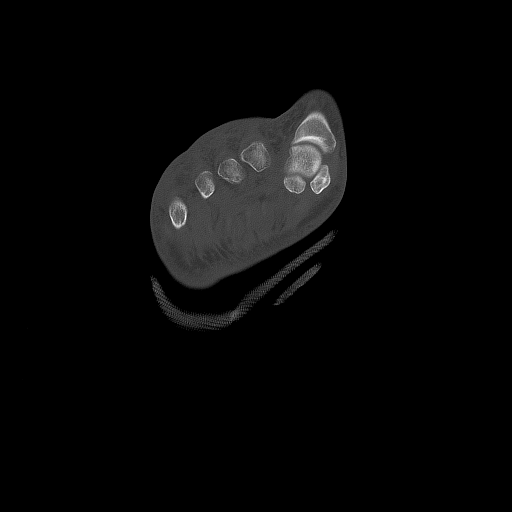
[im 37/93  bone]
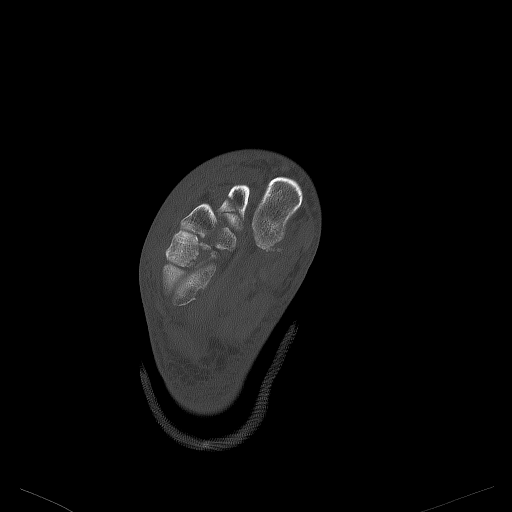
[im 56/93  bone]
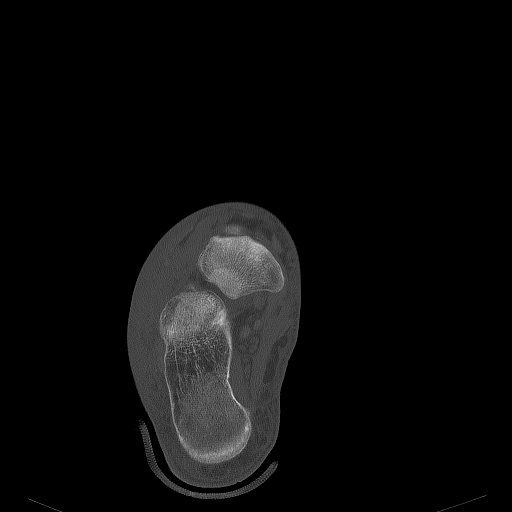
[im 74/93  bone]
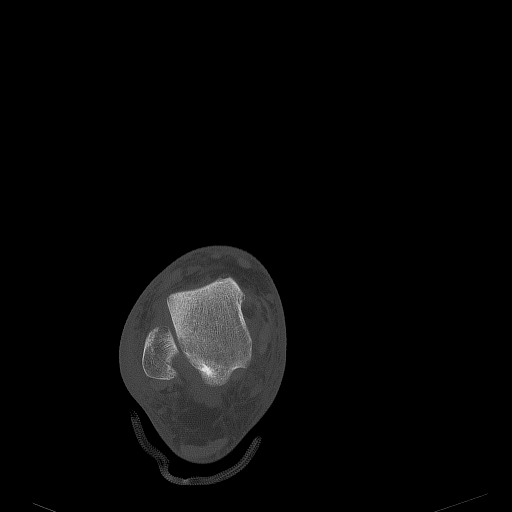

[Series 4: foot/ankle st · axial · 0.51mm/px · z∈[+342,+434]mm · 3 of 93 slices shown]
[im 24/93  bone]
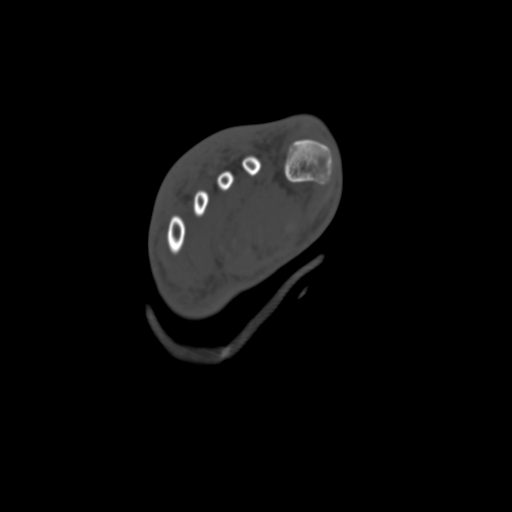
[im 47/93  bone]
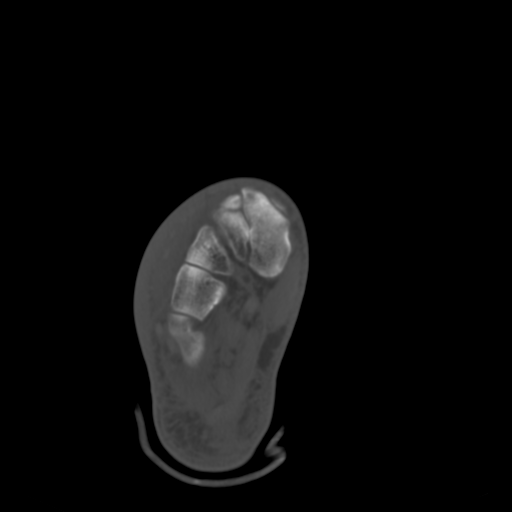
[im 70/93  bone]
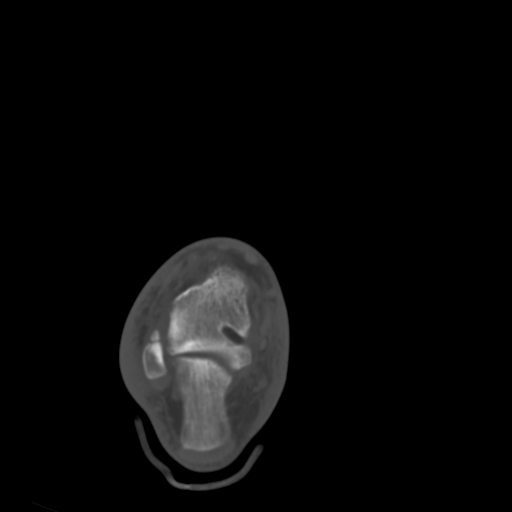

[Series 6: sagittal bone · sagittal · 0.41mm/px · 5 of 47 slices shown, 6 images]
[im 16/47  bone]
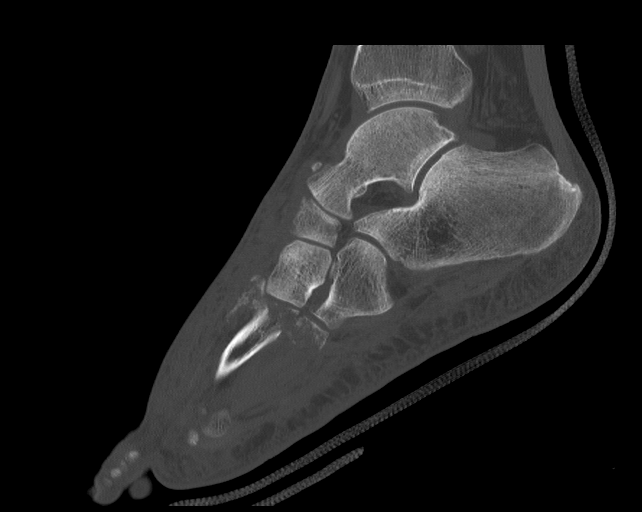
[im 20/47  bone]
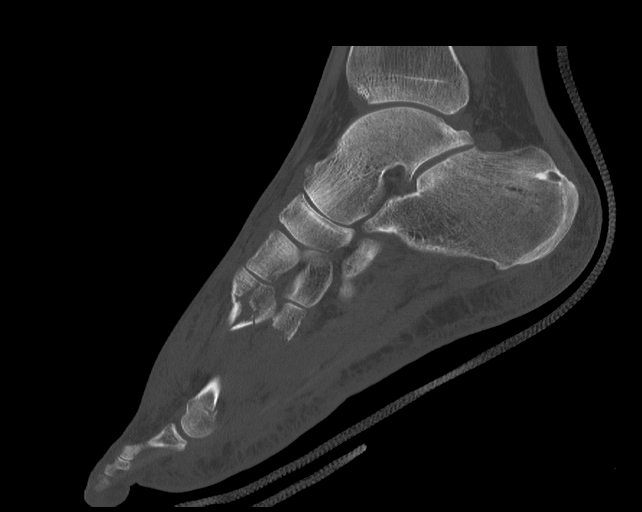
[im 24/47  soft-tissue]
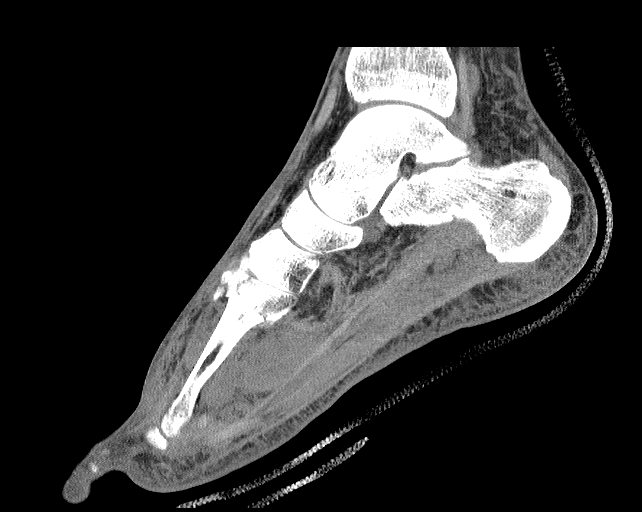
[im 24/47  bone]
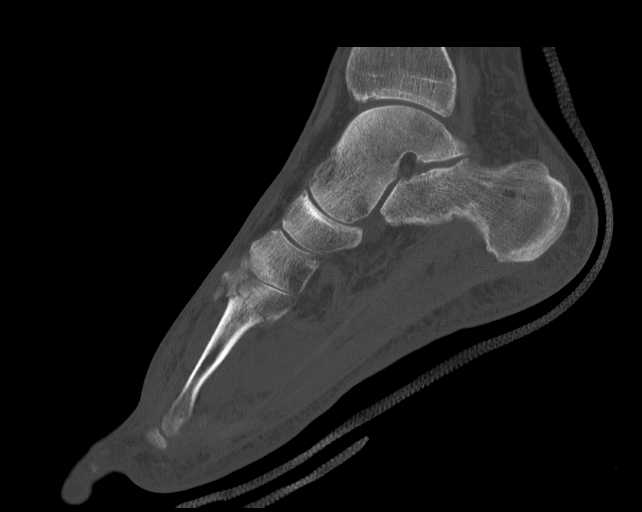
[im 27/47  bone]
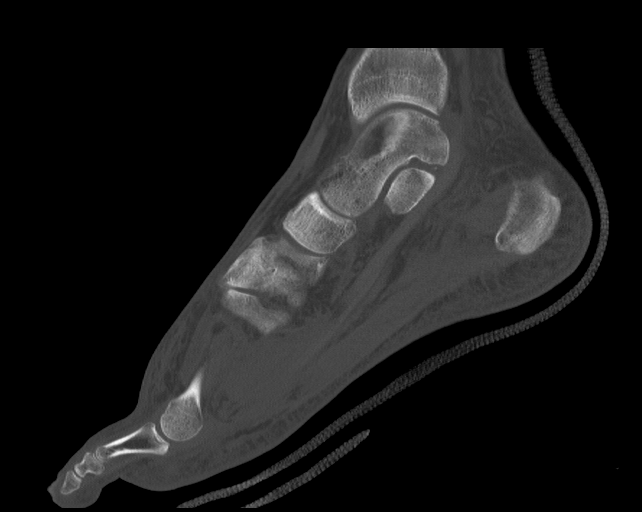
[im 31/47  bone]
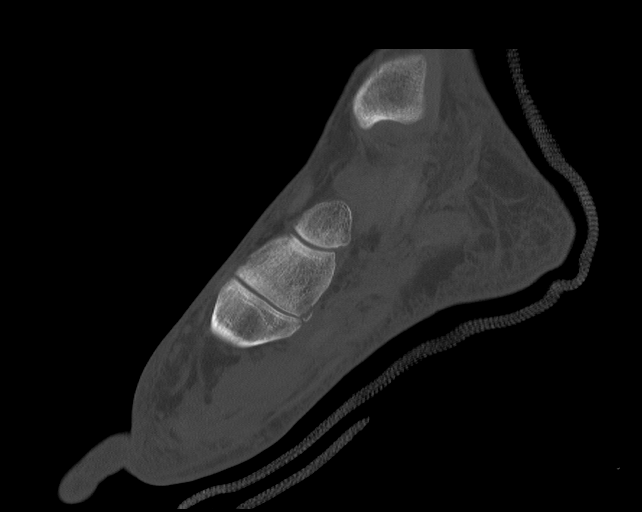

[12 of 27 positions shown; findings below may reference images not displayed]

FINDINGS: Bones/Joint/Cartilage

Acute nondisplaced fracture of the dorsal anterior medial cuneiform.
Small nondisplaced fracture along the distal plantar aspect of the
medial cuneiform. Minimally displaced fracture of the proximal
plantar base of the first metatarsal.

Comminuted fracture of the base of the second metatarsal involving
the articular surface.

Severely comminuted fracture of the base of the third metatarsal
with 10 mm of distraction both medially hand laterally.

Comminuted fracture of the base of the fourth metatarsal without
significant displacement.

Nondisplaced fracture of the distal lateral plantar aspect of the
cuboid.

Nondisplaced fracture along the distal dorsal aspect of the talus
likely reflecting an avulsion fracture.

Small bony fragment adjacent to the lateral malleolus likely
reflecting sequela of avulsive injury.

Nondisplaced fracture of the third metatarsal neck.

Ankle mortise is intact.

Lisfranc joint is congruent.

No aggressive osseous lesion.

Ligaments

Ligaments are suboptimally evaluated by CT.

Muscles and Tendons
Muscles are normal. Flexor, extensor, peroneal and Achilles tendons
are grossly intact.

Soft tissue
No fluid collection or hematoma. No soft tissue mass. Severe soft
tissue edema involving the foot.
IMPRESSION: 1. Acute nondisplaced fracture of the dorsal anterior medial
cuneiform. Small nondisplaced fracture along the distal plantar
aspect of the medial cuneiform. Minimally displaced fracture of the
proximal plantar base of the first metatarsal.
2. Comminuted fracture of the base of the second metatarsal
involving the articular surface.
3. Severely comminuted fracture of the base of the third metatarsal
with 10 mm of distraction both medially hand laterally.
4. Comminuted fracture of the base of the fourth metatarsal without
significant displacement.
5. Nondisplaced fracture of the distal lateral plantar aspect of the
cuboid.
6. Nondisplaced fracture along the distal dorsal aspect of the talus
likely reflecting an avulsion fracture.
7. Small bony fragment adjacent to the lateral malleolus likely
reflecting sequela of avulsive injury.
8. Lisfranc joint is congruent.
